# Patient Record
Sex: Male | Born: 1935 | Race: White | Hispanic: No | Smoking: Never smoker
Health system: Southern US, Community
[De-identification: ages and names within clinical notes are randomized; demographics above are authoritative.]

## PROBLEM LIST (undated history)

## (undated) DIAGNOSIS — N289 Disorder of kidney and ureter, unspecified: Secondary | ICD-10-CM

## (undated) DIAGNOSIS — E039 Hypothyroidism, unspecified: Secondary | ICD-10-CM

## (undated) DIAGNOSIS — I10 Essential (primary) hypertension: Secondary | ICD-10-CM

## (undated) DIAGNOSIS — E569 Vitamin deficiency, unspecified: Secondary | ICD-10-CM

## (undated) DIAGNOSIS — N183 Chronic kidney disease, stage 3 unspecified: Secondary | ICD-10-CM

## (undated) DIAGNOSIS — K219 Gastro-esophageal reflux disease without esophagitis: Secondary | ICD-10-CM

## (undated) DIAGNOSIS — H9192 Unspecified hearing loss, left ear: Secondary | ICD-10-CM

## (undated) DIAGNOSIS — G629 Polyneuropathy, unspecified: Secondary | ICD-10-CM

## (undated) DIAGNOSIS — D631 Anemia in chronic kidney disease: Secondary | ICD-10-CM

## (undated) DIAGNOSIS — N400 Enlarged prostate without lower urinary tract symptoms: Secondary | ICD-10-CM

## (undated) DIAGNOSIS — G43909 Migraine, unspecified, not intractable, without status migrainosus: Secondary | ICD-10-CM

## (undated) DIAGNOSIS — M4800 Spinal stenosis, site unspecified: Secondary | ICD-10-CM

## (undated) DIAGNOSIS — D649 Anemia, unspecified: Secondary | ICD-10-CM

## (undated) DIAGNOSIS — N200 Calculus of kidney: Secondary | ICD-10-CM

## (undated) DIAGNOSIS — N189 Chronic kidney disease, unspecified: Secondary | ICD-10-CM

## (undated) DIAGNOSIS — D473 Essential (hemorrhagic) thrombocythemia: Secondary | ICD-10-CM

## (undated) DIAGNOSIS — H353190 Nonexudative age-related macular degeneration, unspecified eye, stage unspecified: Secondary | ICD-10-CM

## (undated) DIAGNOSIS — K5792 Diverticulitis of intestine, part unspecified, without perforation or abscess without bleeding: Secondary | ICD-10-CM

## (undated) DIAGNOSIS — E785 Hyperlipidemia, unspecified: Secondary | ICD-10-CM

## (undated) DIAGNOSIS — R011 Cardiac murmur, unspecified: Secondary | ICD-10-CM

## (undated) HISTORY — DX: Polyneuropathy, unspecified: G62.9

## (undated) HISTORY — DX: Spinal stenosis, site unspecified: M48.00

## (undated) HISTORY — DX: Disorder of kidney and ureter, unspecified: N28.9

## (undated) HISTORY — DX: Chronic kidney disease, stage 3 (moderate): N18.3

## (undated) HISTORY — DX: Chronic kidney disease, stage 3 unspecified: N18.30

## (undated) HISTORY — DX: Anemia, unspecified: D64.9

## (undated) HISTORY — DX: Essential (hemorrhagic) thrombocythemia: D47.3

## (undated) HISTORY — DX: Hypothyroidism, unspecified: E03.9

## (undated) HISTORY — DX: Benign prostatic hyperplasia without lower urinary tract symptoms: N40.0

## (undated) HISTORY — DX: Hyperlipidemia, unspecified: E78.5

## (undated) HISTORY — DX: Essential (primary) hypertension: I10

## (undated) HISTORY — DX: Unspecified hearing loss, left ear: H91.92

## (undated) HISTORY — DX: Anemia in chronic kidney disease: D63.1

## (undated) HISTORY — PX: NASAL CONCHA BULLOSA RESECTION: SHX2062

## (undated) HISTORY — DX: Cardiac murmur, unspecified: R01.1

## (undated) HISTORY — DX: Nonexudative age-related macular degeneration, unspecified eye, stage unspecified: H35.3190

## (undated) HISTORY — DX: Vitamin deficiency, unspecified: E56.9

## (undated) HISTORY — DX: Calculus of kidney: N20.0

## (undated) HISTORY — DX: Chronic kidney disease, unspecified: N18.9

## (undated) HISTORY — DX: Migraine, unspecified, not intractable, without status migrainosus: G43.909

## (undated) HISTORY — DX: Diverticulitis of intestine, part unspecified, without perforation or abscess without bleeding: K57.92

## (undated) HISTORY — DX: Gastro-esophageal reflux disease without esophagitis: K21.9

## (undated) HISTORY — PX: INGUINAL HERNIA REPAIR: SUR1180

---
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
## 1990-05-05 HISTORY — PX: LAPAROSCOPIC CHOLECYSTECTOMY: SUR755

## 1994-05-05 HISTORY — PX: CORONARY ARTERY BYPASS GRAFT: SHX141

## 1999-09-16 ENCOUNTER — Ambulatory Visit (HOSPITAL_COMMUNITY): Admission: RE | Admit: 1999-09-16 | Discharge: 1999-09-16 | Payer: Self-pay | Admitting: Gastroenterology

## 2003-02-24 ENCOUNTER — Ambulatory Visit (HOSPITAL_COMMUNITY): Admission: RE | Admit: 2003-02-24 | Discharge: 2003-02-24 | Payer: Self-pay | Admitting: Orthopaedic Surgery

## 2003-02-24 ENCOUNTER — Encounter: Payer: Self-pay | Admitting: Orthopaedic Surgery

## 2003-03-01 ENCOUNTER — Encounter: Admission: RE | Admit: 2003-03-01 | Discharge: 2003-03-01 | Payer: Self-pay | Admitting: Orthopaedic Surgery

## 2004-08-22 ENCOUNTER — Encounter: Admission: RE | Admit: 2004-08-22 | Discharge: 2004-08-22 | Payer: Self-pay | Admitting: Otolaryngology

## 2006-06-24 ENCOUNTER — Ambulatory Visit (HOSPITAL_COMMUNITY): Admission: RE | Admit: 2006-06-24 | Discharge: 2006-06-24 | Payer: Self-pay | Admitting: Gastroenterology

## 2006-08-26 ENCOUNTER — Ambulatory Visit (HOSPITAL_COMMUNITY): Admission: RE | Admit: 2006-08-26 | Discharge: 2006-08-26 | Payer: Self-pay | Admitting: Internal Medicine

## 2006-12-24 ENCOUNTER — Encounter (INDEPENDENT_AMBULATORY_CARE_PROVIDER_SITE_OTHER): Payer: Self-pay | Admitting: Surgery

## 2006-12-24 ENCOUNTER — Ambulatory Visit (HOSPITAL_BASED_OUTPATIENT_CLINIC_OR_DEPARTMENT_OTHER): Admission: RE | Admit: 2006-12-24 | Discharge: 2006-12-24 | Payer: Self-pay | Admitting: Surgery

## 2007-10-14 ENCOUNTER — Encounter: Admission: RE | Admit: 2007-10-14 | Discharge: 2008-01-12 | Payer: Self-pay | Admitting: Internal Medicine

## 2007-10-28 ENCOUNTER — Ambulatory Visit (HOSPITAL_COMMUNITY): Admission: RE | Admit: 2007-10-28 | Discharge: 2007-10-28 | Payer: Self-pay | Admitting: Internal Medicine

## 2008-06-19 ENCOUNTER — Encounter: Admission: RE | Admit: 2008-06-19 | Discharge: 2008-06-19 | Payer: Self-pay | Admitting: Neurology

## 2008-07-25 HISTORY — PX: TRANSTHORACIC ECHOCARDIOGRAM: SHX275

## 2009-07-23 ENCOUNTER — Encounter: Admission: RE | Admit: 2009-07-23 | Discharge: 2009-07-23 | Payer: Self-pay | Admitting: Gastroenterology

## 2009-08-09 ENCOUNTER — Encounter: Admission: RE | Admit: 2009-08-09 | Discharge: 2009-08-09 | Payer: Self-pay | Admitting: Nephrology

## 2009-08-14 ENCOUNTER — Ambulatory Visit: Payer: Self-pay | Admitting: Oncology

## 2009-08-22 ENCOUNTER — Encounter: Admission: RE | Admit: 2009-08-22 | Discharge: 2009-08-22 | Payer: Self-pay | Admitting: Urology

## 2009-10-08 ENCOUNTER — Ambulatory Visit (HOSPITAL_COMMUNITY): Admission: RE | Admit: 2009-10-08 | Discharge: 2009-10-08 | Payer: Self-pay | Admitting: Interventional Radiology

## 2009-10-17 ENCOUNTER — Encounter (INDEPENDENT_AMBULATORY_CARE_PROVIDER_SITE_OTHER): Payer: Self-pay | Admitting: Interventional Radiology

## 2009-10-17 ENCOUNTER — Ambulatory Visit (HOSPITAL_COMMUNITY): Admission: RE | Admit: 2009-10-17 | Discharge: 2009-10-18 | Payer: Self-pay | Admitting: Interventional Radiology

## 2009-10-19 ENCOUNTER — Ambulatory Visit: Payer: Self-pay | Admitting: Oncology

## 2009-11-26 ENCOUNTER — Ambulatory Visit (HOSPITAL_COMMUNITY): Admission: RE | Admit: 2009-11-26 | Discharge: 2009-11-26 | Payer: Self-pay | Admitting: Interventional Radiology

## 2009-11-28 ENCOUNTER — Encounter: Admission: RE | Admit: 2009-11-28 | Discharge: 2009-11-28 | Payer: Self-pay | Admitting: Interventional Radiology

## 2010-02-14 HISTORY — PX: NM MYOCAR PERF WALL MOTION: HXRAD629

## 2010-02-18 ENCOUNTER — Encounter: Admission: RE | Admit: 2010-02-18 | Discharge: 2010-02-18 | Payer: Self-pay | Admitting: Cardiovascular Disease

## 2010-02-19 ENCOUNTER — Ambulatory Visit (HOSPITAL_COMMUNITY): Admission: RE | Admit: 2010-02-19 | Discharge: 2010-02-19 | Payer: Self-pay | Admitting: Cardiovascular Disease

## 2010-02-19 HISTORY — PX: CARDIAC CATHETERIZATION: SHX172

## 2010-04-04 HISTORY — PX: OTHER SURGICAL HISTORY: SHX169

## 2010-05-25 ENCOUNTER — Encounter: Payer: Self-pay | Admitting: Orthopaedic Surgery

## 2010-05-25 ENCOUNTER — Encounter: Payer: Self-pay | Admitting: Radiation Oncology

## 2010-05-26 ENCOUNTER — Encounter: Payer: Self-pay | Admitting: Interventional Radiology

## 2010-07-17 LAB — POCT I-STAT 3, ART BLOOD GAS (G3+)
Bicarbonate: 23.4 mEq/L (ref 20.0–24.0)
pCO2 arterial: 40.4 mmHg (ref 35.0–45.0)
pH, Arterial: 7.371 (ref 7.350–7.450)
pO2, Arterial: 62 mmHg — ABNORMAL LOW (ref 80.0–100.0)

## 2010-07-17 LAB — POCT I-STAT 3, VENOUS BLOOD GAS (G3P V)
Acid-base deficit: 1 mmol/L (ref 0.0–2.0)
Bicarbonate: 24.8 mEq/L — ABNORMAL HIGH (ref 20.0–24.0)
O2 Saturation: 62 %
pCO2, Ven: 44.5 mmHg — ABNORMAL LOW (ref 45.0–50.0)
pH, Ven: 7.354 — ABNORMAL HIGH (ref 7.250–7.300)
pO2, Ven: 34 mmHg (ref 30.0–45.0)

## 2010-07-21 LAB — CBC
MCHC: 34.8 g/dL (ref 30.0–36.0)
MCV: 91.8 fL (ref 78.0–100.0)
Platelets: 329 10*3/uL (ref 150–400)
RDW: 13.1 % (ref 11.5–15.5)

## 2010-07-21 LAB — CROSSMATCH: Antibody Screen: NEGATIVE

## 2010-07-21 LAB — BASIC METABOLIC PANEL
Chloride: 108 mEq/L (ref 96–112)
Creatinine, Ser: 1.49 mg/dL (ref 0.4–1.5)
GFR calc Af Amer: 54 mL/min — ABNORMAL LOW (ref >60)
Potassium: 4.5 mEq/L (ref 3.5–5.1)

## 2010-07-22 LAB — CROSSMATCH: Antibody Screen: NEGATIVE

## 2010-07-22 LAB — BASIC METABOLIC PANEL
Calcium: 9.5 mg/dL (ref 8.4–10.5)
Chloride: 109 mEq/L (ref 96–112)
Creatinine, Ser: 1.38 mg/dL (ref 0.4–1.5)
GFR calc non Af Amer: 49 mL/min — ABNORMAL LOW (ref >60)
Glucose, Bld: 99 mg/dL (ref 70–99)
Potassium: 4.5 mEq/L (ref 3.5–5.1)
Sodium: 142 mEq/L (ref 135–145)

## 2010-07-22 LAB — CBC
Hemoglobin: 14.7 g/dL (ref 13.0–17.0)
RBC: 4.68 MIL/uL (ref 4.22–5.81)
RDW: 13.9 % (ref 11.5–15.5)
WBC: 6.6 10*3/uL (ref 4.0–10.5)

## 2010-07-22 LAB — ABO/RH: ABO/RH(D): A POS

## 2010-08-30 ENCOUNTER — Other Ambulatory Visit: Payer: Self-pay | Admitting: Neurology

## 2010-08-30 DIAGNOSIS — W19XXXA Unspecified fall, initial encounter: Secondary | ICD-10-CM

## 2010-08-30 DIAGNOSIS — G722 Myopathy due to other toxic agents: Secondary | ICD-10-CM

## 2010-08-30 DIAGNOSIS — I25810 Atherosclerosis of coronary artery bypass graft(s) without angina pectoris: Secondary | ICD-10-CM

## 2010-08-30 DIAGNOSIS — I639 Cerebral infarction, unspecified: Secondary | ICD-10-CM

## 2010-09-04 ENCOUNTER — Ambulatory Visit
Admission: RE | Admit: 2010-09-04 | Discharge: 2010-09-04 | Disposition: A | Discharge status: Home / Self Care | Payer: Medicare Other | Source: Ambulatory Visit | Attending: Neurology | Admitting: Neurology

## 2010-09-04 DIAGNOSIS — I25810 Atherosclerosis of coronary artery bypass graft(s) without angina pectoris: Secondary | ICD-10-CM

## 2010-09-04 DIAGNOSIS — W19XXXA Unspecified fall, initial encounter: Secondary | ICD-10-CM

## 2010-09-04 DIAGNOSIS — G722 Myopathy due to other toxic agents: Secondary | ICD-10-CM

## 2010-09-17 NOTE — Op Note (Signed)
NAME:  Alexander [name], Alexander [name] NO.:  [id number]   MEDICAL RECORD NO.:  [id number]          PATIENT TYPE:  AMB   LOCATION:  DSC                          FACILITY:  [facility]   PHYSICIAN:  Fenton Malling. Lucia Gaskins, M.D.  DATE OF BIRTH:  12/24/1921   DATE OF PROCEDURE:  12/24/2006  DATE OF DISCHARGE:                               OPERATIVE REPORT   PREOPERATIVE DIAGNOSES:  1. An 8-mm lesion on the left neck.  2. A 2-cm lipoma, left upper back.  3. A 2-cm sebaceous cyst below lipoma about 6 cm.   POSTOPERATIVE DIAGNOSES:  1. An 8-mm lesion on the left neck.  2. A 2-cm lipoma, left upper back.  3. A 2-cm sebaceous cyst below lipoma about 6 cm.   PROCEDURE:  Excision of lesions; left neck, left upper back, left mid  back.   SURGEON:  Fenton Malling. Lucia Gaskins, M.D.   ANESTHESIA:  Xylocaine 1% 12 mL with epinephrine.   COMPLICATIONS:  None.   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  He now comes for  excision of these lesions.   OPERATIVE NOTE:  Patient in the prone position, his back prepped with Betadine solution  and sterilely draped, I infiltrated all 3 lesions using a total of about  12 mL of 1% Xylocaine.  I excised these lesions in turn in its entirety.  I closed the lesions with 3-0 nylon sutures using interrupted vertical  mattress suture.  Each wound was then dressed.   The patient will see me back in 8-10 days for suture removal, call for  any interval problems.  He is to take some Tylenol or aspirin at home  for pain.      Fenton Malling. Lucia Gaskins, M.D.  Electronically Signed    DHN/MEDQ  D:  12/24/2006  T:  12/24/2006  Job:  [id number]   cc:   Elta Guadeloupe A. Perini, M.D.

## 2010-09-20 NOTE — Procedures (Signed)
[facility]  Patient:    Alexander [name], Alexander [name]                        MRN: [id number] Proc. Date: 09/16/99 Adm. Date:  [id number] Disc. Date: [id number] Attending:  [name] CC:         Kizzie Furnish, M.D., [address] [place]  [address]                           Procedure Report  PROCEDURE:  Panendoscopy.  ENDOSCOPIST:  Mayme Genta, M.D.  INDICATION:  [age]-year-old white male -- the patient is my father -- undergoing endoscopy to evaluate symptoms of chronic reflux, typically relieved with antacids, H2 blockers or PPI therapy.  Early satiety.  Symptoms recurrent off medication.  No prior upper GI evaluation.  DESCRIPTION OF PROCEDURE:  Immediately following colonoscopy, upper endoscopy was performed.  Additional sedation with Versed 1 mg and Fentanyl 10 mcg, administered IV in divided doses.  Topical anesthetic applied to the posterior oropharynx.  Using an Olympus videoendoscope, proximal esophagus was intubated under direct vision.  The oropharynx was closely inspected and appeared normal.  The vocal cords, arytenoids, piriform sinuses all normal.  Upper esophageal sphincter was easily traversed.  The proximal, mid and distal segments of the esophagus were normal.  An early sigmoid configuration of the esophagus was appreciated. The mucosal Z-line was distinct at 35 cm, with evidence of Barretts metaplasia.  No erosion or ulceration noted.  No suggestion of significant reflux disease.  A large hiatal hernia was measured from 35 to 42 cm, approximately the proximal third of the stomach residing in an intrathoracic position.  Along the diaphragmatic crus, early mild Cameron erosions were identified with surrounding mucosal erythema.  No ulceration.  No nodularity.  The gastric fundus, body and antrum were normal.  Pylorus was symmetric.  Duodenal bulb and second portion normal.  Retroflexed view of the angularis, lesser  curve, gastric cardia and fundus confirmed the above findings without additional lesion being noted.  Stomach was decompressed and scope withdrawn.  The patient tolerated the procedure without difficulty.  Occasional run of SVT was again noted during endoscopy but without hemodynamic compromise.  These were short-lived, typically lasting less than 10 seconds.  Returned to recovery in stable condition, having been maintained on low-flow oxygen and datascope monitor throughout.  ASSESSMENT: 1. Gastroesophageal acid reflux -- no evidence of esophagitis. 2. Large hiatal hernia with secondary sigmoid esophagus. 3. Cameron erosions with mild mucosal inflammation.  RECOMMENDATION: 1. Daily PPI therapy. 2. Antireflux measures. DD:  09/16/99 TD:  09/18/99 Job: [id number]

## 2010-09-20 NOTE — Procedures (Signed)
[facility]  Patient:    Alexander [name], Alexander [name]                        MRN: [id number] Proc. Date: 09/16/99 Adm. Date:  [id number] Disc. Date: [id number] Attending:  [name] CC:         [name], [place] [id number]                           Procedure Report  PROCEDURE PERFORMED:  Colonoscopy.  ENDOSCOPIST:  Mayme Genta, M.D.  INDICATIONS FOR PROCEDURE:  The patient is a 77 year old white male, father, undergoing colonoscopy to evaluate hematochezia.  Last examined approximately six years ago by Dr. Verl Blalock at which time diverticulosis was present.  In recent weeks with intermittent bright red blood per rectum.  Typically postdefecation, noted on cleansing tissue.  No change in bowel habits.  Denies change in stool caliber.  Mild perianal discomfort suggesting probable anorectal source.  No interim colorectal neoplasia surveillance.  Undergoing colonoscopy o further diagnose etiology and for surveillance.  DESCRIPTION OF PROCEDURE:  After reviewing the nature of the procedure with the  patient including potential risks and complications, informed consent was signed. The patient was premedicated receiving IV sedation totaling Versed 5 mg, fentanyl 50 mcg administered IV in divided doses prior to the onset of the procedure. Shortly after administration of medication, intermittent runs of a supraventricular tachycardia with maximal ventricular response of 146/minute noted.  Blood pressure remained stable throughout.  These subsided with initiation of the procedure.  Using an Olympus pediatric PCF-140L video colonoscope, rectum was intubated after digital examination.  The prostate is normal in size, smooth without nodularity. No other perianal or intrarectal pathology appreciated.  Scope was inserted and advanced around the entire length of the colon without difficulty.  Preparation was  excellent throughout.  The cecum was identified by the appendiceal orifice and ileocecal valve.  The terminal ileum was intubated and examined over its distal 5 cm.  The mucosa was normal without evidence of inflammation.  The scope was slowly withdrawn with careful inspection of the entire colon in a retrograde manner.  Visualization was excellent throughout.  Moderate sigmoid diverticulosis was noted.  No evidence f acute inflammation.  The rectal vault appeared normal.  Retroflex view revealed  early, noninflamed internal hemorrhoids.  These were the probable source of hematochezia.  The colon was decompressed and scope withdrawn.  The patient had no evidence of neoplasia.  The mucosa was intact throughout. no vascular lesion or other finding of significance.  Time 1, technical 1, preparation 1, total score 3.  ASSESSMENT: 1. Sigmoid diverticulosis--moderate. 2. Internal hemorrhoids--mild, probable source of hematochezia. 3. Supraventricular tachycardia noted on cardiac monitoring during the course of    the procedure. DD:  09/16/99 TD:  09/18/99 Job: [id number]

## 2010-11-13 ENCOUNTER — Other Ambulatory Visit: Payer: Self-pay | Admitting: Interventional Radiology

## 2010-11-13 ENCOUNTER — Other Ambulatory Visit (HOSPITAL_COMMUNITY): Payer: Self-pay | Admitting: Interventional Radiology

## 2010-11-13 DIAGNOSIS — N289 Disorder of kidney and ureter, unspecified: Secondary | ICD-10-CM

## 2010-11-29 ENCOUNTER — Other Ambulatory Visit (HOSPITAL_COMMUNITY): Payer: Self-pay | Admitting: Interventional Radiology

## 2010-11-29 ENCOUNTER — Ambulatory Visit (HOSPITAL_COMMUNITY)
Admission: RE | Admit: 2010-11-29 | Discharge: 2010-11-29 | Disposition: A | Discharge status: Home / Self Care | Payer: Medicare Other | Source: Ambulatory Visit | Attending: Interventional Radiology | Admitting: Interventional Radiology

## 2010-11-29 DIAGNOSIS — K449 Diaphragmatic hernia without obstruction or gangrene: Secondary | ICD-10-CM | POA: Insufficient documentation

## 2010-11-29 DIAGNOSIS — N289 Disorder of kidney and ureter, unspecified: Secondary | ICD-10-CM

## 2010-11-29 DIAGNOSIS — N281 Cyst of kidney, acquired: Secondary | ICD-10-CM | POA: Insufficient documentation

## 2010-12-02 ENCOUNTER — Other Ambulatory Visit: Payer: Self-pay | Admitting: Endocrinology

## 2010-12-03 ENCOUNTER — Ambulatory Visit
Admission: RE | Admit: 2010-12-03 | Discharge: 2010-12-03 | Disposition: A | Discharge status: Home / Self Care | Payer: Medicare Other | Source: Ambulatory Visit | Attending: Interventional Radiology | Admitting: Interventional Radiology

## 2010-12-03 VITALS — BP 116/61 | HR 61 | Temp 97.8°F | Resp 16

## 2010-12-03 DIAGNOSIS — N289 Disorder of kidney and ureter, unspecified: Secondary | ICD-10-CM

## 2010-12-04 NOTE — Progress Notes (Signed)
Denies hematuria or other urinary problems.  Denies flank pain.

## 2011-01-14 ENCOUNTER — Encounter: Payer: Medicare Other | Admitting: Physical Therapy

## 2011-01-15 ENCOUNTER — Ambulatory Visit: Payer: Medicare Other | Attending: Neurology | Admitting: Physical Therapy

## 2011-01-15 DIAGNOSIS — IMO0001 Reserved for inherently not codable concepts without codable children: Secondary | ICD-10-CM | POA: Insufficient documentation

## 2011-01-15 DIAGNOSIS — R269 Unspecified abnormalities of gait and mobility: Secondary | ICD-10-CM | POA: Insufficient documentation

## 2011-01-20 ENCOUNTER — Ambulatory Visit: Payer: Medicare Other | Admitting: Physical Therapy

## 2011-01-27 ENCOUNTER — Ambulatory Visit: Payer: Medicare Other | Admitting: Physical Therapy

## 2011-02-03 ENCOUNTER — Other Ambulatory Visit (HOSPITAL_COMMUNITY): Payer: Self-pay | Admitting: Gastroenterology

## 2011-02-03 ENCOUNTER — Ambulatory Visit (HOSPITAL_COMMUNITY)
Admission: RE | Admit: 2011-02-03 | Discharge: 2011-02-03 | Disposition: A | Discharge status: Home / Self Care | Payer: Medicare Other | Source: Ambulatory Visit | Attending: Gastroenterology | Admitting: Gastroenterology

## 2011-02-03 ENCOUNTER — Other Ambulatory Visit (HOSPITAL_COMMUNITY): Payer: Medicare Other

## 2011-02-03 ENCOUNTER — Other Ambulatory Visit: Payer: Self-pay | Admitting: Urology

## 2011-02-03 ENCOUNTER — Ambulatory Visit (HOSPITAL_COMMUNITY): Payer: Medicare Other

## 2011-02-03 DIAGNOSIS — K449 Diaphragmatic hernia without obstruction or gangrene: Secondary | ICD-10-CM | POA: Insufficient documentation

## 2011-02-03 DIAGNOSIS — C61 Malignant neoplasm of prostate: Secondary | ICD-10-CM

## 2011-02-03 DIAGNOSIS — Z9089 Acquired absence of other organs: Secondary | ICD-10-CM | POA: Insufficient documentation

## 2011-02-03 DIAGNOSIS — N139 Obstructive and reflux uropathy, unspecified: Secondary | ICD-10-CM | POA: Insufficient documentation

## 2011-02-10 ENCOUNTER — Ambulatory Visit: Payer: Medicare Other | Attending: Neurology | Admitting: Physical Therapy

## 2011-02-10 DIAGNOSIS — R269 Unspecified abnormalities of gait and mobility: Secondary | ICD-10-CM | POA: Insufficient documentation

## 2011-02-10 DIAGNOSIS — IMO0001 Reserved for inherently not codable concepts without codable children: Secondary | ICD-10-CM | POA: Insufficient documentation

## 2011-02-14 ENCOUNTER — Ambulatory Visit: Payer: Medicare Other | Admitting: Physical Therapy

## 2011-02-19 ENCOUNTER — Encounter: Payer: Medicare Other | Admitting: Physical Therapy

## 2011-02-19 ENCOUNTER — Ambulatory Visit: Payer: Medicare Other | Admitting: Physical Therapy

## 2011-02-24 ENCOUNTER — Ambulatory Visit: Payer: Medicare Other | Admitting: Physical Therapy

## 2011-03-03 ENCOUNTER — Encounter: Payer: Self-pay | Admitting: Critical Care Medicine

## 2011-03-04 ENCOUNTER — Ambulatory Visit (INDEPENDENT_AMBULATORY_CARE_PROVIDER_SITE_OTHER): Payer: Medicare Other | Admitting: Critical Care Medicine

## 2011-03-04 ENCOUNTER — Encounter: Payer: Self-pay | Admitting: Critical Care Medicine

## 2011-03-04 DIAGNOSIS — J45909 Unspecified asthma, uncomplicated: Secondary | ICD-10-CM

## 2011-03-04 DIAGNOSIS — R0602 Shortness of breath: Secondary | ICD-10-CM

## 2011-03-04 DIAGNOSIS — N400 Enlarged prostate without lower urinary tract symptoms: Secondary | ICD-10-CM

## 2011-03-04 DIAGNOSIS — N183 Chronic kidney disease, stage 3 unspecified: Secondary | ICD-10-CM | POA: Insufficient documentation

## 2011-03-04 DIAGNOSIS — N200 Calculus of kidney: Secondary | ICD-10-CM | POA: Insufficient documentation

## 2011-03-04 DIAGNOSIS — R011 Cardiac murmur, unspecified: Secondary | ICD-10-CM

## 2011-03-04 DIAGNOSIS — K219 Gastro-esophageal reflux disease without esophagitis: Secondary | ICD-10-CM

## 2011-03-04 DIAGNOSIS — G629 Polyneuropathy, unspecified: Secondary | ICD-10-CM

## 2011-03-04 DIAGNOSIS — I10 Essential (primary) hypertension: Secondary | ICD-10-CM

## 2011-03-04 DIAGNOSIS — G43909 Migraine, unspecified, not intractable, without status migrainosus: Secondary | ICD-10-CM

## 2011-03-04 DIAGNOSIS — M4800 Spinal stenosis, site unspecified: Secondary | ICD-10-CM

## 2011-03-04 DIAGNOSIS — E785 Hyperlipidemia, unspecified: Secondary | ICD-10-CM

## 2011-03-04 DIAGNOSIS — J309 Allergic rhinitis, unspecified: Secondary | ICD-10-CM | POA: Insufficient documentation

## 2011-03-04 MED ORDER — BUDESONIDE 180 MCG/ACT IN AEPB: 2.0000 | INHALATION_SPRAY | Freq: Two times a day (BID) | RESPIRATORY_TRACT | Status: DC

## 2011-03-04 MED ORDER — ALBUTEROL 90 MCG/ACT IN AERS: 2.0000 | INHALATION_SPRAY | Freq: Four times a day (QID) | RESPIRATORY_TRACT | Status: DC | PRN

## 2011-03-04 NOTE — Patient Instructions (Signed)
Start Pulmicort two puff twice daily Use ventolin 1-2 puff every 4hours as needed Return 3 weeks

## 2011-03-04 NOTE — Progress Notes (Signed)
Subjective:    Patient ID: Alexander [name], male    DOB: 06-27-1921, 77 y.o.   MRN: [id number]  HPI Comments: Onset of dyspnea.  When trying to sing could not sustain a breath.  WOuld have to stop to take a breath. If laugh, would get into a spasm and hard to continue without coughing   Started 1.47yrs ago.   Shortness of Breath This is a chronic problem. The current episode started more than 1 year ago. The problem occurs daily (Walks on level ground ok, but goes up a flight of stairs and if gets t o the top is dyspneic.). The problem has been gradually worsening. Duration: Will recover in a few minutes. Associated symptoms include abdominal pain and rhinorrhea. Pertinent negatives include no chest pain, ear pain, fever, headaches, hemoptysis, leg pain, leg swelling, neck pain, orthopnea, PND, rash, sore throat, sputum production, swollen glands, syncope, vomiting or wheezing. The symptoms are aggravated by any activity. Associated symptoms comments: occ epigastric discomfort , has a large hiatal hernia, progressively larger over many years  Has had daily reflux but now on aciphex this helps. He has tried nothing for the symptoms. His past medical history is significant for allergies and CAD. There is no history of aspirin allergies, asthma, bronchiolitis, chronic lung disease, COPD, DVT, a heart failure, PE, pneumonia or a recent surgery. (Seasonal hayfever as teenage>>>ragweed)  Cough This is a chronic problem. The current episode started more than 1 year ago. The problem has been unchanged. Episode frequency: cough only with laughing or intermittent. The cough is non-productive. Associated symptoms include nasal congestion, postnasal drip, rhinorrhea and shortness of breath. Pertinent negatives include no chest pain, chills, ear congestion, ear pain, fever, headaches, hemoptysis, myalgias, rash, sore throat, sweats, weight loss or wheezing. Exacerbated by: laughing ,  deep breath. He has tried nothing  for the symptoms. His past medical history is significant for environmental allergies. There is no history of asthma, bronchiectasis, bronchitis, COPD, emphysema or pneumonia. seasonal hayfever as teenage>>>ragweed   Past Medical History  Diagnosis Date  . Hypertension   . Hyperlipidemia   . GERD (gastroesophageal reflux disease)   . Spinal stenosis   . Migraines     OCULAR  . BPH (benign prostatic hyperplasia)   . CKD (chronic kidney disease), stage III   . Vitamin deficiency   . Diverticulitis   . Heart murmur   . Calcium oxalate renal stones   . Renal lesion   . Dry senile macular degeneration   . Allergic rhinitis   . Anemia   . Peripheral neuropathy      Family History  Problem Relation Age of Onset  . Allergies Daughter   . Asthma Grandchild   . Heart failure Mother   . Coronary artery disease Maternal Grandfather      History   Social History  . Marital Status: Widowed    Spouse Name: N/A    Number of Children: 3  . Years of Education: N/A   Occupational History  . Not on file.   Social History Main Topics  . Smoking status: Never Smoker   . Smokeless tobacco: Never Used  . Alcohol Use: No  . Drug Use: No  . Sexually Active: Not on file   Other Topics Concern  . Not on file   Social History Narrative  . No narrative on file     No Known Allergies   Outpatient Prescriptions Prior to Visit  Medication Sig Dispense Refill  .  aluminum & magnesium hydroxide-simethicone (MYLANTA) 500-450-40 MG/5ML suspension Take by mouth every 6 (six) hours as needed. 200-200-20mg /76ml       . aspirin 81 MG tablet Take 81 mg by mouth daily.        [name] losartan (COZAAR) 25 MG tablet Take 25 mg by mouth daily.        . RABEprazole (ACIPHEX) 20 MG tablet Take 20 mg by mouth daily.        . rosuvastatin (CRESTOR) 10 MG tablet Take 10 mg by mouth daily.        . calcium carbonate (TITRALAC) 420 MG CHEW Chew by mouth.        . cyanocobalamin 1000 MCG tablet        .  ranitidine (ZANTAC) 75 MG tablet Take 75 mg by mouth as needed.        . zoledronic acid (RECLAST) 5 MG/100ML SOLN Inject 5 mg into the vein once.             Review of Systems  Constitutional: Negative for fever, chills, weight loss, diaphoresis, activity change, appetite change, fatigue and unexpected weight change.  HENT: Positive for hearing loss, congestion, rhinorrhea, sneezing, voice change and postnasal drip. Negative for ear pain, nosebleeds, sore throat, facial swelling, mouth sores, trouble swallowing, neck pain, neck stiffness, dental problem, sinus pressure, tinnitus and ear discharge.        No dysphagia No sore throat Is hoarse  Eyes: Positive for itching. Negative for photophobia, discharge and visual disturbance.  Respiratory: Positive for cough and shortness of breath. Negative for apnea, hemoptysis, sputum production, choking, chest tightness, wheezing and stridor.   Cardiovascular: Negative for chest pain, palpitations, orthopnea, leg swelling, syncope and PND.  Gastrointestinal: Positive for abdominal pain. Negative for nausea, vomiting, constipation, blood in stool and abdominal distention.  Genitourinary: Negative for dysuria, urgency, frequency, hematuria, flank pain, decreased urine volume and difficulty urinating.  Musculoskeletal: Positive for gait problem. Negative for myalgias, back pain, joint swelling and arthralgias.       Balance issue   Skin: Negative for color change, pallor and rash.  Neurological: Positive for weakness and numbness. Negative for dizziness, tremors, seizures, syncope, speech difficulty, light-headedness and headaches.  Hematological: Positive for environmental allergies. Negative for adenopathy. Does not bruise/bleed easily.  Psychiatric/Behavioral: Negative for confusion, sleep disturbance and agitation. The patient is not nervous/anxious.        Objective:   Physical Exam Filed Vitals:   03/04/11 1602  BP: 130/78  Pulse: 57    Temp: 97.9 F (36.6 C)  TempSrc: Oral  Height: 5' 6.5" (1.689 m)  Weight: 148 lb 6.4 oz (67.314 kg)  SpO2: 96%    Gen: Pleasant, well-nourished, in no distress,  normal affect  ENT: No lesions,  mouth clear,  oropharynx clear, no postnasal drip  Neck: No JVD, no TMG, no carotid bruits  Lungs: No use of accessory muscles, no dullness to percussion, exp wheezes, poor airflow  Cardiovascular: RRR, heart sounds normal, no murmur or gallops, no peripheral edema  Abdomen: soft and NT, no HSM,  BS normal  Musculoskeletal: No deformities, no cyanosis or clubbing  Neuro: alert, non focal  Skin: Warm, no lesions or rashes   CXR 02/03/11 Large hiatal hernia, no active lung disease  Spirometry: FEV1 69% FEF 25- 75  51% on 03/04/2011     Assessment & Plan:   Extrinsic asthma, unspecified Moderate persistent asthma with lower airway inflammation and airway obstruction on spirometry Likely etiologies include  hypersensitivity from environmental allergens as well as microaspiration from high level reflux to severe gastric hiatal hernia Plan Begin inhaled steroid with Pulmicort 2 puffs twice daily Use Ventolin inhaler as needed No indication for systemic steroids at this time    Updated Medication List Outpatient Encounter Prescriptions as of 03/04/2011  Medication Sig Dispense Refill  . aluminum & magnesium hydroxide-simethicone (MYLANTA) 500-450-40 MG/5ML suspension Take by mouth every 6 (six) hours as needed. 200-200-20mg /12ml       . aspirin 81 MG tablet Take 81 mg by mouth daily.        . cholecalciferol (VITAMIN D) 1000 UNITS tablet Take 1,000 Units by mouth daily.        . Coenzyme Q10 (CO Q10) 200 MG CAPS Take 1 capsule by mouth daily.        . Cyanocobalamin (VITAMIN B-12 IJ) Inject as directed. monthly       . Docusate Calcium (STOOL SOFTENER PO) Take 1 capsule by mouth 3 (three) times daily.        . finasteride (PROSCAR) 5 MG tablet Take 1 tablet by mouth Daily.       [name] losartan (COZAAR) 25 MG tablet Take 25 mg by mouth daily.        . LUTEIN PO Take by mouth daily.        . metoprolol tartrate (LOPRESSOR) 25 MG tablet Take 1 tablet by mouth Twice daily.      . Multiple Vitamin (MULTIVITAMIN) tablet Take 1 tablet by mouth daily.        . Multiple Vitamins-Minerals (PRESERVISION AREDS PO) Take 1 capsule by mouth 2 (two) times daily.        [name] NITROSTAT 0.4 MG SL tablet as directed.      . RABEprazole (ACIPHEX) 20 MG tablet Take 20 mg by mouth daily.        . rosuvastatin (CRESTOR) 10 MG tablet Take 10 mg by mouth daily.        [name] zolpidem (AMBIEN) 5 MG tablet Take 5 mg by mouth at bedtime.        [name] albuterol (PROVENTIL,VENTOLIN) 90 MCG/ACT inhaler Inhale 2 puffs into the lungs every 6 (six) hours as needed for wheezing.  17 g  12  . budesonide (PULMICORT FLEXHALER) 180 MCG/ACT inhaler Inhale 2 puffs into the lungs 2 (two) times daily.  1 each  5  . DISCONTD: calcium carbonate (TITRALAC) 420 MG CHEW Chew by mouth.        . DISCONTD: cyanocobalamin 1000 MCG tablet        . DISCONTD: ranitidine (ZANTAC) 75 MG tablet Take 75 mg by mouth as needed.        [name] DISCONTD: zoledronic acid (RECLAST) 5 MG/100ML SOLN Inject 5 mg into the vein once.

## 2011-03-05 ENCOUNTER — Ambulatory Visit: Payer: Medicare Other | Admitting: Physical Therapy

## 2011-03-05 ENCOUNTER — Telehealth: Payer: Self-pay | Admitting: Critical Care Medicine

## 2011-03-05 DIAGNOSIS — J4540 Moderate persistent asthma, uncomplicated: Secondary | ICD-10-CM | POA: Insufficient documentation

## 2011-03-05 NOTE — Telephone Encounter (Signed)
[name] faxed copy of OV note from yesterday.

## 2011-03-05 NOTE — Assessment & Plan Note (Signed)
Moderate persistent asthma with lower airway inflammation and airway obstruction on spirometry Likely etiologies include hypersensitivity from environmental allergens as well as microaspiration from high level reflux to severe gastric hiatal hernia Plan Begin inhaled steroid with Pulmicort 2 puffs twice daily Use Ventolin inhaler as needed No indication for systemic steroids at this time

## 2011-03-05 NOTE — Telephone Encounter (Signed)
Crystal are you familiar with what was faxed and do you still have it where I can re fax to the dr office. Thanks.

## 2011-03-05 NOTE — Telephone Encounter (Signed)
I have re-faxed the OV notes from yesterday to the given number and fax went through; I left a message for patient to call us if he did not get the notes. Phone message complete.

## 2011-03-25 ENCOUNTER — Encounter: Payer: Self-pay | Admitting: Critical Care Medicine

## 2011-03-25 ENCOUNTER — Ambulatory Visit (INDEPENDENT_AMBULATORY_CARE_PROVIDER_SITE_OTHER): Payer: Medicare Other | Admitting: Critical Care Medicine

## 2011-03-25 DIAGNOSIS — J45909 Unspecified asthma, uncomplicated: Secondary | ICD-10-CM

## 2011-03-25 DIAGNOSIS — R059 Cough, unspecified: Secondary | ICD-10-CM

## 2011-03-25 DIAGNOSIS — R05 Cough: Secondary | ICD-10-CM

## 2011-03-25 NOTE — Progress Notes (Signed)
Subjective:    Patient ID: Alexander [name], male    DOB: [birth year], 77 y.o.   MRN: [id number]  HPI Comments: Onset of dyspnea.  When trying to sing could not sustain a breath.  WOuld have to stop to take a breath. If laugh, would get into a spasm and hard to continue without coughing   Started 1.39yrs ago.   Shortness of Breath This is a chronic problem. The current episode started more than 1 year ago. The problem occurs daily (.). The problem has been gradually improving. Duration: Will recover in a few minutes. Associated symptoms include abdominal pain and rhinorrhea. Pertinent negatives include no chest pain, ear pain, fever, headaches, hemoptysis, leg pain, leg swelling, neck pain, orthopnea, PND, rash, sore throat, sputum production, swollen glands, syncope, vomiting or wheezing. The symptoms are aggravated by any activity. Associated symptoms comments: occ epigastric discomfort , has a large hiatal hernia, progressively larger over many years  Has had daily reflux but now on aciphex this helps. He has tried nothing for the symptoms. His past medical history is significant for allergies and CAD. There is no history of aspirin allergies, asthma, bronchiolitis, chronic lung disease, COPD, DVT, a heart failure, PE, pneumonia or a recent surgery. (Seasonal hayfever as teenage>>>ragweed)  Cough This is a chronic problem. The current episode started more than 1 year ago. The problem has been gradually improving. Episode frequency: cough only with laughing or intermittent. The cough is non-productive. Associated symptoms include nasal congestion, postnasal drip, rhinorrhea and shortness of breath. Pertinent negatives include no chest pain, chills, ear congestion, ear pain, fever, headaches, hemoptysis, myalgias, rash, sore throat, sweats, weight loss or wheezing. Exacerbated by: laughing ,  deep breath. He has tried nothing for the symptoms. His past medical history is significant for environmental  allergies. There is no history of asthma, bronchiectasis, bronchitis, COPD, emphysema or pneumonia. seasonal hayfever as teenage>>>ragweed   03/25/2011 At last ov we rec: pulmicort two puff bid. Cough is better , is not as intense. Still present.  Deep breathing is improved.  Speech still impaired, still not the breath without stopping.  Notes some hoarseness. Past Medical History  Diagnosis Date  . Hypertension   . Hyperlipidemia   . GERD (gastroesophageal reflux disease)   . Spinal stenosis   . Migraines     OCULAR  . BPH (benign prostatic hyperplasia)   . CKD (chronic kidney disease), stage III   . Vitamin deficiency   . Diverticulitis   . Heart murmur   . Calcium oxalate renal stones   . Renal lesion   . Dry senile macular degeneration   . Allergic rhinitis   . Anemia   . Peripheral neuropathy      Family History  Problem Relation Age of Onset  . Allergies Daughter   . Asthma Grandchild   . Heart failure Mother   . Coronary artery disease Maternal Grandfather      History   Social History  . Marital Status: Widowed    Spouse Name: N/A    Number of Children: 3  . Years of Education: N/A   Occupational History  . Not on file.   Social History Main Topics  . Smoking status: Never Smoker   . Smokeless tobacco: Never Used  . Alcohol Use: No  . Drug Use: No  . Sexually Active: Not on file   Other Topics Concern  . Not on file   Social History Narrative  . No narrative on file  No Known Allergies   Outpatient Prescriptions Prior to Visit  Medication Sig Dispense Refill  . albuterol (PROVENTIL,VENTOLIN) 90 MCG/ACT inhaler Inhale 2 puffs into the lungs every 6 (six) hours as needed for wheezing.  17 g  12  . aluminum & magnesium hydroxide-simethicone (MYLANTA) 500-450-40 MG/5ML suspension Take by mouth every 6 (six) hours as needed. 200-200-20mg /41ml       . aspirin 81 MG tablet Take 81 mg by mouth daily.        . budesonide (PULMICORT FLEXHALER) 180  MCG/ACT inhaler Inhale 2 puffs into the lungs 2 (two) times daily.  1 each  5  . cholecalciferol (VITAMIN D) 1000 UNITS tablet Take 1,000 Units by mouth daily.        . Coenzyme Q10 (CO Q10) 200 MG CAPS Take 1 capsule by mouth daily.        . Cyanocobalamin (VITAMIN B-12 IJ) Inject as directed. monthly       . Docusate Calcium (STOOL SOFTENER PO) Take 1 capsule by mouth 3 (three) times daily.        . finasteride (PROSCAR) 5 MG tablet Take 1 tablet by mouth Daily.      [name] losartan (COZAAR) 25 MG tablet Take 25 mg by mouth daily.        . LUTEIN PO Take by mouth daily.        . metoprolol tartrate (LOPRESSOR) 25 MG tablet Take 1 tablet by mouth Twice daily.      . Multiple Vitamin (MULTIVITAMIN) tablet Take 1 tablet by mouth daily.        . Multiple Vitamins-Minerals (PRESERVISION AREDS PO) Take 1 capsule by mouth 2 (two) times daily.        [name] NITROSTAT 0.4 MG SL tablet as directed.      . RABEprazole (ACIPHEX) 20 MG tablet Take 20 mg by mouth daily.        . rosuvastatin (CRESTOR) 10 MG tablet Take 10 mg by mouth daily.        [name] zolpidem (AMBIEN) 5 MG tablet Take 5 mg by mouth at bedtime.             Review of Systems  Constitutional: Negative for fever, chills, weight loss, diaphoresis, activity change, appetite change, fatigue and unexpected weight change.  HENT: Positive for hearing loss, congestion, rhinorrhea, sneezing, voice change and postnasal drip. Negative for ear pain, nosebleeds, sore throat, facial swelling, mouth sores, trouble swallowing, neck pain, neck stiffness, dental problem, sinus pressure, tinnitus and ear discharge.        No dysphagia No sore throat Is hoarse  Eyes: Positive for itching. Negative for photophobia, discharge and visual disturbance.  Respiratory: Positive for cough and shortness of breath. Negative for apnea, hemoptysis, sputum production, choking, chest tightness, wheezing and stridor.   Cardiovascular: Negative for chest pain, palpitations,  orthopnea, leg swelling, syncope and PND.  Gastrointestinal: Positive for abdominal pain. Negative for nausea, vomiting, constipation, blood in stool and abdominal distention.  Genitourinary: Negative for dysuria, urgency, frequency, hematuria, flank pain, decreased urine volume and difficulty urinating.  Musculoskeletal: Positive for gait problem. Negative for myalgias, back pain, joint swelling and arthralgias.       Balance issue   Skin: Negative for color change, pallor and rash.  Neurological: Positive for weakness and numbness. Negative for dizziness, tremors, seizures, syncope, speech difficulty, light-headedness and headaches.  Hematological: Positive for environmental allergies. Negative for adenopathy. Does not bruise/bleed easily.  Psychiatric/Behavioral: Negative for confusion, sleep disturbance and agitation.  The patient is not nervous/anxious.        Objective:   Physical Exam  Filed Vitals:   03/25/11 1509  BP: 102/68  Pulse: 56  Temp: 97.5 F (36.4 C)  TempSrc: Oral  Height: 5\' 7"  (1.702 m)  Weight: 147 lb 9.6 oz (66.951 kg)  SpO2: 97%    Gen: Pleasant, well-nourished, in no distress,  normal affect  ENT: No lesions,  mouth clear,  oropharynx clear, no postnasal drip  Neck: No JVD, no TMG, no carotid bruits  Lungs: No use of accessory muscles, no dullness to percussion, improved airflow  Cardiovascular: RRR, heart sounds normal, no murmur or gallops, no peripheral edema  Abdomen: soft and NT, no HSM,  BS normal  Musculoskeletal: No deformities, no cyanosis or clubbing  Neuro: alert, non focal  Skin: Warm, no lesions or rashes   CXR 02/03/11 Large hiatal hernia, no active lung disease  Spirometry: FEV1 69% FEF 25- 75  51% on 03/04/2011     Assessment & Plan:   Extrinsic asthma, unspecified Improved peripheral airflow obstruction on exam and on spiro  Fef 25-75  51>>>60%  No change in FeV1 Improvement in cough noted Plan Cont pulmicort Prn  SABA Rov 4 months     Updated Medication List Outpatient Encounter Prescriptions as of 03/25/2011  Medication Sig Dispense Refill  . albuterol (PROVENTIL,VENTOLIN) 90 MCG/ACT inhaler Inhale 2 puffs into the lungs every 6 (six) hours as needed for wheezing.  17 g  12  . aluminum & magnesium hydroxide-simethicone (MYLANTA) 500-450-40 MG/5ML suspension Take by mouth every 6 (six) hours as needed. 200-200-20mg /79ml       . aspirin 81 MG tablet Take 81 mg by mouth daily.        . budesonide (PULMICORT FLEXHALER) 180 MCG/ACT inhaler Inhale 2 puffs into the lungs 2 (two) times daily.  1 each  5  . cholecalciferol (VITAMIN D) 1000 UNITS tablet Take 1,000 Units by mouth daily.        . Coenzyme Q10 (CO Q10) 200 MG CAPS Take 1 capsule by mouth daily.        . Cyanocobalamin (VITAMIN B-12 IJ) Inject as directed. monthly       . Docusate Calcium (STOOL SOFTENER PO) Take 1 capsule by mouth 3 (three) times daily.        . finasteride (PROSCAR) 5 MG tablet Take 1 tablet by mouth Daily.      [name] losartan (COZAAR) 25 MG tablet Take 25 mg by mouth daily.        . LUTEIN PO Take by mouth daily.        . metoprolol tartrate (LOPRESSOR) 25 MG tablet Take 1 tablet by mouth Twice daily.      . Multiple Vitamin (MULTIVITAMIN) tablet Take 1 tablet by mouth daily.        . Multiple Vitamins-Minerals (PRESERVISION AREDS PO) Take 1 capsule by mouth 2 (two) times daily.        [name] NITROSTAT 0.4 MG SL tablet as directed.      . RABEprazole (ACIPHEX) 20 MG tablet Take 20 mg by mouth daily.        . rosuvastatin (CRESTOR) 10 MG tablet Take 10 mg by mouth daily.        [name] zolpidem (AMBIEN) 5 MG tablet Take 5 mg by mouth at bedtime.

## 2011-03-25 NOTE — Assessment & Plan Note (Signed)
Improved peripheral airflow obstruction on exam and on spiro  Fef 25-75  51>>>60%  No change in FeV1 Improvement in cough noted Plan Cont pulmicort Prn SABA Rov 4 months

## 2011-03-25 NOTE — Patient Instructions (Signed)
Stay on Pulmicort two puff twice daily for now Use proventil /albuterol as needed Return 4 months or sooner if needed

## 2011-04-03 ENCOUNTER — Telehealth: Payer: Self-pay | Admitting: Critical Care Medicine

## 2011-04-03 MED ORDER — BUDESONIDE 180 MCG/ACT IN AEPB: 2.0000 | INHALATION_SPRAY | Freq: Two times a day (BID) | RESPIRATORY_TRACT | Status: DC

## 2011-04-03 NOTE — Telephone Encounter (Signed)
LMOM for pt that refill for Pulmicort was sent to [facility].

## 2011-05-30 DIAGNOSIS — L600 Ingrowing nail: Secondary | ICD-10-CM | POA: Diagnosis not present

## 2011-06-09 DIAGNOSIS — E782 Mixed hyperlipidemia: Secondary | ICD-10-CM | POA: Diagnosis not present

## 2011-06-09 DIAGNOSIS — I10 Essential (primary) hypertension: Secondary | ICD-10-CM | POA: Diagnosis not present

## 2011-06-09 DIAGNOSIS — I2510 Atherosclerotic heart disease of native coronary artery without angina pectoris: Secondary | ICD-10-CM | POA: Diagnosis not present

## 2011-06-10 ENCOUNTER — Other Ambulatory Visit: Payer: Medicare Other

## 2011-06-10 ENCOUNTER — Encounter: Payer: Self-pay | Admitting: Critical Care Medicine

## 2011-06-10 ENCOUNTER — Ambulatory Visit (INDEPENDENT_AMBULATORY_CARE_PROVIDER_SITE_OTHER): Payer: Medicare Other | Admitting: Critical Care Medicine

## 2011-06-10 VITALS — BP 130/68 | HR 62 | Temp 98.0°F | Ht 67.0 in | Wt 147.6 lb

## 2011-06-10 DIAGNOSIS — J45909 Unspecified asthma, uncomplicated: Secondary | ICD-10-CM

## 2011-06-10 NOTE — Progress Notes (Signed)
Subjective:    Patient ID: Alexander [name], male    DOB: [birth year], 78 y.o.   MRN: [id number]  Cough This is a chronic problem. The current episode started more than 1 year ago. The problem has been gradually improving. Episode frequency: cough only with laughing or intermittent. The cough is non-productive. Associated symptoms include nasal congestion and postnasal drip. Pertinent negatives include no chills, ear congestion, myalgias, sweats or weight loss. Exacerbated by: laughing ,  deep breath. He has tried nothing for the symptoms. His past medical history is significant for environmental allergies. There is no history of bronchiectasis, bronchitis or emphysema.   11/29 At last ov we rec: pulmicort two puff bid. Cough is better , is not as intense. Still present.  Deep breathing is improved.  Speech still impaired, still not the breath without stopping.  Notes some hoarseness.  06/11/2011 Easier to get air in and out, less cough and less dyspneic.    Past Medical History  Diagnosis Date  . Hypertension   . Hyperlipidemia   . GERD (gastroesophageal reflux disease)   . Spinal stenosis   . Migraines     OCULAR  . BPH (benign prostatic hyperplasia)   . CKD (chronic kidney disease), stage III   . Vitamin deficiency   . Diverticulitis   . Heart murmur   . Calcium oxalate renal stones   . Renal lesion   . Dry senile macular degeneration   . Allergic rhinitis   . Anemia   . Peripheral neuropathy      Family History  Problem Relation Age of Onset  . Allergies Daughter   . Asthma Grandchild   . Heart failure Mother   . Coronary artery disease Maternal Grandfather      History   Social History  . Marital Status: Widowed    Spouse Name: N/A    Number of Children: 3  . Years of Education: N/A   Occupational History  . Not on file.   Social History Main Topics  . Smoking status: Never Smoker   . Smokeless tobacco: Never Used  . Alcohol Use: No  . Drug Use: No  .  Sexually Active: Not on file   Other Topics Concern  . Not on file   Social History Narrative  . No narrative on file     No Known Allergies   Outpatient Prescriptions Prior to Visit  Medication Sig Dispense Refill  . albuterol (PROVENTIL,VENTOLIN) 90 MCG/ACT inhaler Inhale 2 puffs into the lungs every 6 (six) hours as needed for wheezing.  17 g  12  . aluminum & magnesium hydroxide-simethicone (MYLANTA) 500-450-40 MG/5ML suspension Take by mouth every 6 (six) hours as needed. 200-200-20mg /76ml       . aspirin 81 MG tablet Take 81 mg by mouth daily.        . budesonide (PULMICORT FLEXHALER) 180 MCG/ACT inhaler Inhale 2 puffs into the lungs 2 (two) times daily.  1 each  5  . cholecalciferol (VITAMIN D) 1000 UNITS tablet Take 1,000 Units by mouth daily.        . Coenzyme Q10 (CO Q10) 200 MG CAPS Take 1 capsule by mouth daily.        . Cyanocobalamin (VITAMIN B-12 IJ) Inject as directed. monthly       . Docusate Calcium (STOOL SOFTENER PO) Take 1 capsule by mouth 2 (two) times daily.       . finasteride (PROSCAR) 5 MG tablet Take 1 tablet by mouth Daily.      [name]  losartan (COZAAR) 25 MG tablet Take 25 mg by mouth daily.        . LUTEIN PO Take by mouth daily.        . metoprolol tartrate (LOPRESSOR) 25 MG tablet Take 1 tablet by mouth Twice daily.      . Multiple Vitamin (MULTIVITAMIN) tablet Take 1 tablet by mouth daily.        . Multiple Vitamins-Minerals (PRESERVISION AREDS PO) Take 1 capsule by mouth 2 (two) times daily.        [name] NITROSTAT 0.4 MG SL tablet as directed.      . RABEprazole (ACIPHEX) 20 MG tablet Take 20 mg by mouth daily.        . rosuvastatin (CRESTOR) 10 MG tablet Take 10 mg by mouth daily.        [name] zolpidem (AMBIEN) 5 MG tablet Take 5 mg by mouth at bedtime.             Review of Systems  Constitutional: Negative for chills, weight loss, diaphoresis, activity change, appetite change, fatigue and unexpected weight change.  HENT: Positive for hearing loss,  congestion, sneezing, voice change and postnasal drip. Negative for nosebleeds, facial swelling, mouth sores, trouble swallowing, neck stiffness, dental problem, sinus pressure, tinnitus and ear discharge.        No dysphagia No sore throat Is hoarse  Eyes: Positive for itching. Negative for photophobia, discharge and visual disturbance.  Respiratory: Positive for cough. Negative for apnea, choking, chest tightness and stridor.   Cardiovascular: Negative for palpitations.  Gastrointestinal: Negative for nausea, constipation, blood in stool and abdominal distention.  Genitourinary: Negative for dysuria, urgency, frequency, hematuria, flank pain, decreased urine volume and difficulty urinating.  Musculoskeletal: Positive for gait problem. Negative for myalgias, back pain, joint swelling and arthralgias.       Balance issue   Skin: Negative for color change and pallor.  Neurological: Positive for weakness and numbness. Negative for dizziness, tremors, seizures, syncope, speech difficulty and light-headedness.  Hematological: Positive for environmental allergies. Negative for adenopathy. Does not bruise/bleed easily.  Psychiatric/Behavioral: Negative for confusion, sleep disturbance and agitation. The patient is not nervous/anxious.        Objective:   Physical Exam  Filed Vitals:   06/10/11 1438  BP: 130/68  Pulse: 62  Temp: 98 F (36.7 C)  TempSrc: Oral  Height: 5\' 7"  (1.702 m)  Weight: 147 lb 9.6 oz (66.951 kg)  SpO2: 96%    Gen: Pleasant, well-nourished, in no distress,  normal affect  ENT: No lesions,  mouth clear,  oropharynx clear, no postnasal drip  Neck: No JVD, no TMG, no carotid bruits  Lungs: No use of accessory muscles, no dullness to percussion, improved airflow  Cardiovascular: RRR, heart sounds normal, no murmur or gallops, no peripheral edema  Abdomen: soft and NT, no HSM,  BS normal  Musculoskeletal: No deformities, no cyanosis or clubbing  Neuro: alert,  non focal  Skin: Warm, no lesions or rashes   CXR 02/03/11 Large hiatal hernia, no active lung disease  Spirometry: FEV1 69% FEF 25- 75  51% on 03/04/2011     Assessment & Plan:   Extrinsic asthma, unspecified Improved peripheral airflow obstruction on exam  Improvement in cough Check allergy profile Plan Cont pulmicort Prn SABA Rov 4 months       Updated Medication List Outpatient Encounter Prescriptions as of 06/10/2011  Medication Sig Dispense Refill  . albuterol (PROVENTIL,VENTOLIN) 90 MCG/ACT inhaler Inhale 2 puffs into the lungs every  6 (six) hours as needed for wheezing.  17 g  12  . aluminum & magnesium hydroxide-simethicone (MYLANTA) 500-450-40 MG/5ML suspension Take by mouth every 6 (six) hours as needed. 200-200-20mg /56ml       . aspirin 81 MG tablet Take 81 mg by mouth daily.        . budesonide (PULMICORT FLEXHALER) 180 MCG/ACT inhaler Inhale 2 puffs into the lungs 2 (two) times daily.  1 each  5  . cholecalciferol (VITAMIN D) 1000 UNITS tablet Take 1,000 Units by mouth daily.        . Coenzyme Q10 (CO Q10) 200 MG CAPS Take 1 capsule by mouth daily.        . Cyanocobalamin (VITAMIN B-12 IJ) Inject as directed. monthly       . Docusate Calcium (STOOL SOFTENER PO) Take 1 capsule by mouth 2 (two) times daily.       . finasteride (PROSCAR) 5 MG tablet Take 1 tablet by mouth Daily.      [name] losartan (COZAAR) 25 MG tablet Take 25 mg by mouth daily.        . LUTEIN PO Take by mouth daily.        . metoprolol tartrate (LOPRESSOR) 25 MG tablet Take 1 tablet by mouth Twice daily.      . Multiple Vitamin (MULTIVITAMIN) tablet Take 1 tablet by mouth daily.        . Multiple Vitamins-Minerals (PRESERVISION AREDS PO) Take 1 capsule by mouth 2 (two) times daily.        [name] NITROSTAT 0.4 MG SL tablet as directed.      . RABEprazole (ACIPHEX) 20 MG tablet Take 20 mg by mouth daily.        . rosuvastatin (CRESTOR) 10 MG tablet Take 10 mg by mouth daily.        [name] zolpidem (AMBIEN) 5  MG tablet Take 5 mg by mouth at bedtime.

## 2011-06-10 NOTE — Patient Instructions (Signed)
Stay on Pulmicort two puff twice daily Allergy profile today, I will call with results and direction on reduction of pulmicort dosing Return 4 months

## 2011-06-11 DIAGNOSIS — R269 Unspecified abnormalities of gait and mobility: Secondary | ICD-10-CM | POA: Diagnosis not present

## 2011-06-11 NOTE — Assessment & Plan Note (Addendum)
Improved peripheral airflow obstruction on exam  Improvement in cough Check allergy profile Plan Cont pulmicort Prn SABA Rov 4 months

## 2011-06-12 LAB — ALLERGY FULL PROFILE
Allergen, D pternoyssinus,d7: <0.1 kU/L (ref <0.35)
Alternaria Alternata: <0.1 kU/L (ref <0.35)
Aspergillus fumigatus, IgG: <0.1 kU/L (ref <0.35)
Bahia Grass: 0.14 kU/L (ref <0.35)
Bermuda Grass: <0.1 kU/L (ref <0.35)
Candida Albicans: <0.1 kU/L (ref <0.35)
Cat Dander: 0.15 kU/L (ref <0.35)
Common Ragweed: 1.02 kU/L — ABNORMAL HIGH (ref <0.35)
D. farinae: <0.1 kU/L (ref <0.35)
Elm IgE: <0.1 kU/L (ref <0.35)
G009 Red Top: 1.73 kU/L — ABNORMAL HIGH (ref <0.35)
House Dust Hollister: <0.1 kU/L (ref <0.35)
Lamb's Quarters: <0.1 kU/L (ref <0.35)
Oak: 0.27 kU/L (ref <0.35)
Plantain: <0.1 kU/L (ref <0.35)
Sycamore Tree: <0.1 kU/L (ref <0.35)

## 2011-06-13 DIAGNOSIS — E782 Mixed hyperlipidemia: Secondary | ICD-10-CM | POA: Diagnosis not present

## 2011-06-13 DIAGNOSIS — I10 Essential (primary) hypertension: Secondary | ICD-10-CM | POA: Diagnosis not present

## 2011-06-13 DIAGNOSIS — I2510 Atherosclerotic heart disease of native coronary artery without angina pectoris: Secondary | ICD-10-CM | POA: Diagnosis not present

## 2011-07-15 ENCOUNTER — Ambulatory Visit: Payer: Medicare Other | Admitting: Critical Care Medicine

## 2011-07-15 DIAGNOSIS — H3530 Unspecified macular degeneration: Secondary | ICD-10-CM | POA: Diagnosis not present

## 2011-07-15 DIAGNOSIS — H35369 Drusen (degenerative) of macula, unspecified eye: Secondary | ICD-10-CM | POA: Diagnosis not present

## 2011-07-23 DIAGNOSIS — J309 Allergic rhinitis, unspecified: Secondary | ICD-10-CM | POA: Diagnosis not present

## 2011-07-23 DIAGNOSIS — H903 Sensorineural hearing loss, bilateral: Secondary | ICD-10-CM | POA: Diagnosis not present

## 2011-07-23 DIAGNOSIS — H6120 Impacted cerumen, unspecified ear: Secondary | ICD-10-CM | POA: Diagnosis not present

## 2011-08-05 ENCOUNTER — Encounter: Payer: Self-pay | Admitting: Critical Care Medicine

## 2011-08-05 ENCOUNTER — Ambulatory Visit (INDEPENDENT_AMBULATORY_CARE_PROVIDER_SITE_OTHER): Payer: Medicare Other | Admitting: Critical Care Medicine

## 2011-08-05 VITALS — BP 106/60 | HR 66 | Temp 97.7°F | Ht 67.0 in | Wt 144.8 lb

## 2011-08-05 DIAGNOSIS — R0602 Shortness of breath: Secondary | ICD-10-CM

## 2011-08-05 DIAGNOSIS — J45909 Unspecified asthma, uncomplicated: Secondary | ICD-10-CM | POA: Diagnosis not present

## 2011-08-05 MED ORDER — BUDESONIDE 180 MCG/ACT IN AEPB: 2.0000 | INHALATION_SPRAY | Freq: Every day | RESPIRATORY_TRACT | Status: DC

## 2011-08-05 NOTE — Progress Notes (Signed)
Subjective:    Patient ID: Alexander [name], male    DOB: 05-29-1921, 78 y.o.   MRN: [id number]  HPI  08/05/2011 Since last two months cough gone. Main issue was inability to pull air in deeply.  Breathing is easier than a few months ago.  If will laugh, will gag at times.  Cannot sing without multiple breaths without a breath.  No wheeze.  No qhs dyspnea.   Not using SABA, remains on pulmicort bid Pt denies any significant sore throat, nasal congestion or excess secretions, fever, chills, sweats, unintended weight loss, pleurtic or exertional chest pain, orthopnea PND, or leg swelling Pt denies any increase in rescue therapy over baseline, denies waking up needing it or having any early am or nocturnal exacerbations of coughing/wheezing/or dyspnea. Pt also denies any obvious fluctuation in symptoms with  weather or environmental change or other alleviating or aggravating factors    Past Medical History  Diagnosis Date  . Hypertension   . Hyperlipidemia   . GERD (gastroesophageal reflux disease)   . Spinal stenosis   . Migraines     OCULAR  . BPH (benign prostatic hyperplasia)   . CKD (chronic kidney disease), stage III   . Vitamin deficiency   . Diverticulitis   . Heart murmur   . Calcium oxalate renal stones   . Renal lesion   . Dry senile macular degeneration   . Allergic rhinitis   . Anemia   . Peripheral neuropathy      Family History  Problem Relation Age of Onset  . Allergies Daughter   . Asthma Grandchild   . Heart failure Mother   . Coronary artery disease Maternal Grandfather      History   Social History  . Marital Status: Widowed    Spouse Name: N/A    Number of Children: 3  . Years of Education: N/A   Occupational History  . Not on file.   Social History Main Topics  . Smoking status: Never Smoker   . Smokeless tobacco: Never Used  . Alcohol Use: No  . Drug Use: No  . Sexually Active: Not on file   Other Topics Concern  . Not on file   Social  History Narrative  . No narrative on file     No Known Allergies   Outpatient Prescriptions Prior to Visit  Medication Sig Dispense Refill  . albuterol (PROVENTIL,VENTOLIN) 90 MCG/ACT inhaler Inhale 2 puffs into the lungs every 6 (six) hours as needed for wheezing.  17 g  12  . aluminum & magnesium hydroxide-simethicone (MYLANTA) 500-450-40 MG/5ML suspension Take by mouth every 6 (six) hours as needed. 200-200-20mg /9ml       . aspirin 81 MG tablet Take 81 mg by mouth daily.        . cholecalciferol (VITAMIN D) 1000 UNITS tablet Take 1,000 Units by mouth daily.        . Coenzyme Q10 (CO Q10) 200 MG CAPS Take 1 capsule by mouth daily.        . Cyanocobalamin (VITAMIN B-12 IJ) Inject as directed. monthly       . Docusate Calcium (STOOL SOFTENER PO) Take 1 capsule by mouth 2 (two) times daily.       . finasteride (PROSCAR) 5 MG tablet Take 1 tablet by mouth Daily.      [name] losartan (COZAAR) 25 MG tablet Take 25 mg by mouth daily.        . LUTEIN PO Take 20 mg by mouth  daily.       . metoprolol tartrate (LOPRESSOR) 25 MG tablet Take 1 tablet by mouth Twice daily.      . Multiple Vitamin (MULTIVITAMIN) tablet Take 1 tablet by mouth daily.        . Multiple Vitamins-Minerals (PRESERVISION AREDS PO) Take 1 capsule by mouth 2 (two) times daily.        [name] NITROSTAT 0.4 MG SL tablet as directed.      . RABEprazole (ACIPHEX) 20 MG tablet Take 20 mg by mouth daily.        . rosuvastatin (CRESTOR) 10 MG tablet Take 10 mg by mouth daily.        [name] zolpidem (AMBIEN) 5 MG tablet Take 5 mg by mouth at bedtime.        . budesonide (PULMICORT FLEXHALER) 180 MCG/ACT inhaler Inhale 2 puffs into the lungs 2 (two) times daily.  1 each  5       Review of Systems  Constitutional: Negative for diaphoresis, activity change, appetite change, fatigue and unexpected weight change.  HENT: Positive for hearing loss, congestion, sneezing and voice change. Negative for nosebleeds, facial swelling, mouth sores, trouble  swallowing, neck stiffness, dental problem, sinus pressure, tinnitus and ear discharge.        No dysphagia No sore throat Is hoarse  Eyes: Positive for itching. Negative for photophobia, discharge and visual disturbance.  Respiratory: Negative for apnea, choking, chest tightness and stridor.   Cardiovascular: Negative for palpitations.  Gastrointestinal: Negative for nausea, constipation, blood in stool and abdominal distention.  Genitourinary: Negative for dysuria, urgency, frequency, hematuria, flank pain, decreased urine volume and difficulty urinating.  Musculoskeletal: Positive for gait problem. Negative for back pain, joint swelling and arthralgias.       Balance issue   Skin: Negative for color change and pallor.  Neurological: Positive for weakness and numbness. Negative for dizziness, tremors, seizures, syncope, speech difficulty and light-headedness.  Hematological: Negative for adenopathy. Does not bruise/bleed easily.  Psychiatric/Behavioral: Negative for confusion, sleep disturbance and agitation. The patient is not nervous/anxious.        Objective:   Physical Exam  Filed Vitals:   08/05/11 1443  BP: 106/60  Pulse: 66  Temp: 97.7 F (36.5 C)  TempSrc: Oral  Height: 5\' 7"  (1.702 m)  Weight: 144 lb 12.8 oz (65.681 kg)  SpO2: 96%    Gen: Pleasant, well-nourished, in no distress,  normal affect  ENT: No lesions,  mouth clear,  oropharynx clear, no postnasal drip  Neck: No JVD, no TMG, no carotid bruits  Lungs: No use of accessory muscles, no dullness to percussion, improved airflow  Cardiovascular: RRR, heart sounds normal, no murmur or gallops, no peripheral edema  Abdomen: soft and NT, no HSM,  BS normal  Musculoskeletal: No deformities, no cyanosis or clubbing  Neuro: alert, non focal  Skin: Warm, no lesions or rashes   CXR 02/03/11 Large hiatal hernia, no active lung disease  Spirometry: FEV1 69% FEF 25- 75  51% on 03/04/2011     Assessment &  Plan:   Extrinsic asthma, unspecified Moderate persistent asthma with lower airway inflammation and very mild airflow obstruction essentially unchanged on spirometry. The patient however is clinically improved on inhaled steroid but is interested in achieving the lowest effective dose Plan Reduce Pulmicort to 2 inhalations once daily Return in 2 months to see if we can further reduce dosage of Pulmicort     Updated Medication List Outpatient Encounter Prescriptions as of 08/05/2011  Medication Sig Dispense Refill  . albuterol (PROVENTIL,VENTOLIN) 90 MCG/ACT inhaler Inhale 2 puffs into the lungs every 6 (six) hours as needed for wheezing.  17 g  12  . aluminum & magnesium hydroxide-simethicone (MYLANTA) 500-450-40 MG/5ML suspension Take by mouth every 6 (six) hours as needed. 200-200-20mg /70ml       . aspirin 81 MG tablet Take 81 mg by mouth daily.        . budesonide (PULMICORT FLEXHALER) 180 MCG/ACT inhaler Inhale 2 puffs into the lungs daily.  1 each  5  . cholecalciferol (VITAMIN D) 1000 UNITS tablet Take 1,000 Units by mouth daily.        . Coenzyme Q10 (CO Q10) 200 MG CAPS Take 1 capsule by mouth daily.        . Cyanocobalamin (VITAMIN B-12 IJ) Inject as directed. monthly       . Docusate Calcium (STOOL SOFTENER PO) Take 1 capsule by mouth 2 (two) times daily.       . finasteride (PROSCAR) 5 MG tablet Take 1 tablet by mouth Daily.      [name] losartan (COZAAR) 25 MG tablet Take 25 mg by mouth daily.        . LUTEIN PO Take 20 mg by mouth daily.       . metoprolol tartrate (LOPRESSOR) 25 MG tablet Take 1 tablet by mouth Twice daily.      . Multiple Vitamin (MULTIVITAMIN) tablet Take 1 tablet by mouth daily.        . Multiple Vitamins-Minerals (PRESERVISION AREDS PO) Take 1 capsule by mouth 2 (two) times daily.        [name] NITROSTAT 0.4 MG SL tablet as directed.      . RABEprazole (ACIPHEX) 20 MG tablet Take 20 mg by mouth daily.        . rosuvastatin (CRESTOR) 10 MG tablet Take 10 mg by mouth  daily.        [name] zolpidem (AMBIEN) 5 MG tablet Take 5 mg by mouth at bedtime.        [name] DISCONTD: budesonide (PULMICORT FLEXHALER) 180 MCG/ACT inhaler Inhale 2 puffs into the lungs 2 (two) times daily.  1 each  5

## 2011-08-05 NOTE — Assessment & Plan Note (Signed)
Moderate persistent asthma with lower airway inflammation and very mild airflow obstruction essentially unchanged on spirometry. The patient however is clinically improved on inhaled steroid but is interested in achieving the lowest effective dose Plan Reduce Pulmicort to 2 inhalations once daily Return in 2 months to see if we can further reduce dosage of Pulmicort

## 2011-08-05 NOTE — Patient Instructions (Signed)
Reduce pulmicort to two puff daily Return June 2013

## 2011-08-11 DIAGNOSIS — D239 Other benign neoplasm of skin, unspecified: Secondary | ICD-10-CM | POA: Diagnosis not present

## 2011-08-11 DIAGNOSIS — L821 Other seborrheic keratosis: Secondary | ICD-10-CM | POA: Diagnosis not present

## 2011-08-11 DIAGNOSIS — L570 Actinic keratosis: Secondary | ICD-10-CM | POA: Diagnosis not present

## 2011-08-15 DIAGNOSIS — R7301 Impaired fasting glucose: Secondary | ICD-10-CM | POA: Diagnosis not present

## 2011-08-15 DIAGNOSIS — E785 Hyperlipidemia, unspecified: Secondary | ICD-10-CM | POA: Diagnosis not present

## 2011-08-15 DIAGNOSIS — I2510 Atherosclerotic heart disease of native coronary artery without angina pectoris: Secondary | ICD-10-CM | POA: Diagnosis not present

## 2011-08-15 DIAGNOSIS — N183 Chronic kidney disease, stage 3 unspecified: Secondary | ICD-10-CM | POA: Diagnosis not present

## 2011-09-01 ENCOUNTER — Encounter: Payer: Self-pay | Admitting: Gastroenterology

## 2011-09-10 DIAGNOSIS — N183 Chronic kidney disease, stage 3 unspecified: Secondary | ICD-10-CM | POA: Diagnosis not present

## 2011-09-16 DIAGNOSIS — I2510 Atherosclerotic heart disease of native coronary artery without angina pectoris: Secondary | ICD-10-CM | POA: Diagnosis not present

## 2011-09-16 DIAGNOSIS — N183 Chronic kidney disease, stage 3 unspecified: Secondary | ICD-10-CM | POA: Diagnosis not present

## 2011-09-16 DIAGNOSIS — I129 Hypertensive chronic kidney disease with stage 1 through stage 4 chronic kidney disease, or unspecified chronic kidney disease: Secondary | ICD-10-CM | POA: Diagnosis not present

## 2011-10-14 ENCOUNTER — Other Ambulatory Visit: Payer: Self-pay | Admitting: Interventional Radiology

## 2011-10-14 DIAGNOSIS — N2889 Other specified disorders of kidney and ureter: Secondary | ICD-10-CM

## 2011-10-15 ENCOUNTER — Encounter: Payer: Self-pay | Admitting: Critical Care Medicine

## 2011-10-15 ENCOUNTER — Ambulatory Visit (INDEPENDENT_AMBULATORY_CARE_PROVIDER_SITE_OTHER): Payer: Medicare Other | Admitting: Critical Care Medicine

## 2011-10-15 VITALS — BP 106/54 | HR 57 | Temp 98.0°F | Ht 67.0 in | Wt 145.0 lb

## 2011-10-15 DIAGNOSIS — J45909 Unspecified asthma, uncomplicated: Secondary | ICD-10-CM

## 2011-10-15 NOTE — Patient Instructions (Addendum)
No change in medications. Return in         4 months 

## 2011-10-15 NOTE — Assessment & Plan Note (Signed)
Moderate persistent asthma with ongoing lower airway inflammation do to hypersensitivity from environmental allergens as well as microaspiration from high level reflux due to hiatal hernia Airway inflammation stable at this time although fixed airflow obstruction as documented on pulmonary function studies. Lengthy discussion regarding the pros and cons of combined inhaled steroid with long-acting bronchodilator therapy versus inhaled steroid was reviewed  Plan Continue Pulmicort 2 puffs daily Return 4 months

## 2011-10-15 NOTE — Progress Notes (Signed)
Subjective:    Patient ID: Alexander [name], male    DOB: 04-12-1922, 78 y.o.   MRN: [id number]  HPI  10/15/2011 At last ov we reduced pulmicort to two puff daily.  Pt notes a sl improvement.  Pt still not with a full breath.   Pt denies any significant sore throat, nasal congestion or excess secretions, fever, chills, sweats, unintended weight loss, pleurtic or exertional chest pain, orthopnea PND, or leg swelling Pt denies any increase in rescue therapy over baseline, denies waking up needing it or having any early am or nocturnal exacerbations of coughing/wheezing/or dyspnea. Pt also denies any obvious fluctuation in symptoms with  weather or environmental change or other alleviating or aggravating factors    Past Medical History  Diagnosis Date  . Hypertension   . Hyperlipidemia   . GERD (gastroesophageal reflux disease)   . Spinal stenosis   . Migraines     OCULAR  . BPH (benign prostatic hyperplasia)   . CKD (chronic kidney disease), stage III   . Vitamin deficiency   . Diverticulitis   . Heart murmur   . Calcium oxalate renal stones   . Renal lesion   . Dry senile macular degeneration   . Allergic rhinitis   . Anemia   . Peripheral neuropathy      Family History  Problem Relation Age of Onset  . Allergies Daughter   . Asthma Grandchild   . Heart failure Mother   . Coronary artery disease Maternal Grandfather      History   Social History  . Marital Status: Widowed    Spouse Name: N/A    Number of Children: 3  . Years of Education: N/A   Occupational History  . Not on file.   Social History Main Topics  . Smoking status: Never Smoker   . Smokeless tobacco: Never Used  . Alcohol Use: No  . Drug Use: No  . Sexually Active: Not on file   Other Topics Concern  . Not on file   Social History Narrative  . No narrative on file     No Known Allergies   Outpatient Prescriptions Prior to Visit  Medication Sig Dispense Refill  . albuterol  (PROVENTIL,VENTOLIN) 90 MCG/ACT inhaler Inhale 2 puffs into the lungs every 6 (six) hours as needed for wheezing.  17 g  12  . aluminum & magnesium hydroxide-simethicone (MYLANTA) 500-450-40 MG/5ML suspension Take by mouth every 6 (six) hours as needed. 200-200-20mg /74ml       . aspirin 81 MG tablet Take 81 mg by mouth daily.        . budesonide (PULMICORT FLEXHALER) 180 MCG/ACT inhaler Inhale 2 puffs into the lungs daily.  1 each  5  . cholecalciferol (VITAMIN D) 1000 UNITS tablet Take 1,000 Units by mouth daily.        . Coenzyme Q10 (CO Q10) 200 MG CAPS Take 1 capsule by mouth daily.        . Cyanocobalamin (VITAMIN B-12 IJ) Inject as directed. monthly       . Docusate Calcium (STOOL SOFTENER PO) Take 1 capsule by mouth 2 (two) times daily.       . finasteride (PROSCAR) 5 MG tablet Take 1 tablet by mouth Daily.      [name] losartan (COZAAR) 25 MG tablet Take 25 mg by mouth daily.        . LUTEIN PO Take 20 mg by mouth daily.       . metoprolol tartrate (LOPRESSOR)  25 MG tablet Take 1 tablet by mouth Twice daily.      . Multiple Vitamin (MULTIVITAMIN) tablet Take 1 tablet by mouth daily.        . Multiple Vitamins-Minerals (PRESERVISION AREDS PO) Take 1 capsule by mouth 2 (two) times daily.        [name] NITROSTAT 0.4 MG SL tablet as directed.      . rosuvastatin (CRESTOR) 10 MG tablet Take 10 mg by mouth daily.        [name] zolpidem (AMBIEN) 5 MG tablet Take 5 mg by mouth at bedtime.        . RABEprazole (ACIPHEX) 20 MG tablet Take 20 mg by mouth daily.             Review of Systems  Constitutional: Negative for diaphoresis, activity change, appetite change, fatigue and unexpected weight change.  HENT: Positive for hearing loss, congestion, sneezing and voice change. Negative for nosebleeds, facial swelling, mouth sores, trouble swallowing, neck stiffness, dental problem, sinus pressure, tinnitus and ear discharge.        No dysphagia No sore throat Is hoarse  Eyes: Positive for itching. Negative  for photophobia, discharge and visual disturbance.  Respiratory: Negative for apnea, choking, chest tightness and stridor.   Cardiovascular: Negative for palpitations.  Gastrointestinal: Negative for nausea, constipation, blood in stool and abdominal distention.  Genitourinary: Negative for dysuria, urgency, frequency, hematuria, flank pain, decreased urine volume and difficulty urinating.  Musculoskeletal: Positive for gait problem. Negative for back pain, joint swelling and arthralgias.       Balance issue   Skin: Negative for color change and pallor.  Neurological: Positive for weakness and numbness. Negative for dizziness, tremors, seizures, syncope, speech difficulty and light-headedness.  Hematological: Negative for adenopathy. Does not bruise/bleed easily.  Psychiatric/Behavioral: Negative for confusion, disturbed wake/sleep cycle and agitation. The patient is not nervous/anxious.        Objective:   Physical Exam  Filed Vitals:   10/15/11 1401  BP: 106/54  Pulse: 57  Temp: 98 F (36.7 C)  TempSrc: Oral  Height: 5\' 7"  (1.702 m)  Weight: 145 lb (65.772 kg)  SpO2: 94%    Gen: Pleasant, well-nourished, in no distress,  normal affect  ENT: No lesions,  mouth clear,  oropharynx clear, no postnasal drip  Neck: No JVD, no TMG, no carotid bruits  Lungs: No use of accessory muscles, no dullness to percussion, improved airflow, mild pseudo-wheeze  Cardiovascular: RRR, heart sounds normal, no murmur or gallops, no peripheral edema  Abdomen: soft and NT, no HSM,  BS normal  Musculoskeletal: No deformities, no cyanosis or clubbing  Neuro: alert, non focal  Skin: Warm, no lesions or rashes      Assessment & Plan:   Extrinsic asthma, unspecified Moderate persistent asthma with ongoing lower airway inflammation do to hypersensitivity from environmental allergens as well as microaspiration from high level reflux due to hiatal hernia Airway inflammation stable at this time  although fixed airflow obstruction as documented on pulmonary function studies. Lengthy discussion regarding the pros and cons of combined inhaled steroid with long-acting bronchodilator therapy versus inhaled steroid was reviewed  Plan Continue Pulmicort 2 puffs daily Return 4 months    Updated Medication List Outpatient Encounter Prescriptions as of 10/15/2011  Medication Sig Dispense Refill  . albuterol (PROVENTIL,VENTOLIN) 90 MCG/ACT inhaler Inhale 2 puffs into the lungs every 6 (six) hours as needed for wheezing.  17 g  12  . aluminum & magnesium hydroxide-simethicone (MYLANTA) 500-450-40 MG/5ML  suspension Take by mouth every 6 (six) hours as needed. 200-200-20mg /44ml       . aspirin 81 MG tablet Take 81 mg by mouth daily.        . budesonide (PULMICORT FLEXHALER) 180 MCG/ACT inhaler Inhale 2 puffs into the lungs daily.  1 each  5  . cholecalciferol (VITAMIN D) 1000 UNITS tablet Take 1,000 Units by mouth daily.        . Coenzyme Q10 (CO Q10) 200 MG CAPS Take 1 capsule by mouth daily.        . Cyanocobalamin (VITAMIN B-12 IJ) Inject as directed. monthly       . Docusate Calcium (STOOL SOFTENER PO) Take 1 capsule by mouth 2 (two) times daily.       [name] esomeprazole (NEXIUM) 20 MG capsule Take 20 mg by mouth daily.      . finasteride (PROSCAR) 5 MG tablet Take 1 tablet by mouth Daily.      . fluticasone (FLONASE) 50 MCG/ACT nasal spray Place 2 sprays into the nose as needed.      [name] losartan (COZAAR) 25 MG tablet Take 25 mg by mouth daily.        . LUTEIN PO Take 20 mg by mouth daily.       . metoprolol tartrate (LOPRESSOR) 25 MG tablet Take 1 tablet by mouth Twice daily.      . Multiple Vitamin (MULTIVITAMIN) tablet Take 1 tablet by mouth daily.        . Multiple Vitamins-Minerals (PRESERVISION AREDS PO) Take 1 capsule by mouth 2 (two) times daily.        [name] NITROSTAT 0.4 MG SL tablet as directed.      . rosuvastatin (CRESTOR) 10 MG tablet Take 10 mg by mouth daily.        [name] zolpidem  (AMBIEN) 5 MG tablet Take 5 mg by mouth at bedtime.        . RABEprazole (ACIPHEX) 20 MG tablet Take 20 mg by mouth daily.

## 2011-10-24 DIAGNOSIS — D518 Other vitamin B12 deficiency anemias: Secondary | ICD-10-CM | POA: Diagnosis not present

## 2011-10-24 DIAGNOSIS — E785 Hyperlipidemia, unspecified: Secondary | ICD-10-CM | POA: Diagnosis not present

## 2011-10-30 DIAGNOSIS — Z23 Encounter for immunization: Secondary | ICD-10-CM | POA: Diagnosis not present

## 2011-12-01 ENCOUNTER — Telehealth: Payer: Self-pay | Admitting: Critical Care Medicine

## 2011-12-01 MED ORDER — BUDESONIDE 180 MCG/ACT IN AEPB: 2.0000 | INHALATION_SPRAY | Freq: Every day | RESPIRATORY_TRACT | Status: DC

## 2011-12-01 NOTE — Telephone Encounter (Signed)
[id number] x1 

## 2011-12-01 NOTE — Telephone Encounter (Signed)
Pt returned our call.  Call him back @ [place]

## 2011-12-01 NOTE — Telephone Encounter (Signed)
I spoke with pt and is aware rx has been sent to [facility]. Nothing further was needed

## 2011-12-10 DIAGNOSIS — I359 Nonrheumatic aortic valve disorder, unspecified: Secondary | ICD-10-CM | POA: Diagnosis not present

## 2011-12-10 HISTORY — PX: TRANSTHORACIC ECHOCARDIOGRAM: SHX275

## 2011-12-17 ENCOUNTER — Ambulatory Visit
Admission: RE | Admit: 2011-12-17 | Discharge: 2011-12-17 | Disposition: A | Discharge status: Home / Self Care | Payer: Medicare Other | Source: Ambulatory Visit | Attending: Interventional Radiology | Admitting: Interventional Radiology

## 2011-12-17 ENCOUNTER — Ambulatory Visit (HOSPITAL_COMMUNITY)
Admission: RE | Admit: 2011-12-17 | Discharge: 2011-12-17 | Disposition: A | Discharge status: Home / Self Care | Payer: Medicare Other | Source: Ambulatory Visit | Attending: Interventional Radiology | Admitting: Interventional Radiology

## 2011-12-17 DIAGNOSIS — N281 Cyst of kidney, acquired: Secondary | ICD-10-CM | POA: Diagnosis not present

## 2011-12-17 DIAGNOSIS — N289 Disorder of kidney and ureter, unspecified: Secondary | ICD-10-CM | POA: Diagnosis not present

## 2011-12-17 DIAGNOSIS — K449 Diaphragmatic hernia without obstruction or gangrene: Secondary | ICD-10-CM | POA: Insufficient documentation

## 2011-12-17 DIAGNOSIS — Z09 Encounter for follow-up examination after completed treatment for conditions other than malignant neoplasm: Secondary | ICD-10-CM | POA: Insufficient documentation

## 2011-12-17 DIAGNOSIS — N2889 Other specified disorders of kidney and ureter: Secondary | ICD-10-CM

## 2011-12-17 DIAGNOSIS — Z5189 Encounter for other specified aftercare: Secondary | ICD-10-CM | POA: Diagnosis not present

## 2011-12-17 NOTE — Progress Notes (Signed)
Patient denies hematuria or any problems w/ urination.  Denies pain associated w/ Cryoablation of Left Renal mass (10-17-2009).  Overall doing well.  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

## 2011-12-17 NOTE — Progress Notes (Signed)
Patient ID: Alexander [name], male   DOB: 01-Dec-1921, 78 y.o.   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 He has been doing well with no complaints except some occasional transient abdominal discomfort. He does have a known large hiatal hernia. He recently traveled to [place].  Review of Systems: No hematuria, dysuria or left flank pain. No fever.  Exam: Vital signs: Blood pressure 117/57, pulse 57, respirations 16, temperature 97.7, oxygen saturation 94% on room air. General: No distress. Abdomen: Soft and nontender. No left flank tenderness.  Labs on 10/24/2011 demonstrated BUN 27, creatinine 1.6 and estimated GFR 41 ml/minute. This is stable when compared to prior labs.  Imaging: Follow-up MRI of the abdomen was performed earlier today. This demonstrates stable ablation defect along the anterolateral aspect of the mid left kidney with no evidence of underlying lesion. Other simple cysts of the kidneys bilaterally are stable in appearance.  Assessment and Plan: No evidence of left renal tumor recurrence after cryoablation. 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 He will be turning 78 years old later this year and I have not recommended any further routine follow-up of the ablation site unless he has any concerning new symptoms. He is agreeable to this plan.

## 2011-12-18 DIAGNOSIS — I10 Essential (primary) hypertension: Secondary | ICD-10-CM | POA: Diagnosis not present

## 2011-12-18 DIAGNOSIS — I2510 Atherosclerotic heart disease of native coronary artery without angina pectoris: Secondary | ICD-10-CM | POA: Diagnosis not present

## 2011-12-18 DIAGNOSIS — I359 Nonrheumatic aortic valve disorder, unspecified: Secondary | ICD-10-CM | POA: Diagnosis not present

## 2011-12-29 DIAGNOSIS — L821 Other seborrheic keratosis: Secondary | ICD-10-CM | POA: Diagnosis not present

## 2011-12-29 DIAGNOSIS — L570 Actinic keratosis: Secondary | ICD-10-CM | POA: Diagnosis not present

## 2012-01-01 DIAGNOSIS — R0789 Other chest pain: Secondary | ICD-10-CM | POA: Diagnosis not present

## 2012-01-01 DIAGNOSIS — D473 Essential (hemorrhagic) thrombocythemia: Secondary | ICD-10-CM | POA: Diagnosis not present

## 2012-01-13 DIAGNOSIS — Z23 Encounter for immunization: Secondary | ICD-10-CM | POA: Diagnosis not present

## 2012-01-21 ENCOUNTER — Encounter: Payer: Self-pay | Admitting: Critical Care Medicine

## 2012-01-21 ENCOUNTER — Ambulatory Visit (INDEPENDENT_AMBULATORY_CARE_PROVIDER_SITE_OTHER): Payer: Medicare Other | Admitting: Critical Care Medicine

## 2012-01-21 VITALS — BP 114/56 | HR 51 | Temp 97.9°F | Ht 67.0 in | Wt 142.0 lb

## 2012-01-21 DIAGNOSIS — J45909 Unspecified asthma, uncomplicated: Secondary | ICD-10-CM | POA: Diagnosis not present

## 2012-01-21 NOTE — Patient Instructions (Addendum)
No change in medications. Return in         4 months 

## 2012-01-21 NOTE — Progress Notes (Signed)
Subjective:    Patient ID: Alexander [name], male    DOB: [birth year], 78 y.o.   MRN: [id number]  HPI  10/15/2011 At last ov we reduced pulmicort to two puff daily.  Pt notes a sl improvement.  Pt still not with a full breath.   Pt denies any significant sore throat, nasal congestion or excess secretions, fever, chills, sweats, unintended weight loss, pleurtic or exertional chest pain, orthopnea PND, or leg swelling Pt denies any increase in rescue therapy over baseline, denies waking up needing it or having any early am or nocturnal exacerbations of coughing/wheezing/or dyspnea. Pt also denies any obvious fluctuation in symptoms with  weather or environmental change or other alleviating or aggravating factors  01/21/2012 No real change since last OV.  Pt will get dyspneic up two flights of stairs.   No real cough . CXR 8/13: NAD. HH.   WBC 8100  Plt Ct 464K  Past Medical History  Diagnosis Date  . Hypertension   . Hyperlipidemia   . GERD (gastroesophageal reflux disease)   . Spinal stenosis   . Migraines     OCULAR  . BPH (benign prostatic hyperplasia)   . CKD (chronic kidney disease), stage III   . Vitamin deficiency   . Diverticulitis   . Heart murmur   . Calcium oxalate renal stones   . Renal lesion   . Dry senile macular degeneration   . Allergic rhinitis   . Anemia   . Peripheral neuropathy      Family History  Problem Relation Age of Onset  . Allergies Daughter   . Asthma Grandchild   . Heart failure Mother   . Coronary artery disease Maternal Grandfather      History   Social History  . Marital Status: Widowed    Spouse Name: N/A    Number of Children: 3  . Years of Education: N/A   Occupational History  . Not on file.   Social History Main Topics  . Smoking status: Never Smoker   . Smokeless tobacco: Never Used  . Alcohol Use: No  . Drug Use: No  . Sexually Active: Not on file   Other Topics Concern  . Not on file   Social History Narrative  .  No narrative on file     No Known Allergies   Outpatient Prescriptions Prior to Visit  Medication Sig Dispense Refill  . albuterol (PROVENTIL,VENTOLIN) 90 MCG/ACT inhaler Inhale 2 puffs into the lungs every 6 (six) hours as needed for wheezing.  17 g  12  . aluminum & magnesium hydroxide-simethicone (MYLANTA) 500-450-40 MG/5ML suspension Take by mouth every 6 (six) hours as needed. 200-200-20mg /51ml       . aspirin 81 MG tablet Take 81 mg by mouth daily.        . budesonide (PULMICORT FLEXHALER) 180 MCG/ACT inhaler Inhale 2 puffs into the lungs daily.  3 Inhaler  3  . cholecalciferol (VITAMIN D) 1000 UNITS tablet Take 1,000 Units by mouth daily.        . Cyanocobalamin (VITAMIN B-12 IJ) Inject as directed. monthly       . Docusate Calcium (STOOL SOFTENER PO) Take 1 capsule by mouth 2 (two) times daily.       . finasteride (PROSCAR) 5 MG tablet Take 1 tablet by mouth Daily.      . fluticasone (FLONASE) 50 MCG/ACT nasal spray Place 2 sprays into the nose as needed.      [name] losartan (COZAAR) 25 MG  tablet Take 25 mg by mouth daily.        . LUTEIN PO Take 20 mg by mouth daily.       . metoprolol tartrate (LOPRESSOR) 25 MG tablet Take 1 tablet by mouth Twice daily.      . Multiple Vitamin (MULTIVITAMIN) tablet Take 1 tablet by mouth daily.        . Multiple Vitamins-Minerals (PRESERVISION AREDS PO) Take 1 capsule by mouth 2 (two) times daily.        [name] NITROSTAT 0.4 MG SL tablet as directed.      . rosuvastatin (CRESTOR) 10 MG tablet Take 10 mg by mouth daily.        [name] zolpidem (AMBIEN) 5 MG tablet Take 5 mg by mouth at bedtime.        . Coenzyme Q10 (CO Q10) 200 MG CAPS Take 1 capsule by mouth daily.        [name] esomeprazole (NEXIUM) 20 MG capsule Take 20 mg by mouth daily.      . RABEprazole (ACIPHEX) 20 MG tablet Take 20 mg by mouth daily.             Review of Systems  Constitutional: Negative for diaphoresis, activity change, appetite change, fatigue and unexpected weight change.  HENT:  Positive for hearing loss, congestion, sneezing and voice change. Negative for nosebleeds, facial swelling, mouth sores, trouble swallowing, neck stiffness, dental problem, sinus pressure, tinnitus and ear discharge.        No dysphagia No sore throat Is hoarse  Eyes: Positive for itching. Negative for photophobia, discharge and visual disturbance.  Respiratory: Negative for apnea, choking, chest tightness and stridor.   Cardiovascular: Negative for palpitations.  Gastrointestinal: Negative for nausea, constipation, blood in stool and abdominal distention.  Genitourinary: Negative for dysuria, urgency, frequency, hematuria, flank pain, decreased urine volume and difficulty urinating.  Musculoskeletal: Positive for gait problem. Negative for back pain, joint swelling and arthralgias.       Balance issue   Skin: Negative for color change and pallor.  Neurological: Positive for weakness and numbness. Negative for dizziness, tremors, seizures, syncope, speech difficulty and light-headedness.  Hematological: Negative for adenopathy. Does not bruise/bleed easily.  Psychiatric/Behavioral: Negative for confusion, disturbed wake/sleep cycle and agitation. The patient is not nervous/anxious.        Objective:   Physical Exam  Filed Vitals:   01/21/12 0943  BP: 114/56  Pulse: 51  Temp: 97.9 F (36.6 C)  TempSrc: Oral  Height: 5\' 7"  (1.702 m)  Weight: 142 lb (64.411 kg)  SpO2: 96%    Gen: Pleasant, well-nourished, in no distress,  normal affect  ENT: No lesions,  mouth clear,  oropharynx clear, no postnasal drip  Neck: No JVD, no TMG, no carotid bruits  Lungs: No use of accessory muscles, no dullness to percussion, improved airflow  Cardiovascular: RRR, heart sounds normal, no murmur or gallops, no peripheral edema  Abdomen: soft and NT, no HSM,  BS normal  Musculoskeletal: No deformities, no cyanosis or clubbing  Neuro: alert, non focal  Skin: Warm, no lesions or rashes        Assessment & Plan:   Extrinsic asthma, unspecified Moderate persistent asthma with lower airway inflammation and airway obstruction on spirometry Likely etiologies include hypersensitivity from environmental allergens as well as microaspiration from high level reflux to severe gastric hiatal hernia FEV1 69% FEF 25- 75  51% on 03/04/2011 FeV1 68%  Fef 25 75 44%  On 08/05/2011 Stable at present.  Improved airflow on ICS Plan Continue once daily pulmicort No other medication changes Rov 4 months    Updated Medication List Outpatient Encounter Prescriptions as of 01/21/2012  Medication Sig Dispense Refill  . albuterol (PROVENTIL,VENTOLIN) 90 MCG/ACT inhaler Inhale 2 puffs into the lungs every 6 (six) hours as needed for wheezing.  17 g  12  . aluminum & magnesium hydroxide-simethicone (MYLANTA) 500-450-40 MG/5ML suspension Take by mouth every 6 (six) hours as needed. 200-200-20mg /17ml       . aspirin 81 MG tablet Take 81 mg by mouth daily.        . budesonide (PULMICORT FLEXHALER) 180 MCG/ACT inhaler Inhale 2 puffs into the lungs daily.  3 Inhaler  3  . cholecalciferol (VITAMIN D) 1000 UNITS tablet Take 1,000 Units by mouth daily.        . Coenzyme Q10 (CO Q-10) 300 MG CAPS Take 1 capsule by mouth daily.      . Cyanocobalamin (VITAMIN B-12 IJ) Inject as directed. monthly       . Docusate Calcium (STOOL SOFTENER PO) Take 1 capsule by mouth 2 (two) times daily.       . finasteride (PROSCAR) 5 MG tablet Take 1 tablet by mouth Daily.      . fluticasone (FLONASE) 50 MCG/ACT nasal spray Place 2 sprays into the nose as needed.      [name] losartan (COZAAR) 25 MG tablet Take 25 mg by mouth daily.        . LUTEIN PO Take 20 mg by mouth daily.       . metoprolol tartrate (LOPRESSOR) 25 MG tablet Take 1 tablet by mouth Twice daily.      . Multiple Vitamin (MULTIVITAMIN) tablet Take 1 tablet by mouth daily.        . Multiple Vitamins-Minerals (PRESERVISION AREDS PO) Take 1 capsule by mouth 2 (two) times  daily.        [name] NITROSTAT 0.4 MG SL tablet as directed.      [name] omeprazole (PRILOSEC) 20 MG capsule Take 20 mg by mouth daily.      . rosuvastatin (CRESTOR) 10 MG tablet Take 10 mg by mouth daily.        [name] zolpidem (AMBIEN) 5 MG tablet Take 5 mg by mouth at bedtime.        [name] DISCONTD: Coenzyme Q10 (CO Q10) 200 MG CAPS Take 1 capsule by mouth daily.        [name] DISCONTD: esomeprazole (NEXIUM) 20 MG capsule Take 20 mg by mouth daily.      [name] DISCONTD: RABEprazole (ACIPHEX) 20 MG tablet Take 20 mg by mouth daily.

## 2012-01-22 NOTE — Assessment & Plan Note (Addendum)
Moderate persistent asthma with lower airway inflammation and airway obstruction on spirometry Likely etiologies include hypersensitivity from environmental allergens as well as microaspiration from high level reflux to severe gastric hiatal hernia FEV1 69% FEF 25- 75  51% on 03/04/2011 FeV1 68%  Fef 25 75 44%  On 08/05/2011 Stable at present.  Improved airflow on ICS Plan Continue once daily pulmicort No other medication changes Rov 4 months

## 2012-01-23 DIAGNOSIS — H3530 Unspecified macular degeneration: Secondary | ICD-10-CM | POA: Diagnosis not present

## 2012-01-23 DIAGNOSIS — H353190 Nonexudative age-related macular degeneration, unspecified eye, stage unspecified: Secondary | ICD-10-CM | POA: Diagnosis not present

## 2012-01-23 DIAGNOSIS — H35369 Drusen (degenerative) of macula, unspecified eye: Secondary | ICD-10-CM | POA: Diagnosis not present

## 2012-02-20 DIAGNOSIS — N289 Disorder of kidney and ureter, unspecified: Secondary | ICD-10-CM | POA: Diagnosis not present

## 2012-02-20 DIAGNOSIS — N138 Other obstructive and reflux uropathy: Secondary | ICD-10-CM | POA: Diagnosis not present

## 2012-02-20 DIAGNOSIS — N281 Cyst of kidney, acquired: Secondary | ICD-10-CM | POA: Diagnosis not present

## 2012-02-20 DIAGNOSIS — N401 Enlarged prostate with lower urinary tract symptoms: Secondary | ICD-10-CM | POA: Diagnosis not present

## 2012-02-24 DIAGNOSIS — H353190 Nonexudative age-related macular degeneration, unspecified eye, stage unspecified: Secondary | ICD-10-CM | POA: Diagnosis not present

## 2012-02-24 DIAGNOSIS — H353290 Exudative age-related macular degeneration, unspecified eye, stage unspecified: Secondary | ICD-10-CM | POA: Diagnosis not present

## 2012-02-24 DIAGNOSIS — H35369 Drusen (degenerative) of macula, unspecified eye: Secondary | ICD-10-CM | POA: Diagnosis not present

## 2012-02-26 DIAGNOSIS — R269 Unspecified abnormalities of gait and mobility: Secondary | ICD-10-CM | POA: Diagnosis not present

## 2012-02-26 DIAGNOSIS — M48061 Spinal stenosis, lumbar region without neurogenic claudication: Secondary | ICD-10-CM | POA: Diagnosis not present

## 2012-02-26 DIAGNOSIS — G609 Hereditary and idiopathic neuropathy, unspecified: Secondary | ICD-10-CM | POA: Diagnosis not present

## 2012-03-12 DIAGNOSIS — L608 Other nail disorders: Secondary | ICD-10-CM | POA: Diagnosis not present

## 2012-03-30 DIAGNOSIS — H35369 Drusen (degenerative) of macula, unspecified eye: Secondary | ICD-10-CM | POA: Diagnosis not present

## 2012-03-30 DIAGNOSIS — H353190 Nonexudative age-related macular degeneration, unspecified eye, stage unspecified: Secondary | ICD-10-CM | POA: Diagnosis not present

## 2012-03-30 DIAGNOSIS — H353290 Exudative age-related macular degeneration, unspecified eye, stage unspecified: Secondary | ICD-10-CM | POA: Diagnosis not present

## 2012-04-09 DIAGNOSIS — H3530 Unspecified macular degeneration: Secondary | ICD-10-CM | POA: Diagnosis not present

## 2012-04-09 DIAGNOSIS — H35729 Serous detachment of retinal pigment epithelium, unspecified eye: Secondary | ICD-10-CM | POA: Diagnosis not present

## 2012-04-12 DIAGNOSIS — L821 Other seborrheic keratosis: Secondary | ICD-10-CM | POA: Diagnosis not present

## 2012-04-12 DIAGNOSIS — H1830 Unspecified corneal membrane change: Secondary | ICD-10-CM | POA: Diagnosis not present

## 2012-04-12 DIAGNOSIS — L299 Pruritus, unspecified: Secondary | ICD-10-CM | POA: Diagnosis not present

## 2012-04-14 DIAGNOSIS — H2510 Age-related nuclear cataract, unspecified eye: Secondary | ICD-10-CM | POA: Diagnosis not present

## 2012-04-14 DIAGNOSIS — Z125 Encounter for screening for malignant neoplasm of prostate: Secondary | ICD-10-CM | POA: Diagnosis not present

## 2012-04-14 DIAGNOSIS — H353190 Nonexudative age-related macular degeneration, unspecified eye, stage unspecified: Secondary | ICD-10-CM | POA: Diagnosis not present

## 2012-04-14 DIAGNOSIS — E785 Hyperlipidemia, unspecified: Secondary | ICD-10-CM | POA: Diagnosis not present

## 2012-04-14 DIAGNOSIS — M810 Age-related osteoporosis without current pathological fracture: Secondary | ICD-10-CM | POA: Diagnosis not present

## 2012-04-14 DIAGNOSIS — I2510 Atherosclerotic heart disease of native coronary artery without angina pectoris: Secondary | ICD-10-CM | POA: Diagnosis not present

## 2012-04-14 DIAGNOSIS — D539 Nutritional anemia, unspecified: Secondary | ICD-10-CM | POA: Diagnosis not present

## 2012-04-14 DIAGNOSIS — N183 Chronic kidney disease, stage 3 unspecified: Secondary | ICD-10-CM | POA: Diagnosis not present

## 2012-04-14 DIAGNOSIS — D518 Other vitamin B12 deficiency anemias: Secondary | ICD-10-CM | POA: Diagnosis not present

## 2012-04-14 DIAGNOSIS — H1830 Unspecified corneal membrane change: Secondary | ICD-10-CM | POA: Diagnosis not present

## 2012-04-14 DIAGNOSIS — H3530 Unspecified macular degeneration: Secondary | ICD-10-CM | POA: Diagnosis not present

## 2012-04-21 DIAGNOSIS — N183 Chronic kidney disease, stage 3 unspecified: Secondary | ICD-10-CM | POA: Diagnosis not present

## 2012-04-21 DIAGNOSIS — I2510 Atherosclerotic heart disease of native coronary artery without angina pectoris: Secondary | ICD-10-CM | POA: Diagnosis not present

## 2012-04-21 DIAGNOSIS — Z0000 Encounter for general adult medical examination without abnormal findings: Secondary | ICD-10-CM | POA: Diagnosis not present

## 2012-04-21 DIAGNOSIS — H353190 Nonexudative age-related macular degeneration, unspecified eye, stage unspecified: Secondary | ICD-10-CM | POA: Diagnosis not present

## 2012-04-21 DIAGNOSIS — H2510 Age-related nuclear cataract, unspecified eye: Secondary | ICD-10-CM | POA: Diagnosis not present

## 2012-04-21 DIAGNOSIS — H35369 Drusen (degenerative) of macula, unspecified eye: Secondary | ICD-10-CM | POA: Diagnosis not present

## 2012-04-21 DIAGNOSIS — D518 Other vitamin B12 deficiency anemias: Secondary | ICD-10-CM | POA: Diagnosis not present

## 2012-04-21 DIAGNOSIS — R7301 Impaired fasting glucose: Secondary | ICD-10-CM | POA: Diagnosis not present

## 2012-05-07 DIAGNOSIS — N183 Chronic kidney disease, stage 3 unspecified: Secondary | ICD-10-CM | POA: Diagnosis not present

## 2012-05-07 DIAGNOSIS — N2581 Secondary hyperparathyroidism of renal origin: Secondary | ICD-10-CM | POA: Diagnosis not present

## 2012-05-12 DIAGNOSIS — E782 Mixed hyperlipidemia: Secondary | ICD-10-CM | POA: Diagnosis not present

## 2012-05-12 DIAGNOSIS — I2510 Atherosclerotic heart disease of native coronary artery without angina pectoris: Secondary | ICD-10-CM | POA: Diagnosis not present

## 2012-05-12 DIAGNOSIS — N289 Disorder of kidney and ureter, unspecified: Secondary | ICD-10-CM | POA: Diagnosis not present

## 2012-05-18 DIAGNOSIS — N183 Chronic kidney disease, stage 3 unspecified: Secondary | ICD-10-CM | POA: Diagnosis not present

## 2012-05-18 DIAGNOSIS — I2510 Atherosclerotic heart disease of native coronary artery without angina pectoris: Secondary | ICD-10-CM | POA: Diagnosis not present

## 2012-05-18 DIAGNOSIS — N289 Disorder of kidney and ureter, unspecified: Secondary | ICD-10-CM | POA: Diagnosis not present

## 2012-05-21 DIAGNOSIS — H2510 Age-related nuclear cataract, unspecified eye: Secondary | ICD-10-CM | POA: Diagnosis not present

## 2012-05-21 DIAGNOSIS — H35729 Serous detachment of retinal pigment epithelium, unspecified eye: Secondary | ICD-10-CM | POA: Diagnosis not present

## 2012-05-21 DIAGNOSIS — H35369 Drusen (degenerative) of macula, unspecified eye: Secondary | ICD-10-CM | POA: Diagnosis not present

## 2012-05-21 DIAGNOSIS — H353190 Nonexudative age-related macular degeneration, unspecified eye, stage unspecified: Secondary | ICD-10-CM | POA: Diagnosis not present

## 2012-05-21 DIAGNOSIS — H1830 Unspecified corneal membrane change: Secondary | ICD-10-CM | POA: Diagnosis not present

## 2012-06-02 ENCOUNTER — Ambulatory Visit: Payer: Medicare Other | Admitting: Critical Care Medicine

## 2012-06-16 ENCOUNTER — Ambulatory Visit (INDEPENDENT_AMBULATORY_CARE_PROVIDER_SITE_OTHER): Payer: Medicare Other | Admitting: Critical Care Medicine

## 2012-06-16 ENCOUNTER — Encounter: Payer: Self-pay | Admitting: Critical Care Medicine

## 2012-06-16 VITALS — BP 108/62 | HR 63 | Temp 97.7°F | Ht 66.5 in | Wt 139.0 lb

## 2012-06-16 DIAGNOSIS — J45909 Unspecified asthma, uncomplicated: Secondary | ICD-10-CM

## 2012-06-16 MED ORDER — BUDESONIDE 180 MCG/ACT IN AEPB: 1.0000 | INHALATION_SPRAY | Freq: Every day | RESPIRATORY_TRACT | Status: DC

## 2012-06-16 NOTE — Assessment & Plan Note (Signed)
Moderate persistent asthma with lower airway inflammation and improvement Plan Maintain Pulmicort at one puff daily Return 6 months

## 2012-06-16 NOTE — Progress Notes (Signed)
Subjective:    Patient ID: Alexander [name], male    DOB: [birth year], 79 y.o.   MRN: [id number]  Asthma Associated symptoms include sneezing. Pertinent negatives include no appetite change or trouble swallowing. His past medical history is significant for asthma.   06/16/2012 Since the last visit the patient is unwell. There is no sense of shortness of breath. He is able to get a full breath without difficulty. There is no cough wheezing or discomfort. He maintains Pulmicort 2 puffs daily. Pt denies any significant sore throat, nasal congestion or excess secretions, fever, chills, sweats, unintended weight loss, pleurtic or exertional chest pain, orthopnea PND, or leg swelling Pt denies any increase in rescue therapy over baseline, denies waking up needing it or having any early am or nocturnal exacerbations of coughing/wheezing/or dyspnea. Pt also denies any obvious fluctuation in symptoms with  weather or environmental change or other alleviating or aggravating factors     Past Medical History  Diagnosis Date  . Hypertension   . Hyperlipidemia   . GERD (gastroesophageal reflux disease)   . Spinal stenosis   . Migraines     OCULAR  . BPH (benign prostatic hyperplasia)   . CKD (chronic kidney disease), stage III   . Vitamin deficiency   . Diverticulitis   . Heart murmur   . Calcium oxalate renal stones   . Renal lesion   . Dry senile macular degeneration   . Allergic rhinitis   . Anemia   . Peripheral neuropathy      Family History  Problem Relation Age of Onset  . Allergies Daughter   . Asthma Grandchild   . Heart failure Mother   . Coronary artery disease Maternal Grandfather      History   Social History  . Marital Status: Widowed    Spouse Name: N/A    Number of Children: 3  . Years of Education: N/A   Occupational History  . Not on file.   Social History Main Topics  . Smoking status: Never Smoker   . Smokeless tobacco: Never Used  . Alcohol Use: No  .  Drug Use: No  . Sexually Active: Not on file   Other Topics Concern  . Not on file   Social History Narrative  . No narrative on file     No Known Allergies   Outpatient Prescriptions Prior to Visit  Medication Sig Dispense Refill  . albuterol (PROVENTIL,VENTOLIN) 90 MCG/ACT inhaler Inhale 2 puffs into the lungs every 6 (six) hours as needed for wheezing.  17 g  12  . aluminum & magnesium hydroxide-simethicone (MYLANTA) 500-450-40 MG/5ML suspension Take by mouth every 6 (six) hours as needed. 200-200-20mg /77ml       . aspirin 81 MG tablet Take 81 mg by mouth daily.        . budesonide (PULMICORT FLEXHALER) 180 MCG/ACT inhaler Inhale 2 puffs into the lungs daily.  3 Inhaler  3  . cholecalciferol (VITAMIN D) 1000 UNITS tablet Take 1,000 Units by mouth daily.        . Coenzyme Q10 (CO Q-10) 300 MG CAPS Take 1 capsule by mouth daily.      . Cyanocobalamin (VITAMIN B-12 IJ) Inject as directed. monthly       . Docusate Calcium (STOOL SOFTENER PO) Take 1 capsule by mouth 2 (two) times daily.       . finasteride (PROSCAR) 5 MG tablet Take 1 tablet by mouth Daily.      . fluticasone (FLONASE) 50  MCG/ACT nasal spray Place 2 sprays into the nose as needed.      [name] losartan (COZAAR) 25 MG tablet Take 25 mg by mouth daily.        . LUTEIN PO Take 20 mg by mouth daily.       . metoprolol tartrate (LOPRESSOR) 25 MG tablet Take 1 tablet by mouth Twice daily.      . Multiple Vitamin (MULTIVITAMIN) tablet Take 1 tablet by mouth daily.        . Multiple Vitamins-Minerals (PRESERVISION AREDS PO) Take 1 capsule by mouth 2 (two) times daily.        [name] NITROSTAT 0.4 MG SL tablet as directed.      [name] omeprazole (PRILOSEC) 20 MG capsule Take 20 mg by mouth daily.      [name] zolpidem (AMBIEN) 5 MG tablet Take 5 mg by mouth at bedtime.        . rosuvastatin (CRESTOR) 10 MG tablet Take 10 mg by mouth daily.         No facility-administered medications prior to visit.       Review of Systems  Constitutional:  Negative for diaphoresis, activity change, appetite change, fatigue and unexpected weight change.  HENT: Positive for hearing loss, congestion, sneezing and voice change. Negative for nosebleeds, facial swelling, mouth sores, trouble swallowing, neck stiffness, dental problem, sinus pressure, tinnitus and ear discharge.        No dysphagia No sore throat Is hoarse  Eyes: Positive for itching. Negative for photophobia, discharge and visual disturbance.  Respiratory: Negative for apnea, choking, chest tightness and stridor.   Cardiovascular: Negative for palpitations.  Gastrointestinal: Negative for nausea, constipation, blood in stool and abdominal distention.  Genitourinary: Negative for dysuria, urgency, frequency, hematuria, flank pain, decreased urine volume and difficulty urinating.  Musculoskeletal: Positive for gait problem. Negative for back pain, joint swelling and arthralgias.       Balance issue   Skin: Negative for color change and pallor.  Neurological: Positive for weakness and numbness. Negative for dizziness, tremors, seizures, syncope, speech difficulty and light-headedness.  Hematological: Negative for adenopathy. Does not bruise/bleed easily.  Psychiatric/Behavioral: Negative for confusion, sleep disturbance and agitation. The patient is not nervous/anxious.        Objective:   Physical Exam  Filed Vitals:   06/16/12 1005  BP: 108/62  Pulse: 63  Temp: 97.7 F (36.5 C)  TempSrc: Oral  Height: 5' 6.5" (1.689 m)  Weight: 139 lb (63.05 kg)  SpO2: 98%    Gen: Pleasant, well-nourished, in no distress,  normal affect  ENT: No lesions,  mouth clear,  oropharynx clear, no postnasal drip  Neck: No JVD, no TMG, no carotid bruits  Lungs: No use of accessory muscles, no dullness to percussion, improved airflow  Cardiovascular: RRR, heart sounds normal, no murmur or gallops, no peripheral edema  Abdomen: soft and NT, no HSM,  BS normal  Musculoskeletal: No  deformities, no cyanosis or clubbing  Neuro: alert, non focal  Skin: Warm, no lesions or rashes      Assessment & Plan:   No problem-specific assessment & plan notes found for this encounter.   Updated Medication List Outpatient Encounter Prescriptions as of 06/16/2012  Medication Sig Dispense Refill  . albuterol (PROVENTIL,VENTOLIN) 90 MCG/ACT inhaler Inhale 2 puffs into the lungs every 6 (six) hours as needed for wheezing.  17 g  12  . aluminum & magnesium hydroxide-simethicone (MYLANTA) 500-450-40 MG/5ML suspension Take by mouth every 6 (six) hours as  needed. 200-200-20mg /45ml       . aspirin 81 MG tablet Take 81 mg by mouth daily.        . budesonide (PULMICORT FLEXHALER) 180 MCG/ACT inhaler Inhale 2 puffs into the lungs daily.  3 Inhaler  3  . cholecalciferol (VITAMIN D) 1000 UNITS tablet Take 1,000 Units by mouth daily.        . Coenzyme Q10 (CO Q-10) 300 MG CAPS Take 1 capsule by mouth daily.      . Cyanocobalamin (VITAMIN B-12 IJ) Inject as directed. monthly       . Docusate Calcium (STOOL SOFTENER PO) Take 1 capsule by mouth 2 (two) times daily.       . finasteride (PROSCAR) 5 MG tablet Take 1 tablet by mouth Daily.      . fluticasone (FLONASE) 50 MCG/ACT nasal spray Place 2 sprays into the nose as needed.      [name] losartan (COZAAR) 25 MG tablet Take 25 mg by mouth daily.        . LUTEIN PO Take 20 mg by mouth daily.       . metoprolol tartrate (LOPRESSOR) 25 MG tablet Take 1 tablet by mouth Twice daily.      . Multiple Vitamin (MULTIVITAMIN) tablet Take 1 tablet by mouth daily.        . Multiple Vitamins-Minerals (PRESERVISION AREDS PO) Take 1 capsule by mouth 2 (two) times daily.        [name] NITROSTAT 0.4 MG SL tablet as directed.      [name] omeprazole (PRILOSEC) 20 MG capsule Take 20 mg by mouth daily.      [name] zolpidem (AMBIEN) 5 MG tablet Take 5 mg by mouth at bedtime.        . [DISCONTINUED] rosuvastatin (CRESTOR) 10 MG tablet Take 10 mg by mouth daily.         No  facility-administered encounter medications on file as of 06/16/2012.

## 2012-06-16 NOTE — Patient Instructions (Addendum)
REduce to one puff daily on pulmicort  Message me in [place] on your response to reduced dose of pulmicort Return 6 months

## 2012-07-01 DIAGNOSIS — E782 Mixed hyperlipidemia: Secondary | ICD-10-CM | POA: Diagnosis not present

## 2012-07-01 DIAGNOSIS — Z79899 Other long term (current) drug therapy: Secondary | ICD-10-CM | POA: Diagnosis not present

## 2012-07-01 DIAGNOSIS — R5381 Other malaise: Secondary | ICD-10-CM | POA: Diagnosis not present

## 2012-07-12 DIAGNOSIS — L259 Unspecified contact dermatitis, unspecified cause: Secondary | ICD-10-CM | POA: Diagnosis not present

## 2012-07-12 DIAGNOSIS — L821 Other seborrheic keratosis: Secondary | ICD-10-CM | POA: Diagnosis not present

## 2012-07-12 DIAGNOSIS — L819 Disorder of pigmentation, unspecified: Secondary | ICD-10-CM | POA: Diagnosis not present

## 2012-07-12 DIAGNOSIS — L723 Sebaceous cyst: Secondary | ICD-10-CM | POA: Diagnosis not present

## 2012-07-12 DIAGNOSIS — L820 Inflamed seborrheic keratosis: Secondary | ICD-10-CM | POA: Diagnosis not present

## 2012-08-05 DIAGNOSIS — R7989 Other specified abnormal findings of blood chemistry: Secondary | ICD-10-CM | POA: Diagnosis not present

## 2012-08-09 ENCOUNTER — Other Ambulatory Visit: Payer: Self-pay | Admitting: Oncology

## 2012-08-13 ENCOUNTER — Telehealth: Payer: Self-pay | Admitting: Oncology

## 2012-08-13 NOTE — Telephone Encounter (Signed)
C/D 08/13/12 for appt. 09/15/12

## 2012-08-13 NOTE — Telephone Encounter (Deleted)
C/D 01/13/13 for appt. 09/15/12

## 2012-08-17 DIAGNOSIS — H353190 Nonexudative age-related macular degeneration, unspecified eye, stage unspecified: Secondary | ICD-10-CM | POA: Diagnosis not present

## 2012-08-17 DIAGNOSIS — H353290 Exudative age-related macular degeneration, unspecified eye, stage unspecified: Secondary | ICD-10-CM | POA: Diagnosis not present

## 2012-08-17 DIAGNOSIS — H35729 Serous detachment of retinal pigment epithelium, unspecified eye: Secondary | ICD-10-CM | POA: Diagnosis not present

## 2012-08-17 DIAGNOSIS — H1830 Unspecified corneal membrane change: Secondary | ICD-10-CM | POA: Diagnosis not present

## 2012-08-17 DIAGNOSIS — H2510 Age-related nuclear cataract, unspecified eye: Secondary | ICD-10-CM | POA: Diagnosis not present

## 2012-08-17 DIAGNOSIS — H35369 Drusen (degenerative) of macula, unspecified eye: Secondary | ICD-10-CM | POA: Diagnosis not present

## 2012-08-18 ENCOUNTER — Encounter: Payer: Self-pay | Admitting: Oncology

## 2012-08-18 ENCOUNTER — Other Ambulatory Visit: Payer: Self-pay | Admitting: Oncology

## 2012-08-18 DIAGNOSIS — D473 Essential (hemorrhagic) thrombocythemia: Secondary | ICD-10-CM

## 2012-08-18 DIAGNOSIS — D75839 Thrombocytosis, unspecified: Secondary | ICD-10-CM

## 2012-08-18 HISTORY — DX: Thrombocytosis, unspecified: D75.839

## 2012-08-19 ENCOUNTER — Telehealth: Payer: Self-pay | Admitting: Oncology

## 2012-08-19 NOTE — Telephone Encounter (Signed)
, °

## 2012-08-26 ENCOUNTER — Telehealth: Payer: Self-pay | Admitting: Oncology

## 2012-08-26 NOTE — Telephone Encounter (Signed)
LVOM FOR TO LAB APPT PRIOR TO VISIT ON 04/29 @ 10:45. INSTRUCTED PT TO RETURN CALL IN CONFIRM MESSAGE WAS RECEIVED

## 2012-08-31 ENCOUNTER — Other Ambulatory Visit: Payer: Medicare Other | Admitting: Lab

## 2012-09-01 ENCOUNTER — Other Ambulatory Visit (HOSPITAL_BASED_OUTPATIENT_CLINIC_OR_DEPARTMENT_OTHER): Payer: Medicare Other | Admitting: Lab

## 2012-09-01 DIAGNOSIS — D473 Essential (hemorrhagic) thrombocythemia: Secondary | ICD-10-CM | POA: Diagnosis not present

## 2012-09-01 DIAGNOSIS — D75839 Thrombocytosis, unspecified: Secondary | ICD-10-CM

## 2012-09-01 LAB — LACTATE DEHYDROGENASE (CC13): LDH: 163 U/L (ref 125–245)

## 2012-09-01 LAB — CBC & DIFF AND RETIC
EOS%: 4 % (ref 0.0–7.0)
MCH: 30.9 pg (ref 27.2–33.4)
MCHC: 32.4 g/dL (ref 32.0–36.0)
MCV: 95.3 fL (ref 79.3–98.0)
MONO%: 6.3 % (ref 0.0–14.0)
RBC: 4.3 10*6/uL (ref 4.20–5.82)
RDW: 14.6 % (ref 11.0–14.6)
Retic %: 1.51 % (ref 0.80–1.80)
nRBC: 0 % (ref 0–0)

## 2012-09-01 LAB — SEDIMENTATION RATE: Sed Rate: 12 mm/hr (ref 0–16)

## 2012-09-01 LAB — IRON AND TIBC
TIBC: 279 ug/dL (ref 215–435)
UIBC: 199 ug/dL (ref 125–400)

## 2012-09-01 LAB — COMPREHENSIVE METABOLIC PANEL (CC13)
Albumin: 3.5 g/dL (ref 3.5–5.0)
Alkaline Phosphatase: 82 U/L (ref 40–150)
BUN: 27.9 mg/dL — ABNORMAL HIGH (ref 7.0–26.0)
CO2: 24 mEq/L (ref 22–29)
Glucose: 116 mg/dl — ABNORMAL HIGH (ref 70–99)
Potassium: 4.1 mEq/L (ref 3.5–5.1)

## 2012-09-01 LAB — CHCC SMEAR

## 2012-09-01 LAB — URIC ACID (CC13): Uric Acid, Serum: 6.1 mg/dl (ref 2.6–7.4)

## 2012-09-01 LAB — MORPHOLOGY

## 2012-09-15 ENCOUNTER — Other Ambulatory Visit: Payer: Medicare Other | Admitting: Lab

## 2012-09-15 ENCOUNTER — Ambulatory Visit (HOSPITAL_BASED_OUTPATIENT_CLINIC_OR_DEPARTMENT_OTHER): Payer: Medicare Other | Admitting: Oncology

## 2012-09-15 ENCOUNTER — Encounter: Payer: Self-pay | Admitting: Oncology

## 2012-09-15 ENCOUNTER — Telehealth: Payer: Self-pay | Admitting: Oncology

## 2012-09-15 ENCOUNTER — Ambulatory Visit: Payer: Medicare Other

## 2012-09-15 VITALS — BP 121/69 | HR 58 | Temp 97.2°F | Resp 17 | Ht 66.0 in | Wt 136.0 lb

## 2012-09-15 DIAGNOSIS — D75839 Thrombocytosis, unspecified: Secondary | ICD-10-CM

## 2012-09-15 DIAGNOSIS — D473 Essential (hemorrhagic) thrombocythemia: Secondary | ICD-10-CM

## 2012-09-15 DIAGNOSIS — Z7982 Long term (current) use of aspirin: Secondary | ICD-10-CM | POA: Diagnosis not present

## 2012-09-15 NOTE — Progress Notes (Signed)
Checked in new patient. No financial issues. °

## 2012-09-15 NOTE — Progress Notes (Signed)
New Patient Hematology-Oncology Evaluation   Alexander [name] [id number] 11-Oct-1921 79 y.o. 09/15/2012  CC: Dr. Hardie Shackleton; Dr. 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 He did a lot of his training in hematology at the [facility] many years ago. He has had a long and illustrious career. 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 He has, chronic, renal insufficiency commensurate with his age with a creatinine of 1.5. At the time of a visit with his nephrologist on 05/18/2012 a progress notes states that a platelet count was 593,000 on 04/14/2012. A followup CBC done 08/12/2012 showed a platelet count of 711,000 with hemoglobin 14.1, hematocrit 44.7, MCV 100, white count 8500,  no differential. Lab done in my office in anticipation of today's visit on April 30 shows hemoglobin 13.3, hematocrit 41%, MCV 95, white count 7400, 62% neutrophils, 24 lymphocytes, 6 monocytes, platelet count 703,000. Iron level 80, ferritin 201. He has no history of any chronic inflammatory disorder such as rheumatoid arthritis. He has no constitutional symptoms. He has never had a thrombotic event including DVT, PE, or stroke. He takes 81 mg of aspirin daily. He has no history of exposure to high-dose radiation or organic solvents. There is no family history of any blood disorder.    PMH: Past Medical History  Diagnosis Date  . Hypertension   . Hyperlipidemia   . GERD (gastroesophageal reflux disease)   . Spinal stenosis   . Migraines     OCULAR  . BPH (benign prostatic hyperplasia)   . CKD (chronic kidney disease), stage III   . Vitamin deficiency   . Diverticulitis   . Heart murmur   . Calcium oxalate renal stones   . Renal lesion   . Dry senile macular degeneration   . Allergic rhinitis    . Anemia   . Peripheral neuropathy   . Thrombocytosis 08/18/2012    Platelets 711,000  Hb 14, WBC 8,500 08/05/12  No history of MI, ulcers, emphysema, diabetes, hepatitis, yellow jaundice, malaria, mononucleosis, thyroid trouble, seizure, stroke, blood clots.  Past Surgical History  Procedure Laterality Date  . Tonsillectomy  1937  . Appendectomy  1942  . Inguinal hernia repair      x2  . Coronary artery bypass graft  1996  . Nasal concha bullosa resection    . Laparoscopic cholecystectomy  1992  . Left renal mass ablated  12/11    Allergies: No Known Allergies  Medications: Aspirin 81 mg daily, Proventil inhaler when necessary, Pulmicort inhaler one puff daily, B12 supplements, Proscar 5 mg daily, Flonase when necessary, losartan 25 mg daily, Lopressor 25 mg twice a day, ZT 10 mg daily, Ambien 5 mg at bedtime when necessary, Prilosec 20 mg daily, Nitrostat 0.4 mg sublingual when necessary chest pain, lutein 20 mg daily, multivitamins, coenzyme Q, and vitamin D, daily.   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 No alcohol. He has 3 children. 2 sons are physicians. One daughter is in the health management and public health field No toxic exposures. Family History: Family History  Problem Relation Age of Onset  . Allergies Daughter   . Asthma Grandchild   . Heart failure Mother   . Coronary artery disease Maternal Grandfather   He has one sister who is alive and well at age 45.  Review of Systems: Constitutional  symptoms: No constitutional symptoms HEENT: No sore throat Respiratory: Occasional rhinitis Cardiovascular:  No chest pain now 16 years post bypass surgery Gastrointestinal ROS: Occasional reflux symptoms Genito-Urinary ROS: No urinary tract symptoms on Proscar Hematological and Lymphatic: Musculoskeletal: No muscle bone or joint pain Neurologic: No headache or change in vision Dermatologic: No rash. He was using some topical vitamin E preparation which  she stopped since he read it might cause platelets to go up. Remaining ROS negative.  Physical Exam: Blood pressure 121/69, pulse 58, temperature 97.2 F (36.2 C), temperature source Oral, resp. rate 17, height 5\' 6"  (1.676 m), weight 136 lb (61.689 kg). Wt Readings from Last 3 Encounters:  09/15/12 136 lb (61.689 kg)  06/16/12 139 lb (63.05 kg)  01/21/12 142 lb (64.411 kg)    General appearance: Thin Caucasian man HENNT: Pharynx no erythema or exudate Lymph nodes: No lymphadenopathy Breasts: Lungs: Clear to auscultation resonant to percussion Heart: Regular rhythm, I was unable to appreciate a murmur Vascular: No carotid bruits, no cyanosis Abdominal: Soft, nontender, no mass, no organomegaly GU: Extremities: No edema, no calf tenderness Neurologic: Mentally very sharp, PERRLA, optic disc sharp and vessels normal, no hemorrhage exudate, motor strength 5 over 5, reflexes 1+ symmetric, sensation intact to vibration over the fingertips Skin: No rash or ecchymosis    Lab Results: Lab Results  Component Value Date   WBC 7.4 09/01/2012   HGB 13.3 09/01/2012   HCT 41.0 09/01/2012   MCV 95.3 09/01/2012   PLT 703* 09/01/2012     Chemistry      Component Value Date/Time   NA 142 09/01/2012 0950   NA 139 10/18/2009 0330   K 4.1 09/01/2012 0950   K 4.5 10/18/2009 0330   CL 108* 09/01/2012 0950   CL 108 10/18/2009 0330   CO2 24 09/01/2012 0950   CO2 23 10/18/2009 0330   BUN 27.9* 09/01/2012 0950   BUN 21 10/18/2009 0330   CREATININE 1.6* 09/01/2012 0950   CREATININE 1.49 10/18/2009 0330      Component Value Date/Time   CALCIUM 9.3 09/01/2012 0950   CALCIUM 9.0 10/18/2009 0330   ALKPHOS 82 09/01/2012 0950   AST 22 09/01/2012 0950   ALT 16 09/01/2012 0950   BILITOT 0.40 09/01/2012 0950    Janus-2 kinase gene mutation was detected   Review of peripheral blood film: Normochromic, normocytic, red cells. No immature myeloid cells. Occasional benign reactive lymphocyte. Increased platelet  number with normal morphology.  .    Impression and Plan: Essential thrombocythemia  JAK-2 gene mutation is seen about 50% of people with essential thrombocythemia but when it is positive it is diagnostic. We discussed the myeloproliferative disorders in general and essential thrombocythemia specifically. Main risk is thrombosis, either arterial or venous, secondary to both the increased number of platelets and to an abnormality in platelet function. Experts differ in their recommendations with respect to use of platelet lowering drugs. One expert in the field, Dr. Wille Celeste, at the [facility], feels that all patients over the age of 28 should be on platelet lowering drugs. This opinion is not shared by other experts such as Dr.Spivak at [facility]. I share Dr. Raylene Everts opinion. Since it is the platelet dysfunction and not the absolute number that is the primary determinant of thrombotic risk and that,  by and large, since people with platelet counts less than 1 million get adequate thrombo-prophylaxis with aspirin, I do not recommend platelet lowering drugs unless there is a history  of a prior thrombotic event or if the platelet count rises significantly above 1 million. Even  the 1 million ceiling is arbitrary.  Recommendation: Continue aspirin as thrombo-prophylaxis. I do not feel he needs to be on platelet lowering drugs at this time. If he does, we discussed optimal treatment at present would be use of hydroxyurea in low dose. An alternative would be anagrelide but this drug may have cardiac side effects so would not be a good option for this gentleman.  I will see him back again in 6 months.     Annia Belt, MD 09/15/2012, 5:00 PM

## 2012-10-01 DIAGNOSIS — H35369 Drusen (degenerative) of macula, unspecified eye: Secondary | ICD-10-CM | POA: Diagnosis not present

## 2012-10-01 DIAGNOSIS — H35729 Serous detachment of retinal pigment epithelium, unspecified eye: Secondary | ICD-10-CM | POA: Diagnosis not present

## 2012-10-01 DIAGNOSIS — H1830 Unspecified corneal membrane change: Secondary | ICD-10-CM | POA: Diagnosis not present

## 2012-10-01 DIAGNOSIS — H353190 Nonexudative age-related macular degeneration, unspecified eye, stage unspecified: Secondary | ICD-10-CM | POA: Diagnosis not present

## 2012-10-01 DIAGNOSIS — H2510 Age-related nuclear cataract, unspecified eye: Secondary | ICD-10-CM | POA: Diagnosis not present

## 2012-10-11 ENCOUNTER — Telehealth (INDEPENDENT_AMBULATORY_CARE_PROVIDER_SITE_OTHER): Payer: Self-pay

## 2012-10-11 NOTE — Telephone Encounter (Signed)
V/M  Patient appointment has been moved to 10/21/12 @ 2:45 with Dr. Lucia Gaskins

## 2012-10-12 DIAGNOSIS — H353290 Exudative age-related macular degeneration, unspecified eye, stage unspecified: Secondary | ICD-10-CM | POA: Diagnosis not present

## 2012-10-18 DIAGNOSIS — Z23 Encounter for immunization: Secondary | ICD-10-CM | POA: Diagnosis not present

## 2012-10-18 DIAGNOSIS — R7301 Impaired fasting glucose: Secondary | ICD-10-CM | POA: Diagnosis not present

## 2012-10-18 DIAGNOSIS — Z1331 Encounter for screening for depression: Secondary | ICD-10-CM | POA: Diagnosis not present

## 2012-10-18 DIAGNOSIS — J45909 Unspecified asthma, uncomplicated: Secondary | ICD-10-CM | POA: Diagnosis not present

## 2012-10-18 DIAGNOSIS — K4090 Unilateral inguinal hernia, without obstruction or gangrene, not specified as recurrent: Secondary | ICD-10-CM | POA: Diagnosis not present

## 2012-10-18 DIAGNOSIS — D759 Disease of blood and blood-forming organs, unspecified: Secondary | ICD-10-CM | POA: Diagnosis not present

## 2012-10-18 DIAGNOSIS — N183 Chronic kidney disease, stage 3 unspecified: Secondary | ICD-10-CM | POA: Diagnosis not present

## 2012-10-18 DIAGNOSIS — I2510 Atherosclerotic heart disease of native coronary artery without angina pectoris: Secondary | ICD-10-CM | POA: Diagnosis not present

## 2012-10-21 ENCOUNTER — Ambulatory Visit (INDEPENDENT_AMBULATORY_CARE_PROVIDER_SITE_OTHER): Payer: Medicare Other | Admitting: Surgery

## 2012-10-21 ENCOUNTER — Encounter (INDEPENDENT_AMBULATORY_CARE_PROVIDER_SITE_OTHER): Payer: Self-pay | Admitting: Surgery

## 2012-10-21 VITALS — BP 112/56 | HR 60 | Resp 14 | Ht 67.0 in | Wt 137.6 lb

## 2012-10-21 DIAGNOSIS — K4090 Unilateral inguinal hernia, without obstruction or gangrene, not specified as recurrent: Secondary | ICD-10-CM | POA: Diagnosis not present

## 2012-10-21 NOTE — Progress Notes (Signed)
Re:   Alexander [name] DOB:   08/17/1921 MRN:   [id number]  ASSESSMENT AND PLAN: 1.  Left inguinal hernia  I discussed the indications and complications of hernia surgery with the patient.  I discussed both the laparoscopic and open approach to hernia repair..  The potential risks of hernia surgery include, but are not limited to, bleeding, infection, open surgery, nerve injury, and recurrence of the hernia.  I provided the patient literature about hernia surgery.  We talked about the Shouldice repair that was done in [place].  We talked about mesh repairs, that have beenroutine since about 1990.  At this time he just wants to watch it, which is appropriate for his age and size of hernia.  The hernia is small, though he says that at times it is much bigger.  2.  Moderate thrombocytosis  Saw [name] - 09/15/2012 3.  Hypertension 4.  GERD  Large Hiatal Hernia by CXR and CT scan. 5.  Spinal Stenosis 6.  BPH 7.  CKD, stage III  Creatinine - 1.6 - 09/02/2102 8.  He was using Retin - [name] on his hands.  This apparently this can cause thrombocytosis.  He has stopped this. 9.  Aortic insufficiency 10.  Status post percutaneous cryoablation of left renal tumor on 10/17/2009. 11.  Moderate asthma  Has seen Dr. Tia Masker.  On pulmicort.  Chief Complaint  Patient presents with  . new problem    eval [facility]   REFERRING PHYSICIAN: Jerlyn Ly, MD  HISTORY OF PRESENT ILLNESS: Alexander [name] is [name] 79 y.o. (DOB: [birth year])  white  male whose primary care physician is Alexander A, MD and comes to me today for [name] left inguinal hernia.  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  He has had [name] prior left inguinal hernia repair about 40 years ago.  He is not sure if he ever had [name] hernia repair on the right. Over at least several weeks, if not longer, he has noticed [name] bulge in his left groin.  The bulge is usually larger than what he can show me  today. He's had [name] prior appendectomy around 1942.  He has had [name] colonoscopy around 2012 by his son.  He has some mild GERD for which he takes Prilosec.  He has no significant GI disease.   Past Medical History  Diagnosis Date  . Hypertension   . Hyperlipidemia   . GERD (gastroesophageal reflux disease)   . Spinal stenosis   . Migraines     OCULAR  . BPH (benign prostatic hyperplasia)   . CKD (chronic kidney disease), stage III   . Vitamin deficiency   . Diverticulitis   . Heart murmur   . Calcium oxalate renal stones   . Renal lesion   . Dry senile macular degeneration   . Allergic rhinitis   . Anemia   . Peripheral neuropathy   . Thrombocytosis 08/18/2012    Platelets 711,000  Hb 14, WBC 8,500 08/05/12      Past Surgical History  Procedure Laterality Date  . Tonsillectomy  1937  . Appendectomy  1942  . Inguinal hernia repair      x2  . Coronary artery bypass graft  1996  . Nasal concha bullosa resection    . Laparoscopic cholecystectomy  1992  . Left renal mass ablated  12/11      Current Outpatient Prescriptions  Medication Sig Dispense Refill  .  aluminum & magnesium hydroxide-simethicone (MYLANTA) 500-450-40 MG/5ML suspension Take by mouth every 6 (six) hours as needed. 200-200-20mg /57ml       . aspirin 81 MG tablet Take 81 mg by mouth daily.        . budesonide (PULMICORT FLEXHALER) 180 MCG/ACT inhaler Inhale 1 puff into the lungs daily.  3 Inhaler  3  . cholecalciferol (VITAMIN D) 1000 UNITS tablet Take 2,000 Units by mouth daily.       . Coenzyme Q10 (CO Q-10) 300 MG CAPS Take 1 capsule by mouth daily.      . Cyanocobalamin (VITAMIN B-12 IJ) Inject as directed. monthly       . Docusate Calcium (STOOL SOFTENER PO) Take 1 capsule by mouth 2 (two) times daily as needed.       . finasteride (PROSCAR) 5 MG tablet Take 1 tablet by mouth 3 (three) times [name] week. TIW      . fluticasone (FLONASE) 50 MCG/ACT nasal spray Place 1 spray into the nose as needed.       [name] losartan  (COZAAR) 25 MG tablet Take 25 mg by mouth daily.        . LUTEIN PO Take 20 mg by mouth daily.       . metoprolol tartrate (LOPRESSOR) 25 MG tablet Take 1 tablet by mouth Twice daily.      . Multiple Vitamin (MULTIVITAMIN) tablet Take 1 tablet by mouth daily.        . Multiple Vitamins-Minerals (PRESERVISION AREDS PO) Take 1 capsule by mouth 2 (two) times daily.        [name] NITROSTAT 0.4 MG SL tablet as directed.      [name] omeprazole (PRILOSEC) 20 MG capsule Take 20 mg by mouth daily.      [name] ZETIA 10 MG tablet Take 10 mg by mouth daily.       [name] zolpidem (AMBIEN) 5 MG tablet Take 5 mg by mouth at bedtime.         No current facility-administered medications for this visit.     No Known Allergies  REVIEW OF SYSTEMS: Skin:  No history of rash.  No history of abnormal moles. Infection:  No history of hepatitis or HIV.  No history of MRSA. Neurologic:  No history of stroke.  No history of seizure.  No history of headaches. Cardiac:  Hypertension. Has AI.  Had CABG in 1975.  Followed by Dr. Corky Downs.  Last stress test 2012. Pulmonary:  Has seen Dr. Tia Masker for asthma.  Endocrine:  No diabetes. No thyroid disease. Gastrointestinal:  No history of liver disease.  No history of gall bladder disease.  No history of pancreas disease.  No history of colon disease.  See HPI.  By abdominal CT scan 07/23/2009, most of his stomach is in [name] large hiatal hernia above the diaphragm. Urologic:  Takes Proscar for BPH.  Status post percutaneous cryoablation of left renal tumor on 10/17/2009 Musculoskeletal:  No history of joint or back disease. Hematologic:  Saw [name] for thrombocytosis.  He said he has been using Retin-[name] on his hands and this can cause thrombocytosis. Psycho-social:  The patient is oriented.   The patient has no obvious psychologic or social impairment to understanding our conversation and plan.  SOCIAL and FAMILY HISTORY: Semi retired physician Has 3 children - 2 physicians, daughter  is in Physicist, medical.  PHYSICAL EXAM: BP 112/56  Pulse 60  Resp 14  Ht 5\' 7"  (1.702 m)  Wt 137  lb 9.6 oz (62.415 kg)  BMI 21.55 kg/m2  General: WN thin older WM who is alert and generally healthy appearing.  Pleasant personality. HEENT: Normal. Pupils equal. Neck: Supple. No mass.  No thyroid mass. Lymph Nodes:  No supraclavicular or cervical nodes. Lungs: Clear to auscultation and symmetric breath sounds.  Median sternotomy scar. Heart:  RRR. 3/6 systolic murmur (though he says it is AI) Abdomen: Soft. No mass. No tenderness. Normal bowel sounds.  Right lower abdominal paramedian scar.  Small reducible left inguinal hernia.  He says that it has been much larger than what he is showing me today. Rectal: Not done. Extremities:  Good strength and ROM  in upper and lower extremities. Neurologic:  Grossly intact to motor and sensory function. Psychiatric: Has normal mood and affect. Behavior is normal.   DATA REVIEWED: Epic notes.  Alphonsa Overall, MD,  [facility], [address]  [place], [place]    [place] Phone:  [phone] FAX:  [phone]

## 2012-11-01 DIAGNOSIS — I129 Hypertensive chronic kidney disease with stage 1 through stage 4 chronic kidney disease, or unspecified chronic kidney disease: Secondary | ICD-10-CM | POA: Diagnosis not present

## 2012-11-01 DIAGNOSIS — I2510 Atherosclerotic heart disease of native coronary artery without angina pectoris: Secondary | ICD-10-CM | POA: Diagnosis not present

## 2012-11-01 DIAGNOSIS — N183 Chronic kidney disease, stage 3 unspecified: Secondary | ICD-10-CM | POA: Diagnosis not present

## 2012-11-04 ENCOUNTER — Ambulatory Visit (INDEPENDENT_AMBULATORY_CARE_PROVIDER_SITE_OTHER): Payer: Self-pay | Admitting: Surgery

## 2012-11-12 DIAGNOSIS — L821 Other seborrheic keratosis: Secondary | ICD-10-CM | POA: Diagnosis not present

## 2012-11-19 DIAGNOSIS — H1830 Unspecified corneal membrane change: Secondary | ICD-10-CM | POA: Diagnosis not present

## 2012-11-19 DIAGNOSIS — H353190 Nonexudative age-related macular degeneration, unspecified eye, stage unspecified: Secondary | ICD-10-CM | POA: Diagnosis not present

## 2012-11-19 DIAGNOSIS — H2510 Age-related nuclear cataract, unspecified eye: Secondary | ICD-10-CM | POA: Diagnosis not present

## 2012-11-19 DIAGNOSIS — H35369 Drusen (degenerative) of macula, unspecified eye: Secondary | ICD-10-CM | POA: Diagnosis not present

## 2012-11-19 DIAGNOSIS — H35729 Serous detachment of retinal pigment epithelium, unspecified eye: Secondary | ICD-10-CM | POA: Diagnosis not present

## 2012-11-26 DIAGNOSIS — IMO0002 Reserved for concepts with insufficient information to code with codable children: Secondary | ICD-10-CM | POA: Diagnosis not present

## 2012-11-29 DIAGNOSIS — J45909 Unspecified asthma, uncomplicated: Secondary | ICD-10-CM | POA: Diagnosis not present

## 2012-11-29 DIAGNOSIS — D759 Disease of blood and blood-forming organs, unspecified: Secondary | ICD-10-CM | POA: Diagnosis not present

## 2012-11-29 DIAGNOSIS — I2510 Atherosclerotic heart disease of native coronary artery without angina pectoris: Secondary | ICD-10-CM | POA: Diagnosis not present

## 2012-11-29 DIAGNOSIS — M5430 Sciatica, unspecified side: Secondary | ICD-10-CM | POA: Diagnosis not present

## 2012-11-30 ENCOUNTER — Encounter: Payer: Self-pay | Admitting: Cardiovascular Disease

## 2012-11-30 ENCOUNTER — Ambulatory Visit (INDEPENDENT_AMBULATORY_CARE_PROVIDER_SITE_OTHER): Payer: Medicare Other | Admitting: Cardiovascular Disease

## 2012-11-30 VITALS — BP 116/50 | HR 53 | Ht 66.0 in | Wt 138.2 lb

## 2012-11-30 DIAGNOSIS — I2510 Atherosclerotic heart disease of native coronary artery without angina pectoris: Secondary | ICD-10-CM

## 2012-11-30 DIAGNOSIS — E785 Hyperlipidemia, unspecified: Secondary | ICD-10-CM | POA: Diagnosis not present

## 2012-11-30 DIAGNOSIS — I10 Essential (primary) hypertension: Secondary | ICD-10-CM | POA: Diagnosis not present

## 2012-11-30 DIAGNOSIS — I25810 Atherosclerosis of coronary artery bypass graft(s) without angina pectoris: Secondary | ICD-10-CM

## 2012-11-30 DIAGNOSIS — K449 Diaphragmatic hernia without obstruction or gangrene: Secondary | ICD-10-CM

## 2012-11-30 DIAGNOSIS — R011 Cardiac murmur, unspecified: Secondary | ICD-10-CM | POA: Diagnosis not present

## 2012-11-30 NOTE — Patient Instructions (Signed)
Your physician recommends that you schedule a follow-up appointment in: 6 months  Your physician recommends that you return for lab work in: 6 months   

## 2012-12-09 ENCOUNTER — Other Ambulatory Visit: Payer: Self-pay | Admitting: *Deleted

## 2012-12-09 MED ORDER — LOSARTAN POTASSIUM 25 MG PO TABS: 25.0000 mg | ORAL_TABLET | Freq: Every day | ORAL | Status: DC

## 2012-12-09 MED ORDER — METOPROLOL TARTRATE 25 MG PO TABS: 25.0000 mg | ORAL_TABLET | Freq: Two times a day (BID) | ORAL | Status: DC

## 2012-12-09 NOTE — Telephone Encounter (Signed)
Refilled patient's losartan and metoprolol to [facility] per his request.

## 2012-12-12 ENCOUNTER — Encounter: Payer: Self-pay | Admitting: Cardiovascular Disease

## 2012-12-12 DIAGNOSIS — K449 Diaphragmatic hernia without obstruction or gangrene: Secondary | ICD-10-CM | POA: Insufficient documentation

## 2012-12-12 DIAGNOSIS — I25810 Atherosclerosis of coronary artery bypass graft(s) without angina pectoris: Secondary | ICD-10-CM | POA: Insufficient documentation

## 2012-12-12 NOTE — Progress Notes (Signed)
Patient ID: Alexander [name], male   DOB: 1921/09/09, 79 y.o.   MRN: [id number]     HPI: AMAURI HOCHSTETLER, is a 79 y.o. male who presents to the office for a six-month followup cardiologic evaluation.  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 In 1996 while living in [place] he underwent CABG surgery and had a LIMA placed to the LAD, vein to the marginal, vein to the ramus intermediate, and vein to the PDA. In October 2011 catheterization showed severe CAD with 70 - 80% ostial left main stenosis, total occlusion of the LAD after diagonal, 95% ramus intermediate stenosis, total occlusion of the marginal branch of the circumflex coronary artery, and severe diffuse native RCA disease with total occlusion of the PDA at the ostium. A graft that supplied the marginal was occluded as was the vein graft that supplied the ramus intermediate vessel. He had pain vein graft supplying the PDA branch of his RCA and a patent LIMA to the LAD. He has been on medical therapy. He does have mild renal insufficiency with creatinines in the 1.5-1.6 range which has been followed by [name]. He has a hiatal hernia. He had been on Crestor as well as Vytorin in the past but admits to some intolerance to statin. He does take Zetia 10 mg.   he denies recent chest pain. He denies significant shortness of breath.   Past Medical History  Diagnosis Date  . Hypertension   . Hyperlipidemia   . GERD (gastroesophageal reflux disease)   . Spinal stenosis   . Migraines     OCULAR  . BPH (benign prostatic hyperplasia)   . CKD (chronic kidney disease), stage III   . Vitamin deficiency   . Diverticulitis   . Heart murmur   . Calcium oxalate renal stones   . Renal lesion   . Dry senile macular degeneration   . Allergic rhinitis   . Anemia   . Peripheral neuropathy   . Thrombocytosis 08/18/2012    Platelets 711,000  Hb 14, WBC 8,500 08/05/12    Past Surgical History  Procedure Laterality  Date  . Tonsillectomy  1937  . Appendectomy  1942  . Inguinal hernia repair      x2  . Coronary artery bypass graft  1996  . Nasal concha bullosa resection    . Laparoscopic cholecystectomy  1992  . Left renal mass ablated  12/11    No Known Allergies  Current Outpatient Prescriptions  Medication Sig Dispense Refill  . aluminum & magnesium hydroxide-simethicone (MYLANTA) 500-450-40 MG/5ML suspension Take by mouth every 6 (six) hours as needed. 200-200-20mg /12ml       . aspirin 81 MG tablet Take 81 mg by mouth daily.        . budesonide (PULMICORT FLEXHALER) 180 MCG/ACT inhaler Inhale 1 puff into the lungs daily.  3 Inhaler  3  . cholecalciferol (VITAMIN D) 1000 UNITS tablet Take 2,000 Units by mouth daily.       . Coenzyme Q10 (CO Q-10) 300 MG CAPS Take 1 capsule by mouth daily.      . Cyanocobalamin (VITAMIN B-12 IJ) Inject as directed. monthly       . Docusate Calcium (STOOL SOFTENER PO) Take 1 capsule by mouth 2 (two) times daily as needed.       . finasteride (PROSCAR) 5 MG tablet Take 1 tablet by mouth 3 (three) times a week. TIW      .  fluticasone (FLONASE) 50 MCG/ACT nasal spray Place 1 spray into the nose as needed.       . LUTEIN PO Take 20 mg by mouth daily.       . Multiple Vitamin (MULTIVITAMIN) tablet Take 1 tablet by mouth daily.        . Multiple Vitamins-Minerals (PRESERVISION AREDS PO) Take 1 capsule by mouth 2 (two) times daily.        [name] NITROSTAT 0.4 MG SL tablet as directed.      [name] omeprazole (PRILOSEC) 20 MG capsule Take 20 mg by mouth daily.      [name] ZETIA 10 MG tablet Take 10 mg by mouth daily.       [name] losartan (COZAAR) 25 MG tablet Take 1 tablet (25 mg total) by mouth daily.  90 tablet  3  . metoprolol tartrate (LOPRESSOR) 25 MG tablet Take 1 tablet (25 mg total) by mouth 2 (two) times daily.  180 tablet  3  . zolpidem (AMBIEN) 5 MG tablet Take 5 mg by mouth at bedtime.         No current facility-administered medications for this visit.    History   Social  History  . Marital Status: Widowed    Spouse Name: N/A    Number of Children: 3  . Years of Education: N/A   Occupational History  . Not on file.   Social History Main Topics  . Smoking status: Never Smoker   . Smokeless tobacco: Never Used  . Alcohol Use: No  . Drug Use: No  . Sexually Active: Not on file   Other Topics Concern  . Not on file   Social History Narrative  . No narrative on file    Socially he is widowed. He has 3 children and 9 grandchildren. There's no tobacco use.  ROS is negative for fevers, chills or night sweats. He denies dizziness. He denies recent increase in chest pain symptoms. He denies bleeding issues but has had issues with elevation of platelet count followed by [name]. At times he notes indigestion and has a large hiatal hernia.  He denies bleeding. He denies significant edema. He denies paresthesias. Other system review is negative.  PE BP 116/50  Pulse 53  Ht 5\' 6"  (1.676 m)  Wt 138 lb 3.2 oz (62.687 kg)  BMI 22.32 kg/m2  General: Alert, oriented, no distress.  Skin: normal turgor, no rashes HEENT: Normocephalic, atraumatic. Pupils round and reactive; sclera anicteric;no lid lag.  Nose without nasal septal hypertrophy Mouth/Parynx benign; Mallinpatti scale 2 Neck: No JVD, no carotid briuts Lungs: clear to ausculatation and percussion; no wheezing or rales Heart: RRR, s1 s2 normal [id number] diastolic murmur compatible with his documented aortic insufficiency. Abdomen: soft, nontender; no hepatosplenomehaly, BS+; abdominal aorta nontender and not dilated by palpation. Pulses 2+ Extremities: no clubbing cyanosis or edema, Homan's sign negative  Neurologic: grossly nonfocal  ECG: Sinus rhythm at 53 beats per minute. Normal intervals.  LABS:  BMET    Component Value Date/Time   NA 142 09/01/2012 0950   NA 139 10/18/2009 0330   K 4.1 09/01/2012 0950   K 4.5 10/18/2009 0330   CL 108* 09/01/2012 0950   CL 108 10/18/2009 0330   CO2 24  09/01/2012 0950   CO2 23 10/18/2009 0330   GLUCOSE 116* 09/01/2012 0950   GLUCOSE 108* 10/18/2009 0330   BUN 27.9* 09/01/2012 0950   BUN 21 10/18/2009 0330   CREATININE 1.6* 09/01/2012 0950   CREATININE 1.49  10/18/2009 0330   CALCIUM 9.3 09/01/2012 0950   CALCIUM 9.0 10/18/2009 0330   GFRNONAA 45* 10/18/2009 0330   GFRAA  Value: 54        The eGFR has been calculated using the MDRD equation. This calculation has not been validated in all clinical situations. eGFR's persistently <60 mL/min signify possible Chronic Kidney Disease.* 10/18/2009 0330     Hepatic Function Panel     Component Value Date/Time   PROT 7.3 09/01/2012 0950   ALBUMIN 3.5 09/01/2012 0950   AST 22 09/01/2012 0950   ALT 16 09/01/2012 0950   ALKPHOS 82 09/01/2012 0950   BILITOT 0.40 09/01/2012 0950     CBC    Component Value Date/Time   WBC 7.4 09/01/2012 0950   WBC 15.6* 10/18/2009 0330   RBC 4.30 09/01/2012 0950   RBC 4.25 10/18/2009 0330   HGB 13.3 09/01/2012 0950   HGB 13.6 10/18/2009 0330   HCT 41.0 09/01/2012 0950   HCT 39.0 10/18/2009 0330   PLT 703* 09/01/2012 0950   PLT 329 10/18/2009 0330   MCV 95.3 09/01/2012 0950   MCV 91.8 10/18/2009 0330   MCH 30.9 09/01/2012 0950   MCHC 32.4 09/01/2012 0950   MCHC 34.8 10/18/2009 0330   RDW 14.6 09/01/2012 0950   RDW 13.1 10/18/2009 0330   LYMPHSABS 1.8 09/01/2012 0950   MONOABS 0.5 09/01/2012 0950   EOSABS 0.3 09/01/2012 0950   BASOSABS 0.3* 09/01/2012 0950     BNP No results found for this basename: probnp    Lipid Panel  No results found for this basename: chol, trig, hdl, cholhdl, vldl, ldlcalc     RADIOLOGY: No results found.    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 He has documented graft occlusion of 2 of his grafts and has severe native coronary artery disease. Remotely, we tried adding Ranexa to his medication but he has stopped this due to concerns. His renal function appears stable.  He's not having any significant increase in symptomatology. He has not wanted to take a statin but he is tolerating Zetia. His blood pressure is well-controlled. We will recheck laboratory in 6 months at which time we will do a CBC CMP, NMR, and TSH level. I will see him in followup and additional recommendations will be made at that time.     Troy Sine, MD, [facility]  12/12/2012 10:30 AM

## 2012-12-15 ENCOUNTER — Other Ambulatory Visit: Payer: Self-pay

## 2012-12-15 MED ORDER — METOPROLOL TARTRATE 25 MG PO TABS: 25.0000 mg | ORAL_TABLET | Freq: Two times a day (BID) | ORAL | Status: DC

## 2012-12-15 NOTE — Telephone Encounter (Signed)
Rx was sent to pharmacy electronically. 

## 2012-12-29 DIAGNOSIS — Z79899 Other long term (current) drug therapy: Secondary | ICD-10-CM | POA: Diagnosis not present

## 2013-01-21 DIAGNOSIS — H353290 Exudative age-related macular degeneration, unspecified eye, stage unspecified: Secondary | ICD-10-CM | POA: Diagnosis not present

## 2013-01-21 DIAGNOSIS — H2510 Age-related nuclear cataract, unspecified eye: Secondary | ICD-10-CM | POA: Diagnosis not present

## 2013-01-21 DIAGNOSIS — H1830 Unspecified corneal membrane change: Secondary | ICD-10-CM | POA: Diagnosis not present

## 2013-01-21 DIAGNOSIS — H35729 Serous detachment of retinal pigment epithelium, unspecified eye: Secondary | ICD-10-CM | POA: Diagnosis not present

## 2013-01-21 DIAGNOSIS — H353190 Nonexudative age-related macular degeneration, unspecified eye, stage unspecified: Secondary | ICD-10-CM | POA: Diagnosis not present

## 2013-01-21 DIAGNOSIS — H35369 Drusen (degenerative) of macula, unspecified eye: Secondary | ICD-10-CM | POA: Diagnosis not present

## 2013-02-04 DIAGNOSIS — Z23 Encounter for immunization: Secondary | ICD-10-CM | POA: Diagnosis not present

## 2013-02-23 DIAGNOSIS — H353290 Exudative age-related macular degeneration, unspecified eye, stage unspecified: Secondary | ICD-10-CM | POA: Diagnosis not present

## 2013-02-25 DIAGNOSIS — L600 Ingrowing nail: Secondary | ICD-10-CM | POA: Diagnosis not present

## 2013-03-02 ENCOUNTER — Encounter: Payer: Self-pay | Admitting: Critical Care Medicine

## 2013-03-02 ENCOUNTER — Ambulatory Visit (INDEPENDENT_AMBULATORY_CARE_PROVIDER_SITE_OTHER): Payer: Medicare Other | Admitting: Critical Care Medicine

## 2013-03-02 VITALS — BP 124/62 | HR 48 | Temp 97.7°F | Ht 66.5 in | Wt 136.0 lb

## 2013-03-02 DIAGNOSIS — J45909 Unspecified asthma, uncomplicated: Secondary | ICD-10-CM | POA: Diagnosis not present

## 2013-03-02 DIAGNOSIS — J4540 Moderate persistent asthma, uncomplicated: Secondary | ICD-10-CM

## 2013-03-02 NOTE — Patient Instructions (Signed)
Stop pulmicort Give [place] an email message or call in two weeks on how you are doing If you want an allergy evaluation let me know REturn 4 months

## 2013-03-02 NOTE — Progress Notes (Signed)
Subjective:    Patient ID: Alexander [name], male    DOB: 1921-07-06, 79 y.o.   MRN: [id number]  HPI  03/02/2013 Chief Complaint  Patient presents with  . 6 month follow up    Breathing has been doing well overall.  Does note hoarsness and raspy voice at times, SOB when singing or with exertion, and white to gray mucus mainly in the mornings.  Still with hoarse voice and white gray mucus. Occ wheeze in am, once mucus is cleared is better.Pt on pulmicort 1 puff qd.        Past Medical History  Diagnosis Date  . Hypertension   . Hyperlipidemia   . GERD (gastroesophageal reflux disease)   . Spinal stenosis   . Migraines     OCULAR  . BPH (benign prostatic hyperplasia)   . CKD (chronic kidney disease), stage III   . Vitamin deficiency   . Diverticulitis   . Heart murmur   . Calcium oxalate renal stones   . Renal lesion   . Dry senile macular degeneration   . Allergic rhinitis   . Anemia   . Peripheral neuropathy   . Thrombocytosis 08/18/2012    Platelets 711,000  Hb 14, WBC 8,500 08/05/12     Family History  Problem Relation Age of Onset  . Allergies Daughter   . Asthma Grandchild   . Heart failure Mother   . Coronary artery disease Maternal Grandfather      History   Social History  . Marital Status: Widowed    Spouse Name: N/A    Number of Children: 3  . Years of Education: N/A   Occupational History  . Not on file.   Social History Main Topics  . Smoking status: Never Smoker   . Smokeless tobacco: Never Used  . Alcohol Use: No  . Drug Use: No  . Sexual Activity: Not on file   Other Topics Concern  . Not on file   Social History Narrative  . No narrative on file     No Known Allergies   Outpatient Prescriptions Prior to Visit  Medication Sig Dispense Refill  . aluminum & magnesium hydroxide-simethicone (MYLANTA) 500-450-40 MG/5ML suspension Take by mouth every 6 (six) hours as needed. 200-200-20mg /41ml       . aspirin 81 MG tablet Take 81 mg by  mouth daily.        . cholecalciferol (VITAMIN D) 1000 UNITS tablet Take 2,000 Units by mouth daily.       . Coenzyme Q10 (CO Q-10) 300 MG CAPS Take 1 capsule by mouth daily.      . Cyanocobalamin (VITAMIN B-12 IJ) Inject as directed. monthly       . Docusate Calcium (STOOL SOFTENER PO) Take 1 capsule by mouth 2 (two) times daily as needed.       . finasteride (PROSCAR) 5 MG tablet Take 1 tablet by mouth 3 (three) times a week. TIW      . losartan (COZAAR) 25 MG tablet Take 1 tablet (25 mg total) by mouth daily.  90 tablet  3  . LUTEIN PO Take 20 mg by mouth daily.       . metoprolol tartrate (LOPRESSOR) 25 MG tablet Take 1 tablet (25 mg total) by mouth 2 (two) times daily.  180 tablet  3  . Multiple Vitamin (MULTIVITAMIN) tablet Take 1 tablet by mouth daily.        . Multiple Vitamins-Minerals (PRESERVISION AREDS PO) Take 1 capsule by mouth  2 (two) times daily.        [name] omeprazole (PRILOSEC) 20 MG capsule Take 20 mg by mouth daily.      [name] ZETIA 10 MG tablet Take 10 mg by mouth daily.       . budesonide (PULMICORT FLEXHALER) 180 MCG/ACT inhaler Inhale 1 puff into the lungs daily.  3 Inhaler  3  . fluticasone (FLONASE) 50 MCG/ACT nasal spray Place 1 spray into the nose as needed.       [name] NITROSTAT 0.4 MG SL tablet as directed.      . zolpidem (AMBIEN) 5 MG tablet Take 5 mg by mouth at bedtime.         No facility-administered medications prior to visit.       Review of Systems  Constitutional: Negative for diaphoresis, activity change, fatigue and unexpected weight change.  HENT: Positive for congestion, hearing loss and voice change. Negative for dental problem, ear discharge, facial swelling, mouth sores, nosebleeds, sinus pressure and tinnitus.        No dysphagia No sore throat Is hoarse  Eyes: Positive for itching. Negative for photophobia, discharge and visual disturbance.  Respiratory: Negative for apnea, choking, chest tightness and stridor.   Cardiovascular: Negative for  palpitations.  Gastrointestinal: Negative for nausea, constipation, blood in stool and abdominal distention.  Genitourinary: Negative for dysuria, urgency, frequency, hematuria, flank pain, decreased urine volume and difficulty urinating.  Musculoskeletal: Positive for gait problem. Negative for arthralgias, back pain, joint swelling and neck stiffness.       Balance issue   Skin: Negative for color change and pallor.  Neurological: Positive for weakness and numbness. Negative for dizziness, tremors, seizures, syncope, speech difficulty and light-headedness.  Hematological: Negative for adenopathy. Does not bruise/bleed easily.  Psychiatric/Behavioral: Negative for confusion, sleep disturbance and agitation. The patient is not nervous/anxious.        Objective:   Physical Exam  Filed Vitals:   03/02/13 1014  BP: 124/62  Pulse: 48  Temp: 97.7 F (36.5 C)  TempSrc: Oral  Height: 5' 6.5" (1.689 m)  Weight: 136 lb (61.689 kg)  SpO2: 96%    Gen: Pleasant, well-nourished, in no distress,  normal affect  ENT: No lesions,  mouth clear,  oropharynx clear, no postnasal drip  Neck: No JVD, no TMG, no carotid bruits  Lungs: No use of accessory muscles, no dullness to percussion, improved airflow  Cardiovascular: RRR, heart sounds normal, no murmur or gallops, no peripheral edema  Abdomen: soft and NT, no HSM,  BS normal  Musculoskeletal: No deformities, no cyanosis or clubbing  Neuro: alert, non focal  Skin: Warm, no lesions or rashes      Assessment & Plan:   Moderate persistent asthma without complication Wean off pulmicort  Pt to call with results    Updated Medication List Outpatient Encounter Prescriptions as of 03/02/2013  Medication Sig  . aluminum & magnesium hydroxide-simethicone (MYLANTA) 500-450-40 MG/5ML suspension Take by mouth every 6 (six) hours as needed. 200-200-20mg /37ml   . aspirin 81 MG tablet Take 81 mg by mouth daily.    . cephALEXin (KEFLEX) 500  MG capsule Take 1 capsule by mouth 3 (three) times daily.  . cholecalciferol (VITAMIN D) 1000 UNITS tablet Take 2,000 Units by mouth daily.   . Coenzyme Q10 (CO Q-10) 300 MG CAPS Take 1 capsule by mouth daily.  . Cyanocobalamin (VITAMIN B-12 IJ) Inject as directed. monthly   . Docusate Calcium (STOOL SOFTENER PO) Take 1 capsule by mouth 2 (  two) times daily as needed.   . Doxylamine Succinate, Sleep, (SLEEP AID PO) Take 1 tablet by mouth at bedtime.  . finasteride (PROSCAR) 5 MG tablet Take 1 tablet by mouth 3 (three) times a week. TIW  . losartan (COZAAR) 25 MG tablet Take 1 tablet (25 mg total) by mouth daily.  . LUTEIN PO Take 20 mg by mouth daily.   . metoprolol tartrate (LOPRESSOR) 25 MG tablet Take 1 tablet (25 mg total) by mouth 2 (two) times daily.  . Multiple Vitamin (MULTIVITAMIN) tablet Take 1 tablet by mouth daily.    . Multiple Vitamins-Minerals (PRESERVISION AREDS PO) Take 1 capsule by mouth 2 (two) times daily.    [name] omeprazole (PRILOSEC) 20 MG capsule Take 20 mg by mouth daily.  [name] ZETIA 10 MG tablet Take 10 mg by mouth daily.   . [DISCONTINUED] budesonide (PULMICORT FLEXHALER) 180 MCG/ACT inhaler Inhale 1 puff into the lungs daily.  . fluticasone (FLONASE) 50 MCG/ACT nasal spray Place 1 spray into the nose as needed.   [name] NITROSTAT 0.4 MG SL tablet as directed.  . [DISCONTINUED] zolpidem (AMBIEN) 5 MG tablet Take 5 mg by mouth at bedtime.

## 2013-03-04 DIAGNOSIS — L600 Ingrowing nail: Secondary | ICD-10-CM | POA: Diagnosis not present

## 2013-03-04 DIAGNOSIS — L608 Other nail disorders: Secondary | ICD-10-CM | POA: Diagnosis not present

## 2013-03-04 NOTE — Assessment & Plan Note (Signed)
Wean off pulmicort  Pt to call with results

## 2013-03-16 DIAGNOSIS — H524 Presbyopia: Secondary | ICD-10-CM | POA: Diagnosis not present

## 2013-03-16 DIAGNOSIS — H3530 Unspecified macular degeneration: Secondary | ICD-10-CM | POA: Diagnosis not present

## 2013-03-16 DIAGNOSIS — H2510 Age-related nuclear cataract, unspecified eye: Secondary | ICD-10-CM | POA: Diagnosis not present

## 2013-03-17 DIAGNOSIS — L600 Ingrowing nail: Secondary | ICD-10-CM | POA: Diagnosis not present

## 2013-03-17 DIAGNOSIS — L608 Other nail disorders: Secondary | ICD-10-CM | POA: Diagnosis not present

## 2013-03-18 ENCOUNTER — Ambulatory Visit (HOSPITAL_BASED_OUTPATIENT_CLINIC_OR_DEPARTMENT_OTHER): Payer: Medicare Other | Admitting: Oncology

## 2013-03-18 ENCOUNTER — Other Ambulatory Visit (HOSPITAL_BASED_OUTPATIENT_CLINIC_OR_DEPARTMENT_OTHER): Payer: Medicare Other

## 2013-03-18 ENCOUNTER — Other Ambulatory Visit: Payer: Self-pay | Admitting: *Deleted

## 2013-03-18 VITALS — BP 142/52 | HR 50 | Temp 97.4°F | Resp 17 | Ht 66.5 in | Wt 135.7 lb

## 2013-03-18 DIAGNOSIS — D75839 Thrombocytosis, unspecified: Secondary | ICD-10-CM

## 2013-03-18 DIAGNOSIS — N183 Chronic kidney disease, stage 3 unspecified: Secondary | ICD-10-CM

## 2013-03-18 DIAGNOSIS — D473 Essential (hemorrhagic) thrombocythemia: Secondary | ICD-10-CM | POA: Diagnosis not present

## 2013-03-18 LAB — BASIC METABOLIC PANEL (CC13)
BUN: 28.2 mg/dL — ABNORMAL HIGH (ref 7.0–26.0)
CO2: 23 mEq/L (ref 22–29)
Chloride: 110 mEq/L — ABNORMAL HIGH (ref 98–109)
Glucose: 84 mg/dl (ref 70–140)
Potassium: 4.5 mEq/L (ref 3.5–5.1)
Sodium: 142 mEq/L (ref 136–145)

## 2013-03-18 LAB — CBC & DIFF AND RETIC
BASO%: 3.4 % — ABNORMAL HIGH (ref 0.0–2.0)
EOS%: 3.1 % (ref 0.0–7.0)
HCT: 39.3 % (ref 38.4–49.9)
Immature Retic Fract: 12.7 % — ABNORMAL HIGH (ref 3.00–10.60)
MCHC: 32.3 g/dL (ref 32.0–36.0)
MONO#: 0.7 10*3/uL (ref 0.1–0.9)
NEUT%: 61.7 % (ref 39.0–75.0)
RBC: 4.12 10*6/uL — ABNORMAL LOW (ref 4.20–5.82)
RDW: 15 % — ABNORMAL HIGH (ref 11.0–14.6)
Retic %: 1.78 % (ref 0.80–1.80)
Retic Ct Abs: 73.34 10*3/uL (ref 34.80–93.90)
WBC: 9.5 10*3/uL (ref 4.0–10.3)
lymph#: 2.3 10*3/uL (ref 0.9–3.3)
nRBC: 0 % (ref 0–0)

## 2013-03-18 NOTE — Progress Notes (Signed)
Hematology and Oncology Follow Up Visit  Alexander [name] [id number] 1921-12-18 79 y.o. 03/18/2013 6:25 PM   Principle Diagnosis: Encounter Diagnosis  Name Primary?  . 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 Gene mutation documented on blood sample from 09/01/2012. He had normal platelet counts back in 2011. Platelet count now in the range of 700,000 with hemoglobin 13, MCV 95, and normal white count with normal differential. No early myeloid cells on review of peripheral blood film. He has never had any neurologic symptoms. He has never had a thrombotic event. I advised aspirin 81 mg alone. I advised against using platelet lowering drugs at this point.  He has had no interim problems since last visit with me. He denies any headache, change in vision, slurred speech, focal weakness, or paresthesias. He just had surgery yesterday to remove  an  ingrown toenail by Dr. Durward Fortes.   Medications: reviewed  Allergies: No Known Allergies  Review of Systems:  negative.  Physical Exam: Blood pressure 142/52, pulse 50, temperature 97.4 F (36.3 C), temperature source Oral, resp. rate 17, height 5' 6.5" (1.689 m), weight 135 lb 11.2 oz (61.553 kg), SpO2 100.00%. Wt Readings from Last 3 Encounters:  03/18/13 135 lb 11.2 oz (61.553 kg)  03/02/13 136 lb (61.689 kg)  11/30/12 138 lb 3.2 oz (62.687 kg)     General appearance: Thin elderly man HENNT: Pharynx no erythema, exudate, mass, or ulcer. No thyromegaly or thyroid nodules Lymph nodes: No cervical, supraclavicular, or axillary lymphadenopathy Lungs: Clear to auscultation, resonant to percussion throughout Heart: Regular rhythm, no murmur, no gallop, no rub, no click, no edema Abdomen: Soft, nontender, normal bowel sounds, no mass, no organomegaly Extremities: No edema, no calf  tenderness Musculoskeletal: no joint deformities GU:  Vascular: Carotid pulses 2+, no bruits, Neurologic: Alert, oriented, PERRLA, optic discs sharp and vessels normal, no hemorrhage or exudate, cranial nerves grossly normal, motor strength 5 over 5, reflexes 1+ symmetric, upper body coordination normal, gait normal, mild decrease in vibration sensation by tuning fork exam over the fingertips Skin: No rash or ecchymosis  Lab Results: CBC W/Diff    Component Value Date/Time   WBC 9.5 03/18/2013 1437   WBC 15.6* 10/18/2009 0330   RBC 4.12* 03/18/2013 1437   RBC 4.25 10/18/2009 0330   HGB 12.7* 03/18/2013 1437   HGB 13.6 10/18/2009 0330   HCT 39.3 03/18/2013 1437   HCT 39.0 10/18/2009 0330   PLT 710* 03/18/2013 1437   PLT 329 10/18/2009 0330   MCV 95.4 03/18/2013 1437   MCV 91.8 10/18/2009 0330   MCH 30.8 03/18/2013 1437   MCHC 32.3 03/18/2013 1437   MCHC 34.8 10/18/2009 0330   RDW 15.0* 03/18/2013 1437   RDW 13.1 10/18/2009 0330   LYMPHSABS 2.3 03/18/2013 1437   MONOABS 0.7 03/18/2013 1437   EOSABS 0.3 03/18/2013 1437   BASOSABS 0.3* 03/18/2013 1437     Chemistry      Component Value Date/Time   NA 142 03/18/2013 1437   NA 139 10/18/2009 0330   K 4.5 03/18/2013 1437   K 4.5 10/18/2009 0330   CL 108* 09/01/2012 0950   CL 108 10/18/2009 0330   CO2 23 03/18/2013 1437   CO2 23 10/18/2009 0330   BUN 28.2* 03/18/2013 1437   BUN 21 10/18/2009 0330   CREATININE 1.6* 03/18/2013 1437   CREATININE 1.49 10/18/2009 0330      Component  Value Date/Time   CALCIUM 9.7 03/18/2013 1437   CALCIUM 9.0 10/18/2009 0330   ALKPHOS 82 09/01/2012 0950   AST 22 09/01/2012 0950   ALT 16 09/01/2012 0950   BILITOT 0.40 09/01/2012 0950        Impression Essential thrombocythemia Stable blood counts Continue aspirin for thrombotic prophylaxis   CC: Patient Care Team: Jerlyn Ly, MD as PCP - General (Internal Medicine) Elsie Stain, MD as Attending Physician (Pulmonary Disease) Elsie Stain,  MD as Consulting Physician (Pulmonary Disease) Troy Sine, MD as Consulting Physician (Cardiology) Annia Belt, MD as Consulting Physician (Hematology and Oncology) Mayme Genta, MD as Consulting Physician (Gastroenterology)   Annia Belt, MD 11/14/20146:25 PM

## 2013-03-21 DIAGNOSIS — L820 Inflamed seborrheic keratosis: Secondary | ICD-10-CM | POA: Diagnosis not present

## 2013-03-21 DIAGNOSIS — D2370 Other benign neoplasm of skin of unspecified lower limb, including hip: Secondary | ICD-10-CM | POA: Diagnosis not present

## 2013-03-21 DIAGNOSIS — L821 Other seborrheic keratosis: Secondary | ICD-10-CM | POA: Diagnosis not present

## 2013-04-13 ENCOUNTER — Ambulatory Visit
Admission: RE | Admit: 2013-04-13 | Discharge: 2013-04-13 | Disposition: A | Discharge status: Home / Self Care | Payer: Medicare Other | Source: Ambulatory Visit | Attending: Gastroenterology | Admitting: Gastroenterology

## 2013-04-13 ENCOUNTER — Other Ambulatory Visit: Payer: Self-pay | Admitting: Gastroenterology

## 2013-04-13 DIAGNOSIS — R1903 Right lower quadrant abdominal swelling, mass and lump: Secondary | ICD-10-CM

## 2013-04-13 DIAGNOSIS — N281 Cyst of kidney, acquired: Secondary | ICD-10-CM | POA: Diagnosis not present

## 2013-04-19 ENCOUNTER — Other Ambulatory Visit: Payer: Medicare Other

## 2013-04-22 DIAGNOSIS — H903 Sensorineural hearing loss, bilateral: Secondary | ICD-10-CM | POA: Diagnosis not present

## 2013-04-25 ENCOUNTER — Other Ambulatory Visit: Payer: Self-pay | Admitting: Otolaryngology

## 2013-04-25 DIAGNOSIS — H9190 Unspecified hearing loss, unspecified ear: Secondary | ICD-10-CM

## 2013-04-26 ENCOUNTER — Other Ambulatory Visit: Payer: Self-pay | Admitting: *Deleted

## 2013-04-26 ENCOUNTER — Encounter: Payer: Self-pay | Admitting: *Deleted

## 2013-04-26 DIAGNOSIS — I2510 Atherosclerotic heart disease of native coronary artery without angina pectoris: Secondary | ICD-10-CM

## 2013-04-26 DIAGNOSIS — H9190 Unspecified hearing loss, unspecified ear: Secondary | ICD-10-CM

## 2013-05-01 ENCOUNTER — Ambulatory Visit
Admission: RE | Admit: 2013-05-01 | Discharge: 2013-05-01 | Disposition: A | Discharge status: Home / Self Care | Payer: Medicare Other | Source: Ambulatory Visit | Attending: Otolaryngology | Admitting: Otolaryngology

## 2013-05-01 DIAGNOSIS — G9389 Other specified disorders of brain: Secondary | ICD-10-CM | POA: Diagnosis not present

## 2013-05-04 DIAGNOSIS — J309 Allergic rhinitis, unspecified: Secondary | ICD-10-CM | POA: Diagnosis not present

## 2013-05-04 DIAGNOSIS — H903 Sensorineural hearing loss, bilateral: Secondary | ICD-10-CM | POA: Diagnosis not present

## 2013-05-04 DIAGNOSIS — H905 Unspecified sensorineural hearing loss: Secondary | ICD-10-CM | POA: Diagnosis not present

## 2013-05-06 DIAGNOSIS — H353190 Nonexudative age-related macular degeneration, unspecified eye, stage unspecified: Secondary | ICD-10-CM | POA: Diagnosis not present

## 2013-05-06 DIAGNOSIS — H2510 Age-related nuclear cataract, unspecified eye: Secondary | ICD-10-CM | POA: Diagnosis not present

## 2013-05-06 DIAGNOSIS — H1830 Unspecified corneal membrane change: Secondary | ICD-10-CM | POA: Diagnosis not present

## 2013-05-06 DIAGNOSIS — H35369 Drusen (degenerative) of macula, unspecified eye: Secondary | ICD-10-CM | POA: Diagnosis not present

## 2013-05-06 DIAGNOSIS — H35729 Serous detachment of retinal pigment epithelium, unspecified eye: Secondary | ICD-10-CM | POA: Diagnosis not present

## 2013-05-09 DIAGNOSIS — N183 Chronic kidney disease, stage 3 unspecified: Secondary | ICD-10-CM | POA: Diagnosis not present

## 2013-05-09 DIAGNOSIS — I10 Essential (primary) hypertension: Secondary | ICD-10-CM | POA: Diagnosis not present

## 2013-05-10 ENCOUNTER — Telehealth: Payer: Self-pay | Admitting: *Deleted

## 2013-05-10 NOTE — Telephone Encounter (Signed)
Returned a call to [name] in reference to labs needed. Patient states that he will be going to Dr. Silvestre Mesi office tomorrow to have blood work drawn. He wanted to know whether Dr. Claiborne Billings needed any labs at this time. Informed patient that a letter along with lab orders were mailed out to him on 04/26/13. He states that now that I have mentioned it, he did remember seeing the letter. I spoke with Estill Bamberg, Dr. Silvestre Mesi nurse and she informs me that she will be happy to draw the labs requested by Dr. Claiborne Billings. Lab orders reprinted and faxed to her attention @ [phone]. Patient notified of this.

## 2013-05-11 DIAGNOSIS — R7301 Impaired fasting glucose: Secondary | ICD-10-CM | POA: Diagnosis not present

## 2013-05-11 DIAGNOSIS — I2510 Atherosclerotic heart disease of native coronary artery without angina pectoris: Secondary | ICD-10-CM | POA: Diagnosis not present

## 2013-05-11 DIAGNOSIS — E785 Hyperlipidemia, unspecified: Secondary | ICD-10-CM | POA: Diagnosis not present

## 2013-05-11 DIAGNOSIS — E538 Deficiency of other specified B group vitamins: Secondary | ICD-10-CM | POA: Diagnosis not present

## 2013-05-11 DIAGNOSIS — M810 Age-related osteoporosis without current pathological fracture: Secondary | ICD-10-CM | POA: Diagnosis not present

## 2013-05-11 DIAGNOSIS — Z125 Encounter for screening for malignant neoplasm of prostate: Secondary | ICD-10-CM | POA: Diagnosis not present

## 2013-05-11 DIAGNOSIS — N183 Chronic kidney disease, stage 3 unspecified: Secondary | ICD-10-CM | POA: Diagnosis not present

## 2013-05-13 ENCOUNTER — Encounter: Payer: Self-pay | Admitting: Cardiovascular Disease

## 2013-05-16 DIAGNOSIS — N183 Chronic kidney disease, stage 3 unspecified: Secondary | ICD-10-CM | POA: Diagnosis not present

## 2013-05-16 DIAGNOSIS — D518 Other vitamin B12 deficiency anemias: Secondary | ICD-10-CM | POA: Diagnosis not present

## 2013-05-16 DIAGNOSIS — M810 Age-related osteoporosis without current pathological fracture: Secondary | ICD-10-CM | POA: Diagnosis not present

## 2013-05-16 DIAGNOSIS — Z0000 Encounter for general adult medical examination without abnormal findings: Secondary | ICD-10-CM | POA: Diagnosis not present

## 2013-05-16 DIAGNOSIS — K4090 Unilateral inguinal hernia, without obstruction or gangrene, not specified as recurrent: Secondary | ICD-10-CM | POA: Diagnosis not present

## 2013-05-16 DIAGNOSIS — J45909 Unspecified asthma, uncomplicated: Secondary | ICD-10-CM | POA: Diagnosis not present

## 2013-05-16 DIAGNOSIS — Z1331 Encounter for screening for depression: Secondary | ICD-10-CM | POA: Diagnosis not present

## 2013-05-16 DIAGNOSIS — I2510 Atherosclerotic heart disease of native coronary artery without angina pectoris: Secondary | ICD-10-CM | POA: Diagnosis not present

## 2013-06-01 DIAGNOSIS — H903 Sensorineural hearing loss, bilateral: Secondary | ICD-10-CM | POA: Diagnosis not present

## 2013-06-01 DIAGNOSIS — M810 Age-related osteoporosis without current pathological fracture: Secondary | ICD-10-CM | POA: Diagnosis not present

## 2013-06-01 DIAGNOSIS — J309 Allergic rhinitis, unspecified: Secondary | ICD-10-CM | POA: Diagnosis not present

## 2013-06-01 DIAGNOSIS — H905 Unspecified sensorineural hearing loss: Secondary | ICD-10-CM | POA: Diagnosis not present

## 2013-06-02 DIAGNOSIS — H903 Sensorineural hearing loss, bilateral: Secondary | ICD-10-CM | POA: Diagnosis not present

## 2013-06-02 DIAGNOSIS — J309 Allergic rhinitis, unspecified: Secondary | ICD-10-CM | POA: Diagnosis not present

## 2013-06-02 DIAGNOSIS — H905 Unspecified sensorineural hearing loss: Secondary | ICD-10-CM | POA: Diagnosis not present

## 2013-06-08 ENCOUNTER — Encounter: Payer: Self-pay | Admitting: Cardiovascular Disease

## 2013-06-08 ENCOUNTER — Ambulatory Visit (INDEPENDENT_AMBULATORY_CARE_PROVIDER_SITE_OTHER): Payer: Medicare Other | Admitting: Cardiovascular Disease

## 2013-06-08 VITALS — BP 122/70 | HR 49 | Ht 66.5 in | Wt 133.1 lb

## 2013-06-08 DIAGNOSIS — I2510 Atherosclerotic heart disease of native coronary artery without angina pectoris: Secondary | ICD-10-CM

## 2013-06-08 DIAGNOSIS — N183 Chronic kidney disease, stage 3 unspecified: Secondary | ICD-10-CM | POA: Diagnosis not present

## 2013-06-08 DIAGNOSIS — K219 Gastro-esophageal reflux disease without esophagitis: Secondary | ICD-10-CM

## 2013-06-08 DIAGNOSIS — I25810 Atherosclerosis of coronary artery bypass graft(s) without angina pectoris: Secondary | ICD-10-CM | POA: Diagnosis not present

## 2013-06-08 DIAGNOSIS — I10 Essential (primary) hypertension: Secondary | ICD-10-CM

## 2013-06-08 DIAGNOSIS — I359 Nonrheumatic aortic valve disorder, unspecified: Secondary | ICD-10-CM

## 2013-06-08 DIAGNOSIS — K449 Diaphragmatic hernia without obstruction or gangrene: Secondary | ICD-10-CM

## 2013-06-08 DIAGNOSIS — I351 Nonrheumatic aortic (valve) insufficiency: Secondary | ICD-10-CM

## 2013-06-08 NOTE — Patient Instructions (Signed)
Your physician recommends that you schedule a follow-up appointment in: 6 Months  

## 2013-06-09 DIAGNOSIS — H9120 Sudden idiopathic hearing loss, unspecified ear: Secondary | ICD-10-CM | POA: Diagnosis not present

## 2013-06-09 DIAGNOSIS — H905 Unspecified sensorineural hearing loss: Secondary | ICD-10-CM | POA: Diagnosis not present

## 2013-06-09 DIAGNOSIS — H903 Sensorineural hearing loss, bilateral: Secondary | ICD-10-CM | POA: Diagnosis not present

## 2013-06-12 ENCOUNTER — Encounter: Payer: Self-pay | Admitting: Cardiovascular Disease

## 2013-06-12 DIAGNOSIS — I351 Nonrheumatic aortic (valve) insufficiency: Secondary | ICD-10-CM | POA: Insufficient documentation

## 2013-06-12 NOTE — Progress Notes (Signed)
Patient ID: Alexander [name], Dr., male   DOB: 1922/01/16, 80 y.o.   MRN: [id number]      HPI: [name] is a 80 y.o. male who presents to the office for a six-month followup cardiologic evaluation.  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  In 1996 while living in [place] he underwent CABG surgery and had a LIMA placed to the LAD, vein to the marginal, vein to the ramus intermediate, and vein to the PDA. In October 2011 catheterization by me showed severe CAD with 53 - 80% ostial left main stenosis, total occlusion of the LAD after diagonal, 95% ramus intermediate stenosis, total occlusion of the marginal branch of the circumflex coronary artery, and severe diffuse native RCA disease with total occlusion of the PDA at the ostium. A graft that supplied the marginal was occluded as was the vein graft that supplied the ramus intermediate vessel. He had patent vein graft supplying the PDA branch of his RCA and a patent LIMA to the LAD. He has been on medical therapy. I did try adding Ranexa to his medical regimen but apparently he did not tolerate this. He does have mild renal insufficiency with creatinines in the 1.5-1.6 range which have been followed by [name] and since [name] retired with a new nephrologist in that group.[name] He has a hiatal hernia. He had been on Crestor as well as Vytorin in the past but admits to intolerance to statin and now just  takes Zetia 10 mg.   Over the past6 months he denies recurrent chest pain. He denies significant shortness of breath. He recently saw Dr. [name] and laboratory was drawn. I did contact Dr. previous office and obtain the results of the lab work shortly afterward [name] left his appointment today. This reveals a total cholesterol 155, triglycerides 187, HDL 29, LDL 89.  LDL particle #1079, HDL particle number of 24.1, small LDL particles at 775. His BUN is 31 creatinine 1.7 AST was normal at 19 ALT borderline  increased at 46. PSA normal at 0.58 the hemoglobin A1c 5.6  Past Medical History  Diagnosis Date  . Hypertension   . Hyperlipidemia   . GERD (gastroesophageal reflux disease)   . Spinal stenosis   . Migraines     OCULAR  . BPH (benign prostatic hyperplasia)   . CKD (chronic kidney disease), stage III   . Vitamin deficiency   . Diverticulitis   . Heart murmur   . Calcium oxalate renal stones   . Renal lesion   . Dry senile macular degeneration   . Allergic rhinitis   . Anemia   . Peripheral neuropathy   . Thrombocytosis 08/18/2012    Platelets 711,000  Hb 14, WBC 8,500 08/05/12    Past Surgical History  Procedure Laterality Date  . Tonsillectomy  1937  . Appendectomy  1942  . Inguinal hernia repair      x2  . Coronary artery bypass graft  1996  . Nasal concha bullosa resection    . Laparoscopic cholecystectomy  1992  . Left renal mass ablated  12/11    No Known Allergies  Current Outpatient Prescriptions  Medication Sig Dispense Refill  . aluminum & magnesium hydroxide-simethicone (MYLANTA) 500-450-40 MG/5ML suspension Take by mouth every 6 (six) hours as needed. 200-200-48m/5ml       . aspirin 81 MG tablet Take 81 mg by mouth daily.        .[name]  cholecalciferol (VITAMIN D) 1000 UNITS tablet Take 4,000 Units by mouth daily.       . Coenzyme Q10 (CO Q-10) 300 MG CAPS Take 1 capsule by mouth daily.      . Cyanocobalamin (VITAMIN B-12 IJ) Inject as directed. monthly       . finasteride (PROSCAR) 5 MG tablet Take 1 tablet by mouth 3 (three) times a week. TIW      . fluticasone (FLONASE) 50 MCG/ACT nasal spray Place 1 spray into the nose as needed.       [name] losartan (COZAAR) 25 MG tablet Take 1 tablet (25 mg total) by mouth daily.  90 tablet  3  . LUTEIN PO Take 6 mg by mouth daily.       . Melatonin 3 MG TABS Take 1 tablet by mouth Nightly.      . metoprolol tartrate (LOPRESSOR) 25 MG tablet Take 1 tablet (25 mg total) by mouth 2 (two) times daily.  180 tablet  3  . Multiple  Vitamin (MULTIVITAMIN) tablet Take 1 tablet by mouth daily.        . Multiple Vitamins-Minerals (PRESERVISION AREDS PO) Take 1 capsule by mouth 2 (two) times daily.        [name] NITROSTAT 0.4 MG SL tablet as directed.      . ranitidine (ZANTAC) 150 MG tablet Take 150 mg by mouth daily.      [name] ZETIA 10 MG tablet Take 10 mg by mouth daily.        No current facility-administered medications for this visit.    History   Social History  . Marital Status: Widowed    Spouse Name: N/A    Number of Children: 3  . Years of Education: N/A   Occupational History  . Not on file.   Social History Main Topics  . Smoking status: Never Smoker   . Smokeless tobacco: Never Used  . Alcohol Use: No  . Drug Use: No  . Sexual Activity: Not on file   Other Topics Concern  . Not on file   Social History Narrative  . No narrative on file    Socially he is widowed. He is retired Sales promotion account executive.  He has 3 children and 9 grandchildren. There's no tobacco use.  ROS is negative for fevers, chills or night sweats. He denies change in vision or hearing. He is unaware lymphadenopathy. He denies dizziness. He denies recent increase in chest pain symptoms. He denies bleeding issues but has had issues with elevation of platelet count followed by [name]. At times he notes indigestion and has a large hiatal hernia.  He denies abdominal pain, nausea vomiting or diarrhea. He is unaware of blood in stool or urine He denies bleeding. He denies significant edema. He denies paresthesias. There is no claudication. There is no history of cold heat intolerance. There is no diabetes. Other comprehensive 14 point system review is negative.  PE BP 122/70  Pulse 49  Ht 5' 6.5" (1.689 m)  Wt 133 lb 1.6 oz (60.374 kg)  BMI 21.16 kg/m2  General: Alert, oriented, no distress.  Skin: normal turgor, no rashes HEENT: Normocephalic, atraumatic. Pupils round and reactive; sclera anicteric;no lid lag.  Nose  without nasal septal hypertrophy Mouth/Parynx benign; Mallinpatti scale 2 Neck: No JVD, no carotid bruits with normal carotid Lungs: clear to ausculatation and percussion; no wheezing or rales Chest wall: No tenderness to Heart: RRR, s1 s2 normal 1-6/1 diastolic murmur compatible with his documented aortic  insufficiency; no S3 or S4 gallop. No parasternal lift, heave, or rub Abdomen: soft, nontender; no hepatosplenomehaly, BS+; abdominal aorta nontender and not dilated by palpation. Back: No CVA tenderness the Pulses 2+ Extremities: no clubbing cyanosis or edema, Homan's sign negative  Neurologic: grossly nonfocal Psychological: Normal affect and mood  ECG (in apparently read by me): Sinus bradycardia 49 beats per minute. No ectopy. Normal intervals.  Prior ECG of 11/30/2012: Sinus rhythm at 53 beats per minute. Normal intervals.  LABS:  BMET    Component Value Date/Time   NA 142 03/18/2013 1437   NA 139 10/18/2009 0330   K 4.5 03/18/2013 1437   K 4.5 10/18/2009 0330   CL 108* 09/01/2012 0950   CL 108 10/18/2009 0330   CO2 23 03/18/2013 1437   CO2 23 10/18/2009 0330   GLUCOSE 84 03/18/2013 1437   GLUCOSE 116* 09/01/2012 0950   GLUCOSE 108* 10/18/2009 0330   BUN 28.2* 03/18/2013 1437   BUN 21 10/18/2009 0330   CREATININE 1.6* 03/18/2013 1437   CREATININE 1.49 10/18/2009 0330   CALCIUM 9.7 03/18/2013 1437   CALCIUM 9.0 10/18/2009 0330   GFRNONAA 45* 10/18/2009 0330   GFRAA  Value: 54        The eGFR has been calculated using the MDRD equation. This calculation has not been validated in all clinical situations. eGFR's persistently <60 mL/min signify possible Chronic Kidney Disease.* 10/18/2009 0330     Hepatic Function Panel     Component Value Date/Time   PROT 7.3 09/01/2012 0950   ALBUMIN 3.5 09/01/2012 0950   AST 22 09/01/2012 0950   ALT 16 09/01/2012 0950   ALKPHOS 82 09/01/2012 0950   BILITOT 0.40 09/01/2012 0950     CBC    Component Value Date/Time   WBC 9.5 03/18/2013  1437   WBC 15.6* 10/18/2009 0330   RBC 4.12* 03/18/2013 1437   RBC 4.25 10/18/2009 0330   HGB 12.7* 03/18/2013 1437   HGB 13.6 10/18/2009 0330   HCT 39.3 03/18/2013 1437   HCT 39.0 10/18/2009 0330   PLT 710* 03/18/2013 1437   PLT 329 10/18/2009 0330   MCV 95.4 03/18/2013 1437   MCV 91.8 10/18/2009 0330   MCH 30.8 03/18/2013 1437   MCHC 32.3 03/18/2013 1437   MCHC 34.8 10/18/2009 0330   RDW 15.0* 03/18/2013 1437   RDW 13.1 10/18/2009 0330   LYMPHSABS 2.3 03/18/2013 1437   MONOABS 0.7 03/18/2013 1437   EOSABS 0.3 03/18/2013 1437   BASOSABS 0.3* 03/18/2013 1437     BNP No results found for this basename: probnp    Lipid Panel  No results found for this basename: chol,  trig,  hdl,  cholhdl,  vldl,  ldlcalc     RADIOLOGY: No results found.    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 He is now 19 years status post CBG revascularization surgery which was done while living in [place]. He has documented graft occlusion of 2 of his grafts and has severe native coronary artery disease. Remotely, we tried adding Ranexa to his medication but he has stopped this due to concerns of tolerability. He has renal insufficiency with creatinines typically in the 1.6. Most recent blood work by Dr. Abner Greenspan revealed his creatinine to 1.7. He's not having any significant increase in symptomatology. He has not wanted to take a statin but he is tolerating Zetia. His blood pressure is well-controlled. Clinically he is well  compensated on his current medical regimen without symptoms. As long as he remains stable, I will see him in 6 months for cardiology reevaluation.   Troy Sine, MD, [facility]  06/12/2013 9:35 AM

## 2013-06-15 DIAGNOSIS — H903 Sensorineural hearing loss, bilateral: Secondary | ICD-10-CM | POA: Diagnosis not present

## 2013-06-15 DIAGNOSIS — J309 Allergic rhinitis, unspecified: Secondary | ICD-10-CM | POA: Diagnosis not present

## 2013-06-15 DIAGNOSIS — H905 Unspecified sensorineural hearing loss: Secondary | ICD-10-CM | POA: Diagnosis not present

## 2013-06-22 DIAGNOSIS — J309 Allergic rhinitis, unspecified: Secondary | ICD-10-CM | POA: Diagnosis not present

## 2013-06-22 DIAGNOSIS — H905 Unspecified sensorineural hearing loss: Secondary | ICD-10-CM | POA: Diagnosis not present

## 2013-06-22 DIAGNOSIS — H903 Sensorineural hearing loss, bilateral: Secondary | ICD-10-CM | POA: Diagnosis not present

## 2013-06-27 DIAGNOSIS — M810 Age-related osteoporosis without current pathological fracture: Secondary | ICD-10-CM | POA: Diagnosis not present

## 2013-06-27 DIAGNOSIS — M47814 Spondylosis without myelopathy or radiculopathy, thoracic region: Secondary | ICD-10-CM | POA: Diagnosis not present

## 2013-06-27 DIAGNOSIS — IMO0002 Reserved for concepts with insufficient information to code with codable children: Secondary | ICD-10-CM | POA: Diagnosis not present

## 2013-06-27 DIAGNOSIS — Z79899 Other long term (current) drug therapy: Secondary | ICD-10-CM | POA: Diagnosis not present

## 2013-06-29 DIAGNOSIS — H905 Unspecified sensorineural hearing loss: Secondary | ICD-10-CM | POA: Diagnosis not present

## 2013-06-29 DIAGNOSIS — H903 Sensorineural hearing loss, bilateral: Secondary | ICD-10-CM | POA: Diagnosis not present

## 2013-06-29 DIAGNOSIS — J309 Allergic rhinitis, unspecified: Secondary | ICD-10-CM | POA: Diagnosis not present

## 2013-07-05 ENCOUNTER — Telehealth: Payer: Self-pay | Admitting: Critical Care Medicine

## 2013-07-06 NOTE — Telephone Encounter (Signed)
Spoke with pt and given appt with Dr Joya Gaskins on 08/01/13 at 10:15

## 2013-07-11 DIAGNOSIS — N138 Other obstructive and reflux uropathy: Secondary | ICD-10-CM | POA: Diagnosis not present

## 2013-07-11 DIAGNOSIS — N139 Obstructive and reflux uropathy, unspecified: Secondary | ICD-10-CM | POA: Diagnosis not present

## 2013-07-11 DIAGNOSIS — N401 Enlarged prostate with lower urinary tract symptoms: Secondary | ICD-10-CM | POA: Diagnosis not present

## 2013-07-11 DIAGNOSIS — N289 Disorder of kidney and ureter, unspecified: Secondary | ICD-10-CM | POA: Diagnosis not present

## 2013-08-01 ENCOUNTER — Encounter: Payer: Self-pay | Admitting: Critical Care Medicine

## 2013-08-01 ENCOUNTER — Ambulatory Visit (INDEPENDENT_AMBULATORY_CARE_PROVIDER_SITE_OTHER): Payer: Medicare Other | Admitting: Critical Care Medicine

## 2013-08-01 VITALS — BP 104/66 | HR 88 | Temp 97.0°F | Ht 66.0 in | Wt 135.0 lb

## 2013-08-01 DIAGNOSIS — J45909 Unspecified asthma, uncomplicated: Secondary | ICD-10-CM

## 2013-08-01 DIAGNOSIS — I2510 Atherosclerotic heart disease of native coronary artery without angina pectoris: Secondary | ICD-10-CM

## 2013-08-01 DIAGNOSIS — J4540 Moderate persistent asthma, uncomplicated: Secondary | ICD-10-CM

## 2013-08-01 NOTE — Progress Notes (Signed)
Subjective:    Patient ID: Alexander [name], Dr., male    DOB: May 10, 1921, 80 y.o.   MRN: [id number]  HPI     08/01/2013 Chief Complaint  Patient presents with  . Asthma    Breathing is doing well. Report SOB with severe exertion. Denies chest tightness or coughing.  No issues off pulmicort, no longer hoarse.  Still with pndrip Mucus is white in the AM.  Only dyspneic if runs up stairs.  Not exerting self to this point     Past Medical History  Diagnosis Date  . Hypertension   . Hyperlipidemia   . GERD (gastroesophageal reflux disease)   . Spinal stenosis   . Migraines     OCULAR  . BPH (benign prostatic hyperplasia)   . CKD (chronic kidney disease), stage III   . Vitamin deficiency   . Diverticulitis   . Heart murmur   . Calcium oxalate renal stones   . Renal lesion   . Dry senile macular degeneration   . Allergic rhinitis   . Anemia   . Peripheral neuropathy   . Thrombocytosis 08/18/2012    Platelets 711,000  Hb 14, WBC 8,500 08/05/12  . Hearing loss in left ear      Family History  Problem Relation Age of Onset  . Allergies Daughter   . Asthma Grandchild   . Heart failure Mother   . Coronary artery disease Maternal Grandfather      History   Social History  . Marital Status: Widowed    Spouse Name: N/A    Number of Children: 3  . Years of Education: N/A   Occupational History  . Not on file.   Social History Main Topics  . Smoking status: Never Smoker   . Smokeless tobacco: Never Used  . Alcohol Use: No  . Drug Use: No  . Sexual Activity: Not on file   Other Topics Concern  . Not on file   Social History Narrative  . No narrative on file     No Known Allergies   Outpatient Prescriptions Prior to Visit  Medication Sig Dispense Refill  . aluminum & magnesium hydroxide-simethicone (MYLANTA) 500-450-40 MG/5ML suspension Take by mouth every 6 (six) hours as needed. 200-200-20mg /3ml       . aspirin 81 MG tablet Take 81 mg by mouth daily.        .  cholecalciferol (VITAMIN D) 1000 UNITS tablet Take 2,000 Units by mouth daily.       . Coenzyme Q10 (CO Q-10) 300 MG CAPS Take 1 capsule by mouth daily.      . Cyanocobalamin (VITAMIN B-12 IJ) Inject as directed. monthly       . finasteride (PROSCAR) 5 MG tablet Take 1 tablet by mouth 3 (three) times a week. TIW      . fluticasone (FLONASE) 50 MCG/ACT nasal spray Place 1 spray into the nose as needed.       [name] losartan (COZAAR) 25 MG tablet Take 1 tablet (25 mg total) by mouth daily.  90 tablet  3  . LUTEIN PO Take 6 mg by mouth daily.       . Melatonin 3 MG TABS Take 1 tablet by mouth Nightly.      . metoprolol tartrate (LOPRESSOR) 25 MG tablet Take 1 tablet (25 mg total) by mouth 2 (two) times daily.  180 tablet  3  . Multiple Vitamin (MULTIVITAMIN) tablet Take 1 tablet by mouth daily.        [name]  Multiple Vitamins-Minerals (PRESERVISION AREDS PO) Take 1 capsule by mouth 2 (two) times daily.        [name] NITROSTAT 0.4 MG SL tablet as directed.      . ranitidine (ZANTAC) 150 MG tablet Take 150 mg by mouth daily.      [name] ZETIA 10 MG tablet Take 10 mg by mouth daily.        No facility-administered medications prior to visit.   Review of Systems  Constitutional: Negative for diaphoresis, activity change, fatigue and unexpected weight change.  HENT: Positive for congestion, hearing loss and voice change. Negative for dental problem, ear discharge, facial swelling, mouth sores, nosebleeds, sinus pressure and tinnitus.        No dysphagia No sore throat Is hoarse  Eyes: Positive for itching. Negative for photophobia, discharge and visual disturbance.  Respiratory: Negative for apnea, choking, chest tightness and stridor.   Cardiovascular: Negative for palpitations.  Gastrointestinal: Negative for nausea, constipation, blood in stool and abdominal distention.  Genitourinary: Negative for dysuria, urgency, frequency, hematuria, flank pain, decreased urine volume and difficulty urinating.   Musculoskeletal: Positive for gait problem. Negative for arthralgias, back pain, joint swelling and neck stiffness.       Balance issue   Skin: Negative for color change and pallor.  Neurological: Positive for weakness and numbness. Negative for dizziness, tremors, seizures, syncope, speech difficulty and light-headedness.  Hematological: Negative for adenopathy. Does not bruise/bleed easily.  Psychiatric/Behavioral: Negative for confusion, sleep disturbance and agitation. The patient is not nervous/anxious.        Objective:   Physical Exam  Filed Vitals:   08/01/13 1006 08/01/13 1007  BP:  104/66  Pulse:  88  Temp: 97 F (36.1 C)   TempSrc: Oral   Height: 5\' 6"  (1.676 m)   Weight: 135 lb (61.236 kg)   SpO2:  93%    Gen: Pleasant, well-nourished, in no distress,  normal affect  ENT: No lesions,  mouth clear,  oropharynx clear, no postnasal drip  Neck: No JVD, no TMG, no carotid bruits  Lungs: No use of accessory muscles, no dullness to percussion, improved airflow  Cardiovascular: RRR, heart sounds normal, no murmur or gallops, no peripheral edema  Abdomen: soft and NT, no HSM,  BS normal  Musculoskeletal: No deformities, no cyanosis or clubbing  Neuro: alert, non focal  Skin: Warm, no lesions or rashes      Assessment & Plan:   Moderate persistent asthma without complication Moderate persistent asthma with stable lower airway inflammation Plan Okay to stay off inhaled steroids at this time     Updated Medication List Outpatient Encounter Prescriptions as of 08/01/2013  Medication Sig  . aluminum & magnesium hydroxide-simethicone (MYLANTA) 500-450-40 MG/5ML suspension Take by mouth every 6 (six) hours as needed. 200-200-20mg /81ml   . aspirin 81 MG tablet Take 81 mg by mouth daily.    . cholecalciferol (VITAMIN D) 1000 UNITS tablet Take 2,000 Units by mouth daily.   . Coenzyme Q10 (CO Q-10) 300 MG CAPS Take 1 capsule by mouth daily.  . Cyanocobalamin  (VITAMIN B-12 IJ) Inject as directed. monthly   . finasteride (PROSCAR) 5 MG tablet Take 1 tablet by mouth 3 (three) times a week. TIW  . fluticasone (FLONASE) 50 MCG/ACT nasal spray Place 1 spray into the nose as needed.   [name] losartan (COZAAR) 25 MG tablet Take 1 tablet (25 mg total) by mouth daily.  . LUTEIN PO Take 6 mg by mouth daily.   . Melatonin  3 MG TABS Take 1 tablet by mouth Nightly.  . metoprolol tartrate (LOPRESSOR) 25 MG tablet Take 1 tablet (25 mg total) by mouth 2 (two) times daily.  . Multiple Vitamin (MULTIVITAMIN) tablet Take 1 tablet by mouth daily.    . Multiple Vitamins-Minerals (PRESERVISION AREDS PO) Take 1 capsule by mouth 2 (two) times daily.    [name] NITROSTAT 0.4 MG SL tablet as directed.  . ranitidine (ZANTAC) 150 MG tablet Take 150 mg by mouth daily.  [name] ZETIA 10 MG tablet Take 10 mg by mouth daily.

## 2013-08-01 NOTE — Assessment & Plan Note (Signed)
Moderate persistent asthma with stable lower airway inflammation Plan Okay to stay off inhaled steroids at this time

## 2013-08-01 NOTE — Patient Instructions (Signed)
Stay off pulmicort Return 1 year or sooner as needed

## 2013-08-03 DIAGNOSIS — IMO0002 Reserved for concepts with insufficient information to code with codable children: Secondary | ICD-10-CM | POA: Diagnosis not present

## 2013-08-03 DIAGNOSIS — M810 Age-related osteoporosis without current pathological fracture: Secondary | ICD-10-CM | POA: Diagnosis not present

## 2013-08-04 DIAGNOSIS — L570 Actinic keratosis: Secondary | ICD-10-CM | POA: Diagnosis not present

## 2013-08-04 DIAGNOSIS — L738 Other specified follicular disorders: Secondary | ICD-10-CM | POA: Diagnosis not present

## 2013-08-04 DIAGNOSIS — L821 Other seborrheic keratosis: Secondary | ICD-10-CM | POA: Diagnosis not present

## 2013-08-10 DIAGNOSIS — H905 Unspecified sensorineural hearing loss: Secondary | ICD-10-CM | POA: Diagnosis not present

## 2013-08-10 DIAGNOSIS — H903 Sensorineural hearing loss, bilateral: Secondary | ICD-10-CM | POA: Diagnosis not present

## 2013-08-10 DIAGNOSIS — J309 Allergic rhinitis, unspecified: Secondary | ICD-10-CM | POA: Diagnosis not present

## 2013-09-06 ENCOUNTER — Encounter: Payer: Self-pay | Admitting: Oncology

## 2013-09-06 ENCOUNTER — Ambulatory Visit (INDEPENDENT_AMBULATORY_CARE_PROVIDER_SITE_OTHER): Payer: Medicare Other | Admitting: Oncology

## 2013-09-06 VITALS — BP 127/64 | HR 53 | Temp 96.4°F | Ht 66.5 in | Wt 133.7 lb

## 2013-09-06 DIAGNOSIS — D473 Essential (hemorrhagic) thrombocythemia: Secondary | ICD-10-CM | POA: Diagnosis not present

## 2013-09-06 DIAGNOSIS — D75839 Thrombocytosis, unspecified: Secondary | ICD-10-CM

## 2013-09-06 LAB — CBC WITH DIFFERENTIAL/PLATELET
BASOS ABS: 0.4 10*3/uL — AB (ref 0.0–0.1)
Basophils Relative: 4 % — ABNORMAL HIGH (ref 0–1)
Eosinophils Absolute: 0.4 10*3/uL (ref 0.0–0.7)
Eosinophils Relative: 4 % (ref 0–5)
HCT: 37.2 % — ABNORMAL LOW (ref 39.0–52.0)
Hemoglobin: 12.4 g/dL — ABNORMAL LOW (ref 13.0–17.0)
LYMPHS ABS: 1.6 10*3/uL (ref 0.7–4.0)
LYMPHS PCT: 18 % (ref 12–46)
MCH: 31.4 pg (ref 26.0–34.0)
MCHC: 33.3 g/dL (ref 30.0–36.0)
MCV: 94.2 fL (ref 78.0–100.0)
Monocytes Absolute: 0.7 10*3/uL (ref 0.1–1.0)
Monocytes Relative: 8 % (ref 3–12)
NEUTROS ABS: 5.9 10*3/uL (ref 1.7–7.7)
Neutrophils Relative %: 66 % (ref 43–77)
Platelets: 813 10*3/uL — ABNORMAL HIGH (ref 150–400)
RBC: 3.95 MIL/uL — AB (ref 4.22–5.81)
RDW: 14.8 % (ref 11.5–15.5)
WBC: 8.9 10*3/uL (ref 4.0–10.5)

## 2013-09-06 LAB — COMPREHENSIVE METABOLIC PANEL
ALBUMIN: 3.8 g/dL (ref 3.5–5.2)
ALK PHOS: 60 U/L (ref 39–117)
ALT: 13 U/L (ref 0–53)
AST: 20 U/L (ref 0–37)
BUN: 31 mg/dL — ABNORMAL HIGH (ref 6–23)
CHLORIDE: 109 meq/L (ref 96–112)
CO2: 24 mEq/L (ref 19–32)
Calcium: 9.4 mg/dL (ref 8.4–10.5)
Creat: 1.62 mg/dL — ABNORMAL HIGH (ref 0.50–1.35)
Glucose, Bld: 87 mg/dL (ref 70–99)
POTASSIUM: 5.2 meq/L (ref 3.5–5.3)
SODIUM: 139 meq/L (ref 135–145)
TOTAL PROTEIN: 6.4 g/dL (ref 6.0–8.3)
Total Bilirubin: 0.5 mg/dL (ref 0.2–1.2)

## 2013-09-06 LAB — LACTATE DEHYDROGENASE: LDH: 147 U/L (ref 94–250)

## 2013-09-06 NOTE — Patient Instructions (Signed)
To lab today Continue aspirin prophylaxis We will check platelet count again in 4 months  Visit with Dr Darnell Level  1 week after lab in 4 months

## 2013-09-06 NOTE — Progress Notes (Signed)
Patient ID: Alexander [name], Dr., male   DOB: 04/27/1922, 80 y.o.   MRN: [id number] Hematology and Oncology Follow Up Visit  Alexander [name], Dr. [id number] [birth year] 80 y.o. 09/06/2013 2:22 PM   Principle Diagnosis: Encounter Diagnosis  Name Primary?  . 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 I initially evaluated him one year ago in May of 2014 for an elevated platelet count. He had no adenopathy organomegaly on exam. JAK-2 gene mutation announces was done and the gene was mutated diagnostic for a myeloproliferative disorder-essential thrombocythemia. He had no history of any prior thrombotic events. No neurologic symptoms. I advised aspirin alone for thromboembolic prophylaxis and I did not feel that he needed to be on a platelet lowering drug. Platelet count on 09/01/2012 was 703,000. A repeat count on 03/18/2013 was stable at 710,000. Today's labs are pending. He continues to do well. He has had no significant interim medical problems. He noted decreased hearing in his left ear. He was evaluated by Dr. Hermina Barters. He had a minor surgical procedure and instillation of steroids but it did not help the problem. He has had no interim infections. No bleeding.   Medications: reviewed  Allergies: No Known Allergies  Review of Systems: Hematology: See above  ENT ROS: See above Breast ROS:  Respiratory ROS: No cough or dyspnea Cardiovascular ROS: No chest pain or palpitations  Gastrointestinal ROS:  No abdominal pain or change in bowel habit  Genito-Urinary ROS: Not questioned no muscle bone or joint pain  Musculoskeletal ROS:  Neurological ROS: No headache or change in vision. Persistent but mild no distal paresthesias Dermatological ROS: No rash or ecchymosis  Remaining ROS negative:   Physical Exam: Blood pressure 127/64, pulse 53, temperature 96.4 F (35.8 C),  temperature source Oral, height 5' 6.5" (1.689 m), weight 133 lb 11.2 oz (60.646 kg), SpO2 9.00%. Wt Readings from Last 3 Encounters:  09/06/13 133 lb 11.2 oz (60.646 kg)  08/01/13 135 lb (61.236 kg)  06/08/13 133 lb 1.6 oz (60.374 kg)     General appearance: Thin Caucasian man HENNT: Pharynx no erythema, exudate, mass, or ulcer. No thyromegaly or thyroid nodules Lymph nodes: No cervical, supraclavicular, or axillary lymphadenopathy Breasts: Lungs: Clear to auscultation, resonant to percussion throughout Heart: Regular rhythm, [id number] aortic systolic murmur murmur, no gallop, no rub, no click, no edema Abdomen: Soft, nontender, normal bowel sounds, no mass, no organomegaly Extremities: No edema, no calf tenderness Musculoskeletal: no joint deformities GU:  Vascular: Carotid pulses 2+, no bruits, distal pulses: Dorsalis pedis 1+ symmetric Neurologic: Alert, oriented, PERRLA,  nerves grossly normal, motor strength 5 over 5, reflexes 1+ symmetric, upper body coordination normal, gait normal, Skin: No rash or ecchymosis  Lab Results: CBC W/Diff    Component Value Date/Time   WBC 9.5 03/18/2013 1437   WBC 15.6* 10/18/2009 0330   RBC 4.12* 03/18/2013 1437   RBC 4.25 10/18/2009 0330   HGB 12.7* 03/18/2013 1437   HGB 13.6 10/18/2009 0330   HCT 39.3 03/18/2013 1437   HCT 39.0 10/18/2009 0330   PLT 710* 03/18/2013 1437   PLT 329 10/18/2009 0330   MCV 95.4 03/18/2013 1437   MCV 91.8 10/18/2009 0330   MCH 30.8 03/18/2013 1437   MCHC 32.3 03/18/2013 1437   MCHC 34.8 10/18/2009 0330   RDW 15.0* 03/18/2013 1437   RDW 13.1 10/18/2009 0330   LYMPHSABS 2.3 03/18/2013  1437   MONOABS 0.7 03/18/2013 1437   EOSABS 0.3 03/18/2013 1437   BASOSABS 0.3* 03/18/2013 1437     Chemistry      Component Value Date/Time   NA 142 03/18/2013 1437   NA 139 10/18/2009 0330   K 4.5 03/18/2013 1437   K 4.5 10/18/2009 0330   CL 108* 09/01/2012 0950   CL 108 10/18/2009 0330   CO2 23 03/18/2013 1437   CO2 23  10/18/2009 0330   BUN 28.2* 03/18/2013 1437   BUN 21 10/18/2009 0330   CREATININE 1.6* 03/18/2013 1437   CREATININE 1.49 10/18/2009 0330      Component Value Date/Time   CALCIUM 9.7 03/18/2013 1437   CALCIUM 9.0 10/18/2009 0330   ALKPHOS 82 09/01/2012 0950   AST 22 09/01/2012 0950   ALT 16 09/01/2012 0950   BILITOT 0.40 09/01/2012 0950      Impression:  #1. Essential thrombocythemia Continue aspirin 81 mg daily for thrombo prophylaxis.   CC: Patient Care Team: Jerlyn Ly, MD as PCP - General (Internal Medicine) Elsie Stain, MD as Attending Physician (Pulmonary Disease) Elsie Stain, MD as Consulting Physician (Pulmonary Disease) Troy Sine, MD as Consulting Physician (Cardiology) Annia Belt, MD as Consulting Physician (Hematology and Oncology) Mayme Genta, MD as Consulting Physician (Gastroenterology)   Annia Belt, MD 5/5/20152:22 PM

## 2013-09-16 DIAGNOSIS — H353290 Exudative age-related macular degeneration, unspecified eye, stage unspecified: Secondary | ICD-10-CM | POA: Diagnosis not present

## 2013-09-19 DIAGNOSIS — H903 Sensorineural hearing loss, bilateral: Secondary | ICD-10-CM | POA: Diagnosis not present

## 2013-09-19 DIAGNOSIS — H905 Unspecified sensorineural hearing loss: Secondary | ICD-10-CM | POA: Diagnosis not present

## 2013-09-19 DIAGNOSIS — J309 Allergic rhinitis, unspecified: Secondary | ICD-10-CM | POA: Diagnosis not present

## 2013-09-28 ENCOUNTER — Telehealth: Payer: Self-pay | Admitting: *Deleted

## 2013-09-28 NOTE — Telephone Encounter (Signed)
Call from pt - wants Dr [name] to call him to discuss his latest test results. Text message sent to [name].

## 2013-10-10 ENCOUNTER — Encounter: Payer: Self-pay | Admitting: Oncology

## 2013-10-10 ENCOUNTER — Other Ambulatory Visit: Payer: Self-pay | Admitting: Oncology

## 2013-10-10 DIAGNOSIS — N189 Chronic kidney disease, unspecified: Secondary | ICD-10-CM | POA: Insufficient documentation

## 2013-10-10 DIAGNOSIS — N183 Chronic kidney disease, stage 3 unspecified: Secondary | ICD-10-CM

## 2013-10-10 DIAGNOSIS — D649 Anemia, unspecified: Secondary | ICD-10-CM

## 2013-10-10 DIAGNOSIS — D473 Essential (hemorrhagic) thrombocythemia: Secondary | ICD-10-CM | POA: Insufficient documentation

## 2013-10-10 DIAGNOSIS — D631 Anemia in chronic kidney disease: Secondary | ICD-10-CM

## 2013-10-10 HISTORY — DX: Essential (hemorrhagic) thrombocythemia: D47.3

## 2013-10-10 HISTORY — DX: Anemia, unspecified: D64.9

## 2013-10-10 HISTORY — DX: Anemia in chronic kidney disease: N18.9

## 2013-10-10 HISTORY — DX: Anemia in chronic kidney disease: D63.1

## 2013-10-11 ENCOUNTER — Other Ambulatory Visit (INDEPENDENT_AMBULATORY_CARE_PROVIDER_SITE_OTHER): Payer: Medicare Other

## 2013-10-11 DIAGNOSIS — D473 Essential (hemorrhagic) thrombocythemia: Secondary | ICD-10-CM

## 2013-10-11 DIAGNOSIS — D631 Anemia in chronic kidney disease: Secondary | ICD-10-CM | POA: Diagnosis not present

## 2013-10-11 DIAGNOSIS — N183 Chronic kidney disease, stage 3 unspecified: Secondary | ICD-10-CM | POA: Diagnosis not present

## 2013-10-11 DIAGNOSIS — D62 Acute posthemorrhagic anemia: Secondary | ICD-10-CM

## 2013-10-11 DIAGNOSIS — N189 Chronic kidney disease, unspecified: Secondary | ICD-10-CM

## 2013-10-11 DIAGNOSIS — D649 Anemia, unspecified: Secondary | ICD-10-CM

## 2013-10-11 LAB — CBC WITH DIFFERENTIAL/PLATELET
Basophils Absolute: 0.3 10*3/uL — ABNORMAL HIGH (ref 0.0–0.1)
Basophils Relative: 3 % — ABNORMAL HIGH (ref 0–1)
Eosinophils Absolute: 0.6 10*3/uL (ref 0.0–0.7)
Eosinophils Relative: 6 % — ABNORMAL HIGH (ref 0–5)
HCT: 36 % — ABNORMAL LOW (ref 39.0–52.0)
Hemoglobin: 12 g/dL — ABNORMAL LOW (ref 13.0–17.0)
Lymphocytes Relative: 27 % (ref 12–46)
Lymphs Abs: 2.7 10*3/uL (ref 0.7–4.0)
MCH: 30.8 pg (ref 26.0–34.0)
MCHC: 33.3 g/dL (ref 30.0–36.0)
MCV: 92.5 fL (ref 78.0–100.0)
Monocytes Absolute: 0.7 10*3/uL (ref 0.1–1.0)
Monocytes Relative: 7 % (ref 3–12)
Neutro Abs: 5.7 10*3/uL (ref 1.7–7.7)
Neutrophils Relative %: 57 % (ref 43–77)
Platelets: 815 10*3/uL — ABNORMAL HIGH (ref 150–400)
RBC: 3.89 MIL/uL — ABNORMAL LOW (ref 4.22–5.81)
RDW: 15.3 % (ref 11.5–15.5)
WBC: 10 10*3/uL (ref 4.0–10.5)

## 2013-10-12 ENCOUNTER — Other Ambulatory Visit: Payer: Self-pay | Admitting: Oncology

## 2013-10-12 DIAGNOSIS — E875 Hyperkalemia: Secondary | ICD-10-CM

## 2013-10-12 DIAGNOSIS — D649 Anemia, unspecified: Secondary | ICD-10-CM | POA: Diagnosis not present

## 2013-10-12 DIAGNOSIS — N189 Chronic kidney disease, unspecified: Secondary | ICD-10-CM | POA: Diagnosis not present

## 2013-10-12 DIAGNOSIS — D473 Essential (hemorrhagic) thrombocythemia: Secondary | ICD-10-CM

## 2013-10-12 DIAGNOSIS — N183 Chronic kidney disease, stage 3 unspecified: Secondary | ICD-10-CM | POA: Diagnosis not present

## 2013-10-12 DIAGNOSIS — D631 Anemia in chronic kidney disease: Secondary | ICD-10-CM | POA: Diagnosis not present

## 2013-10-12 LAB — BASIC METABOLIC PANEL
BUN: 32 mg/dL — ABNORMAL HIGH (ref 6–23)
CALCIUM: 9.4 mg/dL (ref 8.4–10.5)
CO2: 25 mEq/L (ref 19–32)
Chloride: 105 mEq/L (ref 96–112)
Creat: 1.82 mg/dL — ABNORMAL HIGH (ref 0.50–1.35)
GLUCOSE: 98 mg/dL (ref 70–99)
Potassium: 5.4 mEq/L — ABNORMAL HIGH (ref 3.5–5.3)
Sodium: 138 mEq/L (ref 135–145)

## 2013-10-13 ENCOUNTER — Telehealth: Payer: Self-pay | Admitting: *Deleted

## 2013-10-13 ENCOUNTER — Other Ambulatory Visit (INDEPENDENT_AMBULATORY_CARE_PROVIDER_SITE_OTHER): Payer: Medicare Other

## 2013-10-13 DIAGNOSIS — N183 Chronic kidney disease, stage 3 unspecified: Secondary | ICD-10-CM

## 2013-10-13 DIAGNOSIS — D649 Anemia, unspecified: Secondary | ICD-10-CM

## 2013-10-13 DIAGNOSIS — D631 Anemia in chronic kidney disease: Secondary | ICD-10-CM

## 2013-10-13 DIAGNOSIS — N189 Chronic kidney disease, unspecified: Secondary | ICD-10-CM

## 2013-10-13 DIAGNOSIS — E875 Hyperkalemia: Secondary | ICD-10-CM

## 2013-10-13 DIAGNOSIS — D473 Essential (hemorrhagic) thrombocythemia: Secondary | ICD-10-CM

## 2013-10-13 LAB — MULTIPLE MYELOMA PANEL, SERUM
ALPHA-2-GLOBULIN: 10.9 % (ref 7.1–11.8)
Albumin ELP: 57.8 % (ref 55.8–66.1)
Alpha-1-Globulin: 6.9 % — ABNORMAL HIGH (ref 2.9–4.9)
Beta 2: 5.3 % (ref 3.2–6.5)
Beta Globulin: 6 % (ref 4.7–7.2)
Gamma Globulin: 13.1 % (ref 11.1–18.8)
IGA: 324 mg/dL (ref 68–379)
IGG (IMMUNOGLOBIN G), SERUM: 898 mg/dL (ref 650–1600)
IgM, Serum: 67 mg/dL (ref 41–251)
TOTAL PROTEIN: 6.5 g/dL (ref 6.0–8.3)

## 2013-10-13 LAB — BASIC METABOLIC PANEL WITH GFR
BUN: 35 mg/dL — AB (ref 6–23)
CO2: 24 mEq/L (ref 19–32)
Calcium: 9.3 mg/dL (ref 8.4–10.5)
Chloride: 106 mEq/L (ref 96–112)
Creat: 1.79 mg/dL — ABNORMAL HIGH (ref 0.50–1.35)
GFR, Est African American: 38 mL/min — ABNORMAL LOW
GFR, Est Non African American: 33 mL/min — ABNORMAL LOW
GLUCOSE: 123 mg/dL — AB (ref 70–99)
POTASSIUM: 4.6 meq/L (ref 3.5–5.3)
Sodium: 142 mEq/L (ref 135–145)

## 2013-10-13 NOTE — Telephone Encounter (Signed)
Pt notified that repeat potassium drawn with heparin tube was normal at 4.6, per [name].  Pt expressed relief that he "could now keep eating the foods he liked." Pt also asked when results from his 24 hr urine might be ready and lab noted preliminary results might be back tomorrow but [name] will be out til Monday, when final results more likely to be available, at which time we would get back to him with the results. Yvonna Alanis, RN, 10/13/13, 3:16 PM

## 2013-10-14 LAB — CREATININE CLEARANCE, URINE, 24 HOUR
Creatinine Clearance: 35 mL/min — ABNORMAL LOW (ref 75–125)
Creatinine, 24H Ur: 926 mg/d (ref 800–2000)
Creatinine, Urine: 57.9 mg/dL
Creatinine: 1.82 mg/dL — ABNORMAL HIGH (ref 0.50–1.35)

## 2013-10-17 LAB — UPEP/TP, 24-HR URINE

## 2013-10-17 NOTE — Progress Notes (Signed)
Permission was given by [name] to provide patient with paper copy of lab results. Copies for lab results collected by the [facility],  forJune 9, 2015 - October 13, 2013 were mailed on October 17, 2013.  Alexander [name], PBT [facility]

## 2013-10-19 DIAGNOSIS — I10 Essential (primary) hypertension: Secondary | ICD-10-CM | POA: Diagnosis not present

## 2013-10-19 DIAGNOSIS — D473 Essential (hemorrhagic) thrombocythemia: Secondary | ICD-10-CM | POA: Diagnosis not present

## 2013-10-19 DIAGNOSIS — N183 Chronic kidney disease, stage 3 unspecified: Secondary | ICD-10-CM | POA: Diagnosis not present

## 2013-10-21 DIAGNOSIS — H35729 Serous detachment of retinal pigment epithelium, unspecified eye: Secondary | ICD-10-CM | POA: Diagnosis not present

## 2013-10-21 DIAGNOSIS — H353190 Nonexudative age-related macular degeneration, unspecified eye, stage unspecified: Secondary | ICD-10-CM | POA: Diagnosis not present

## 2013-10-21 DIAGNOSIS — H35369 Drusen (degenerative) of macula, unspecified eye: Secondary | ICD-10-CM | POA: Diagnosis not present

## 2013-10-21 DIAGNOSIS — H2510 Age-related nuclear cataract, unspecified eye: Secondary | ICD-10-CM | POA: Diagnosis not present

## 2013-10-25 ENCOUNTER — Telehealth: Payer: Self-pay | Admitting: *Deleted

## 2013-10-25 NOTE — Telephone Encounter (Signed)
I need to speak to someone about your services.  Please have a nurse that's knowledgeable of your services call me.  I'll be available between 2pm-5pm .  Many Thanks!  I returned his call.  He stated he has difficulty trimming his nails.  He asked if we have anyone here that specializes in it.  I told him all the doctors trim nails.  He asked which one I would recommend.  I told him they are all good.  He asked if there was one in particular I would use for myself.  I told him no.  He asked how he would proceed.  I told him I could transfer him to a scheduler for an appointment.  He stated he would appreciate that.

## 2013-10-26 ENCOUNTER — Ambulatory Visit (INDEPENDENT_AMBULATORY_CARE_PROVIDER_SITE_OTHER): Payer: Medicare Other | Admitting: Podiatry

## 2013-10-26 ENCOUNTER — Encounter: Payer: Self-pay | Admitting: Podiatry

## 2013-10-26 VITALS — BP 115/74 | HR 75 | Resp 16

## 2013-10-26 DIAGNOSIS — M79673 Pain in unspecified foot: Secondary | ICD-10-CM

## 2013-10-26 DIAGNOSIS — B351 Tinea unguium: Secondary | ICD-10-CM

## 2013-10-26 DIAGNOSIS — M79609 Pain in unspecified limb: Secondary | ICD-10-CM | POA: Diagnosis not present

## 2013-10-26 NOTE — Progress Notes (Signed)
   Subjective:    Patient ID: Alexander [name], Dr., male    DOB: Jan 19, 1922, 80 y.o.   MRN: [id number]  HPI  Debridement of toes, right 5th toe red and painful at times  Review of Systems  Constitutional: Positive for unexpected weight change.  HENT: Positive for hearing loss and sneezing.   Respiratory: Positive for shortness of breath and wheezing.   Endocrine: Positive for cold intolerance.  Allergic/Immunologic: Positive for environmental allergies.  All other systems reviewed and are negative.      Objective:   Physical Exam        Assessment & Plan:

## 2013-10-27 NOTE — Progress Notes (Signed)
Subjective:     Patient ID: Alexander [name], Dr., male   DOB: [birth year], 80 y.o.   MRN: [id number]  HPI patient presents with nail disease 1-5 both feet with discomfort when pressed and yellow type debris   Review of Systems     Objective:   Physical Exam Neurovascular status and change with thick brittle nailbeds 1-5 both feet that are painful when pressed    Assessment:     Mycotic nail infection with pain 1-5 both feet    Plan:     Debris painful nailbeds 1-5 both feet with no iatrogenic bleeding noted

## 2013-11-08 DIAGNOSIS — N281 Cyst of kidney, acquired: Secondary | ICD-10-CM | POA: Diagnosis not present

## 2013-11-08 DIAGNOSIS — D473 Essential (hemorrhagic) thrombocythemia: Secondary | ICD-10-CM | POA: Diagnosis not present

## 2013-11-08 DIAGNOSIS — D759 Disease of blood and blood-forming organs, unspecified: Secondary | ICD-10-CM | POA: Diagnosis not present

## 2013-11-29 DIAGNOSIS — M48061 Spinal stenosis, lumbar region without neurogenic claudication: Secondary | ICD-10-CM | POA: Diagnosis not present

## 2013-11-29 DIAGNOSIS — IMO0002 Reserved for concepts with insufficient information to code with codable children: Secondary | ICD-10-CM | POA: Diagnosis not present

## 2013-11-29 DIAGNOSIS — R7301 Impaired fasting glucose: Secondary | ICD-10-CM | POA: Diagnosis not present

## 2013-12-05 DIAGNOSIS — R040 Epistaxis: Secondary | ICD-10-CM | POA: Diagnosis not present

## 2013-12-12 DIAGNOSIS — L723 Sebaceous cyst: Secondary | ICD-10-CM | POA: Diagnosis not present

## 2013-12-12 DIAGNOSIS — L821 Other seborrheic keratosis: Secondary | ICD-10-CM | POA: Diagnosis not present

## 2013-12-12 DIAGNOSIS — L905 Scar conditions and fibrosis of skin: Secondary | ICD-10-CM | POA: Diagnosis not present

## 2013-12-12 DIAGNOSIS — L820 Inflamed seborrheic keratosis: Secondary | ICD-10-CM | POA: Diagnosis not present

## 2013-12-14 ENCOUNTER — Other Ambulatory Visit: Payer: Self-pay | Admitting: Cardiovascular Disease

## 2013-12-14 NOTE — Telephone Encounter (Signed)
Rx was sent to pharmacy electronically. 

## 2013-12-19 DIAGNOSIS — R040 Epistaxis: Secondary | ICD-10-CM | POA: Diagnosis not present

## 2013-12-27 DIAGNOSIS — H35729 Serous detachment of retinal pigment epithelium, unspecified eye: Secondary | ICD-10-CM | POA: Diagnosis not present

## 2013-12-27 DIAGNOSIS — H353290 Exudative age-related macular degeneration, unspecified eye, stage unspecified: Secondary | ICD-10-CM | POA: Diagnosis not present

## 2013-12-27 DIAGNOSIS — H353190 Nonexudative age-related macular degeneration, unspecified eye, stage unspecified: Secondary | ICD-10-CM | POA: Diagnosis not present

## 2013-12-27 DIAGNOSIS — H2510 Age-related nuclear cataract, unspecified eye: Secondary | ICD-10-CM | POA: Diagnosis not present

## 2014-01-11 DIAGNOSIS — H2510 Age-related nuclear cataract, unspecified eye: Secondary | ICD-10-CM | POA: Diagnosis not present

## 2014-01-11 DIAGNOSIS — H353190 Nonexudative age-related macular degeneration, unspecified eye, stage unspecified: Secondary | ICD-10-CM | POA: Diagnosis not present

## 2014-01-16 ENCOUNTER — Other Ambulatory Visit (INDEPENDENT_AMBULATORY_CARE_PROVIDER_SITE_OTHER): Payer: Medicare Other

## 2014-01-16 DIAGNOSIS — N183 Chronic kidney disease, stage 3 unspecified: Secondary | ICD-10-CM

## 2014-01-16 DIAGNOSIS — D473 Essential (hemorrhagic) thrombocythemia: Secondary | ICD-10-CM

## 2014-01-16 DIAGNOSIS — D75839 Thrombocytosis, unspecified: Secondary | ICD-10-CM

## 2014-01-16 LAB — CBC WITH DIFFERENTIAL/PLATELET
Basophils Absolute: 0.4 10*3/uL — ABNORMAL HIGH (ref 0.0–0.1)
Basophils Relative: 4 % — ABNORMAL HIGH (ref 0–1)
EOS ABS: 0.7 10*3/uL (ref 0.0–0.7)
Eosinophils Relative: 8 % — ABNORMAL HIGH (ref 0–5)
HEMATOCRIT: 39.2 % (ref 39.0–52.0)
HEMOGLOBIN: 12.8 g/dL — AB (ref 13.0–17.0)
Lymphocytes Relative: 29 % (ref 12–46)
Lymphs Abs: 2.6 10*3/uL (ref 0.7–4.0)
MCH: 31.2 pg (ref 26.0–34.0)
MCHC: 32.7 g/dL (ref 30.0–36.0)
MCV: 95.6 fL (ref 78.0–100.0)
MONO ABS: 0.6 10*3/uL (ref 0.1–1.0)
MONOS PCT: 7 % (ref 3–12)
Neutro Abs: 4.7 10*3/uL (ref 1.7–7.7)
Neutrophils Relative %: 52 % (ref 43–77)
Platelets: 841 10*3/uL — ABNORMAL HIGH (ref 150–400)
RBC: 4.1 MIL/uL — ABNORMAL LOW (ref 4.22–5.81)
RDW: 15.1 % (ref 11.5–15.5)
WBC: 9 10*3/uL (ref 4.0–10.5)

## 2014-01-16 LAB — BASIC METABOLIC PANEL WITH GFR
BUN: 26 mg/dL — AB (ref 6–23)
CALCIUM: 9.1 mg/dL (ref 8.4–10.5)
CO2: 23 mEq/L (ref 19–32)
Chloride: 105 mEq/L (ref 96–112)
Creat: 1.7 mg/dL — ABNORMAL HIGH (ref 0.50–1.35)
GFR, Est African American: 40 mL/min — ABNORMAL LOW
GFR, Est Non African American: 35 mL/min — ABNORMAL LOW
GLUCOSE: 96 mg/dL (ref 70–99)
POTASSIUM: 4.5 meq/L (ref 3.5–5.3)
Sodium: 141 mEq/L (ref 135–145)

## 2014-01-16 NOTE — Addendum Note (Signed)
Addended by: Truddie Crumble on: 01/16/2014 11:02 AM   Modules accepted: Orders

## 2014-01-18 ENCOUNTER — Ambulatory Visit (INDEPENDENT_AMBULATORY_CARE_PROVIDER_SITE_OTHER): Payer: Medicare Other | Admitting: Cardiovascular Disease

## 2014-01-18 VITALS — BP 111/62 | HR 52 | Ht 66.0 in | Wt 131.0 lb

## 2014-01-18 DIAGNOSIS — D473 Essential (hemorrhagic) thrombocythemia: Secondary | ICD-10-CM

## 2014-01-18 DIAGNOSIS — I359 Nonrheumatic aortic valve disorder, unspecified: Secondary | ICD-10-CM | POA: Diagnosis not present

## 2014-01-18 DIAGNOSIS — I351 Nonrheumatic aortic (valve) insufficiency: Secondary | ICD-10-CM

## 2014-01-18 DIAGNOSIS — E785 Hyperlipidemia, unspecified: Secondary | ICD-10-CM | POA: Diagnosis not present

## 2014-01-18 DIAGNOSIS — E782 Mixed hyperlipidemia: Secondary | ICD-10-CM

## 2014-01-18 DIAGNOSIS — D689 Coagulation defect, unspecified: Secondary | ICD-10-CM | POA: Diagnosis not present

## 2014-01-18 DIAGNOSIS — I25810 Atherosclerosis of coronary artery bypass graft(s) without angina pectoris: Secondary | ICD-10-CM | POA: Diagnosis not present

## 2014-01-18 DIAGNOSIS — I2510 Atherosclerotic heart disease of native coronary artery without angina pectoris: Secondary | ICD-10-CM

## 2014-01-18 DIAGNOSIS — N183 Chronic kidney disease, stage 3 unspecified: Secondary | ICD-10-CM

## 2014-01-18 DIAGNOSIS — I10 Essential (primary) hypertension: Secondary | ICD-10-CM | POA: Diagnosis not present

## 2014-01-18 DIAGNOSIS — Z79899 Other long term (current) drug therapy: Secondary | ICD-10-CM

## 2014-01-18 DIAGNOSIS — D75839 Thrombocytosis, unspecified: Secondary | ICD-10-CM

## 2014-01-18 DIAGNOSIS — R5381 Other malaise: Secondary | ICD-10-CM

## 2014-01-18 DIAGNOSIS — R5383 Other fatigue: Secondary | ICD-10-CM

## 2014-01-18 MED ORDER — ROSUVASTATIN CALCIUM 10 MG PO TABS: 10.0000 mg | ORAL_TABLET | Freq: Every day | ORAL | Status: DC

## 2014-01-18 MED ORDER — TRIAZOLAM 0.125 MG PO TABS: 0.1250 mg | ORAL_TABLET | Freq: Every evening | ORAL | Status: DC | PRN

## 2014-01-18 NOTE — Patient Instructions (Signed)
Crestor 10 mg  - per Dr Claiborne Billings-  TAKE 1/2 TABLET EVERY OTHER DAY.  LABS IN 4 MONTH cmp , cbc, lipid ,tsh-WILL MAIL LABSLIP  Your physician wants you to follow-up in 4 months DR Claiborne Billings You will receive a reminder letter in the mail two months in advance. If you don't receive a letter, please call our office to schedule the follow-up appointment.

## 2014-01-19 ENCOUNTER — Encounter: Payer: Self-pay | Admitting: Cardiovascular Disease

## 2014-01-19 NOTE — Progress Notes (Signed)
Patient ID: Alexander [name], Dr., male   DOB: 1921-11-28, 80 y.o.   MRN: [id number]      HPI: [name] is a 80 y.o. male who presents to the office for a 7 month followup cardiologic evaluation.  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  In 1996 while living in [place] he underwent CABG surgery and had a LIMA placed to the LAD, vein to the marginal, vein to the ramus intermediate, and vein to the PDA. In October 2011 catheterization by me showed severe CAD with 62 - 80% ostial left main stenosis, total occlusion of the LAD after diagonal, 95% ramus intermediate stenosis, total occlusion of the marginal branch of the circumflex coronary artery, and severe diffuse native RCA disease with total occlusion of the PDA at the ostium. A graft that supplied the marginal was occluded as was the vein graft that supplied the ramus intermediate vessel. He had patent vein graft supplying the PDA branch of his RCA and a patent LIMA to the LAD. He has been on medical therapy. I did try adding Ranexa to his medical regimen but he did not tolerate this. He does have mild renal insufficiency with creatinines in the 1.5-1.6 range which have been followed by [name] an now [name]. He has a hiatal hernia. He had been on Crestor as well as Vytorin in the past but admits to myalgias in the past on statins.  Recently, he had been on Zetia.  However, he has self discontinued the Zetia since he felt he was having some vague muscle aches.  Over the past  7 months he denies recurrent chest pain.  He is unaware of palpitations.  He denies PND or orthopnea.  While on study zetia his lipids were stable with a total cholesterol 155, triglycerides 187, HDL 29, LDL 89.  LDL particle #1079, HDL particle number of 24.1, small LDL particles at 775. His BUN is 31 creatinine 1.7 AST was normal at 19 ALT borderline increased at 46. PSA normal at 0.58 the hemoglobin A1c 5.6.  He has not had any  blood work since he has been off Zetia.  Past Medical History  Diagnosis Date  . Hypertension   . Hyperlipidemia   . GERD (gastroesophageal reflux disease)   . Spinal stenosis   . Migraines     OCULAR  . BPH (benign prostatic hyperplasia)   . CKD (chronic kidney disease), stage III   . Vitamin deficiency   . Diverticulitis   . Heart murmur   . Calcium oxalate renal stones   . Renal lesion   . Dry senile macular degeneration   . Allergic rhinitis   . Anemia   . Peripheral neuropathy   . Thrombocytosis 08/18/2012    Platelets 711,000  Hb 14, WBC 8,500 08/05/12  . Hearing loss in left ear   . Essential thrombocythemia 10/10/2013  . Anemia, normocytic normochromic 10/10/2013  . Anemia, chronic renal failure 10/10/2013    Past Surgical History  Procedure Laterality Date  . Tonsillectomy  1937  . Appendectomy  1942  . Inguinal hernia repair      x2  . Coronary artery bypass graft  1996  . Nasal concha bullosa resection    . Laparoscopic cholecystectomy  1992  . Left renal mass ablated  12/11    No Known Allergies  Current Outpatient Prescriptions  Medication Sig Dispense Refill  . aluminum & magnesium hydroxide-simethicone (MYLANTA) 500-450-40  MG/5ML suspension Take by mouth every 6 (six) hours as needed. 200-200-47m/5ml       . aspirin 81 MG tablet Take 81 mg by mouth daily.        . cholecalciferol (VITAMIN D) 1000 UNITS tablet Take 2,000 Units by mouth daily.       . Coenzyme Q10 (CO Q-10) 300 MG CAPS Take 1 capsule by mouth daily.      . Cyanocobalamin (VITAMIN B-12 IJ) Inject as directed. monthly       . finasteride (PROSCAR) 5 MG tablet Take 1 tablet by mouth 3 (three) times a week. TIW      . fluticasone (FLONASE) 50 MCG/ACT nasal spray Place 1 spray into the nose as needed.       .Marland Kitchenlosartan (COZAAR) 25 MG tablet Take 1 tablet (25 mg total) by mouth daily.  90 tablet  3  . LUTEIN PO Take 6 mg by mouth daily.       . Melatonin 3 MG TABS Take 1 tablet by mouth Nightly.       . metoprolol tartrate (LOPRESSOR) 25 MG tablet Take 1 tablet (25 mg total) by mouth 2 (two) times  daily.  180 tablet  3  . Multiple Vitamin (MULTIVITAMIN) tablet Take 1 tablet by mouth daily.        . Multiple Vitamins-Minerals (PRESERVISION AREDS PO) Take 1 capsule by mouth 2 (two) times daily.        .Marland KitchenNITROSTAT 0.4 MG SL tablet as directed.      . ranitidine (ZANTAC) 150 MG tablet Take 150 mg by mouth daily.      . rosuvastatin (CRESTOR) 10 MG tablet Take 1 tablet (10 mg total) by mouth daily.  90 tablet  3  . triazolam (HALCION) 0.125 MG tablet Take 1 tablet (0.125 mg total) by mouth at bedtime as needed for sleep.  90 tablet  0   No current facility-administered medications for this visit.    History   Social History  . Marital Status: Widowed    Spouse Name: N/A    Number of Children: 3  . Years of Education: N/A   Occupational History  . Not on file.   Social History Main Topics  . Smoking status: Never Smoker   . Smokeless tobacco: Never Used  . Alcohol Use: No  . Drug Use: No  . Sexual Activity: Not on file   Other Topics Concern  . Not on file   Social History Narrative  . No narrative on file    Socially he is widowed. He is retired [employer] account executive  He has 3 children and 9 grandchildren. There's no tobacco use.   ROS General: Negative; No fevers, chills, or night sweats;  HEENT: Negative; No changes in vision or hearing, sinus congestion, difficulty swallowing Pulmonary: Negative; No cough, wheezing, shortness of breath, hemoptysis Cardiovascular:  See HPI; No chest pain, presyncope, syncope, palpitations GI: Positive for a large hiatal hernia; No nausea, vomiting, diarrhea, or abdominal pain GU: Negative; No dysuria, hematuria, or difficulty voiding Musculoskeletal: Negative; no myalgias, joint pain, or weakness Hematologic/Oncology: He is followed by Dr. GPhillip Healfor thrombocytosi.; no easy bruising, bleeding Endocrine: Negative; no  heat/cold intolerance; no diabetes Neuro: Negative; no changes in balance, headaches Skin: Negative; No rashes or skin lesions Psychiatric: Negative; No behavioral problems, depression Sleep: Negative; No snoring, daytime sleepiness, hypersomnolence, bruxism, restless legs, hypnogognic hallucinations, no cataplexy Other comprehensive 14 point system review is negative.   PE BP 111/62  Pulse 52  Ht 5' 6"  (1.676 m)  Wt 131 lb (59.421 kg)  BMI 21.15 kg/m2  General: Alert, oriented, no distress.  Skin: normal turgor, no rashes HEENT: Normocephalic, atraumatic. Pupils round and reactive; sclera anicteric;no lid lag.  Nose without nasal septal hypertrophy Mouth/Parynx benign; Mallinpatti scale 2 Neck: No JVD, no carotid bruits with normal carotid Lungs: clear to ausculatation and percussion; no wheezing or rales Chest wall: No tenderness to Heart: RRR, s1 s2 normal 1/6 systolic and 9-3/8 diastolic murmur compatible with his documented aortic insufficiency; no S3 or S4 gallop. No parasternal lift, heave, or rub Abdomen: soft, nontender; no hepatosplenomehaly, BS+; abdominal aorta nontender and not dilated by palpation. Back: No CVA tenderness the Pulses 2+ Extremities: no clubbing cyanosis or edema, Homan's sign negative  Neurologic: grossly nonfocal Psychological: Normal affect and mood  ECG (independently read by me): Sinus bradycardia 52 beats per minute.  Normal intervals.  No ST segment changes.  06/08/2013 ECG: Sinus bradycardia 49 beats per minute. No ectopy. Normal intervals.  Prior ECG of 11/30/2012: Sinus rhythm at 53 beats per minute. Normal intervals.  LABS:  BMET    Component Value Date/Time   NA 141 01/16/2014 0917   NA 142 03/18/2013 1437   K 4.5 01/16/2014 0917   K 4.5 03/18/2013 1437   CL 105 01/16/2014 0917   CL 108* 09/01/2012 0950   CO2 23 01/16/2014 0917   CO2 23 03/18/2013 1437   GLUCOSE 96 01/16/2014 0917   GLUCOSE 84 03/18/2013 1437   GLUCOSE 116*  09/01/2012 0950   BUN 26* 01/16/2014 0917   BUN 28.2* 03/18/2013 1437   CREATININE 1.70* 01/16/2014 0917   CREATININE 1.82* 10/12/2013 0830   CREATININE 1.6* 03/18/2013 1437   CREATININE 1.49 10/18/2009 0330   CALCIUM 9.1 01/16/2014 0917   CALCIUM 9.7 03/18/2013 1437   GFRNONAA 35* 01/16/2014 0917   GFRNONAA 45* 10/18/2009 0330   GFRAA 40* 01/16/2014 0917   GFRAA  Value: 54        The eGFR has been calculated using the MDRD equation. This calculation has not been validated in all clinical situations. eGFR's persistently <60 mL/min signify possible Chronic Kidney Disease.* 10/18/2009 0330     Hepatic Function Panel     Component Value Date/Time   PROT 6.5 10/11/2013 1512   PROT 7.3 09/01/2012 0950   ALBUMIN 3.8 09/06/2013 0950   ALBUMIN 3.5 09/01/2012 0950   AST 20 09/06/2013 0950   AST 22 09/01/2012 0950   ALT 13 09/06/2013 0950   ALT 16 09/01/2012 0950   ALKPHOS 60 09/06/2013 0950   ALKPHOS 82 09/01/2012 0950   BILITOT 0.5 09/06/2013 0950   BILITOT 0.40 09/01/2012 0950     CBC    Component Value Date/Time   WBC 9.0 01/16/2014 0917   WBC 9.5 03/18/2013 1437   RBC 4.10* 01/16/2014 0917   RBC 4.12* 03/18/2013 1437   HGB 12.8* 01/16/2014 0917   HGB 12.7* 03/18/2013 1437   HCT 39.2 01/16/2014 0917   HCT 39.3 03/18/2013 1437   PLT 841* 01/16/2014 0917   PLT 710* 03/18/2013 1437   MCV 95.6 01/16/2014 0917   MCV 95.4 03/18/2013 1437   MCH 31.2 01/16/2014 0917   MCH 30.8 03/18/2013 1437   MCHC 32.7 01/16/2014 0917   MCHC 32.3 03/18/2013 1437   RDW 15.1 01/16/2014 0917   RDW 15.0* 03/18/2013 1437   LYMPHSABS 2.6 01/16/2014 0917   LYMPHSABS 2.3 03/18/2013 1437   MONOABS 0.6 01/16/2014 1017  MONOABS 0.7 03/18/2013 1437   EOSABS 0.7 01/16/2014 0917   EOSABS 0.3 03/18/2013 1437   BASOSABS 0.4* 01/16/2014 0917   BASOSABS 0.3* 03/18/2013 1437     BNP No results found for this basename: probnp    Lipid Panel  No results found for this basename: chol,  trig,  hdl,  cholhdl,  vldl,  ldlcalc      RADIOLOGY: No results found.    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 He is 19 years status post CBG revascularization surgery which was done while living in [place]. He has documented graft occlusion of 2 of his grafts and has severe native coronary artery disease. Remotely, we tried adding Ranexa to his medication but he has stopped this due to concerns of tolerability.  He currently is on metoprolol tartrate 25 mg twice a day in addition to losartan 25 mg daily.  He's not having any anginal symptoms.  He's not having any exertional shortness of breath.  He had significantly improved with reference to lipid lowering therapy for remotely felt he was developing some mild myalgias on statins and more recently self discontinued Zetia.  Particularly with his left main disease and with 2 grafts being occluded, I have suggested another attempt at adding a very low dose statin therapy.  He's agreed to try Crestor 5 mg, which she'll initiate every other to every third day depending upon tolerability and hopefully to titrate this to daily.  Presently, his blood pressure is controlled.  He's not having any orthostatic symptoms.  He does have stage III chronic kidney disease.  I did review recent laboratory which revealed a BUN of 26 and his creatinine 1.7 from 01/16/2014.  Estimated Cr clearance was 35.  His most recent CBC did show a platelet count of 841,000, which has been fairly stable since April, however, in 2011, platelet counts were in the 300 range.  He's followed by Dr. Fatima Sanger for 2 and a he's not having significant GERD symptoms on ranitidine.  Previously he was having some difficulty with sleep and had taken zolpidem.  He now has started taking Halcion 0.125 mg.  I am rechecking laboratory in 3 months following initiation of low-dose Crestor therapy.  I will see him back in the office in 4 months for followup evaluation.   Troy Sine, MD,  [facility]  01/19/2014 8:01 AM

## 2014-01-23 ENCOUNTER — Encounter: Payer: Self-pay | Admitting: Oncology

## 2014-01-23 ENCOUNTER — Ambulatory Visit (INDEPENDENT_AMBULATORY_CARE_PROVIDER_SITE_OTHER): Payer: Medicare Other | Admitting: Oncology

## 2014-01-23 VITALS — BP 114/50 | HR 55 | Temp 97.0°F | Wt 132.5 lb

## 2014-01-23 DIAGNOSIS — N183 Chronic kidney disease, stage 3 unspecified: Secondary | ICD-10-CM

## 2014-01-23 DIAGNOSIS — D631 Anemia in chronic kidney disease: Secondary | ICD-10-CM | POA: Diagnosis not present

## 2014-01-23 DIAGNOSIS — I2510 Atherosclerotic heart disease of native coronary artery without angina pectoris: Secondary | ICD-10-CM

## 2014-01-23 DIAGNOSIS — D473 Essential (hemorrhagic) thrombocythemia: Secondary | ICD-10-CM | POA: Diagnosis not present

## 2014-01-23 DIAGNOSIS — N039 Chronic nephritic syndrome with unspecified morphologic changes: Secondary | ICD-10-CM

## 2014-01-23 DIAGNOSIS — D649 Anemia, unspecified: Secondary | ICD-10-CM

## 2014-01-23 DIAGNOSIS — D75839 Thrombocytosis, unspecified: Secondary | ICD-10-CM

## 2014-01-23 MED ORDER — HYDROXYUREA 500 MG PO CAPS: 500.0000 mg | ORAL_CAPSULE | Freq: Every day | ORAL | Status: DC

## 2014-01-23 NOTE — Progress Notes (Signed)
Patient ID: Alexander [name], Dr., male   DOB: 04/05/1922, 80 y.o.   MRN: [id number] Hematology and Oncology Follow Up Visit  TYQUELL STUCKEY, Dr. [id number] [birth year] 80 y.o. 01/23/2014 10:54 AM   Principle Diagnosis: Encounter Diagnoses  Name Primary?  . Thrombocytosis Yes  . Essential thrombocythemia   . Anemia, normocytic normochromic   . 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 I first saw him in April of 2014. Platelet count was 703,000, hemoglobin 13, hematocrit 41, white count 7400. Previous blood count from June 2011 with a platelet count of 329,000. He had a normal physical exam. He had no history of any thrombotic events. I elected to put him on 81 mg of aspirin alone and monitor his blood counts periodically. There has been a very slow trend for the platelet count to increase over the last year with most recent count done on 01/16/2014 of 841,000. Hemoglobin has remained in the 12-13 range and is likely secondary to his chronic renal dysfunction with creatinine 1.7-1.8, GFR on a 24-hour urine collection of 35 mL per minute done 10/12/2013.  He reports that since his last visit he had sudden onset of decreased hearing in his left ear. He discussed this with ear nose and throat surgeon Dr. Hermina Barters who felt that this might represent a thrombosis to a small vessel to his eighth nerve. It has not improved over time. He reports no other new signs or symptoms.  Medications: reviewed  Allergies: No Known Allergies  Review of Systems: Hematology:  No bleeding or bruising ENT ROS:  Breast ROS:  Respiratory ROS: No dyspnea Cardiovascular ROS: No ischemic type chest pain  Gastrointestinal ROS:    Genito-Urinary ROS:  Musculoskeletal ROS:  Neurological ROS: See above. No headache. No change in vision. Dermatological ROS:  Remaining ROS negative:    Physical Exam: Blood pressure 114/50, pulse 55, temperature 97 F (36.1 C), temperature source Oral, weight 132 lb 8 oz (60.102 kg), SpO2 95.00%. Wt Readings from Last 3 Encounters:  01/23/14 132 lb 8 oz (60.102 kg)  01/18/14 131 lb (59.421 kg)  09/06/13 133 lb 11.2 oz (60.646 kg)     General appearance: Thin Caucasian man HENNT: Pharynx no erythema, exudate, mass, or ulcer. No thyromegaly or thyroid nodules Lymph nodes: No cervical, supraclavicular, or axillary lymphadenopathy Breasts:  Lungs: Clear to auscultation, resonant to percussion throughout Heart: Regular rhythm, no murmur, no gallop, no rub, no click, no edema Abdomen: Soft, nontender, normal bowel sounds, no mass, questionable spleen tip palpable deep in the left upper quadrant. No hepatomegaly. Extremities: No edema, no calf tenderness Musculoskeletal: no joint deformities GU:  Vascular: Carotid pulses 2+, no bruits, distal pulses: Dorsalis pedis 1+ symmetric Neurologic: Alert, oriented, PERRLA, optic discs sharp and vessels normal, no hemorrhage or exudate, cranial nerves grossly normal except there is decreased hearing in the left ear., motor strength 5 over 5, reflexes 1+ symmetric at the biceps, absent symmetric at the knees., upper body coordination normal, finger to finger, finger hand, extraocular movements, rapid alternating movements, gait normal, Skin: No rash or ecchymosis  Lab Results: CBC W/Diff    Component Value Date/Time   WBC 9.0 01/16/2014 0917   WBC 9.5 03/18/2013 1437   RBC 4.10* 01/16/2014 0917   RBC 4.12* 03/18/2013 1437   HGB 12.8* 01/16/2014 0917   HGB 12.7* 03/18/2013 1437   HCT 39.2  01/16/2014 0917   HCT 39.3 03/18/2013 1437   PLT 841* 01/16/2014 0917   PLT 710* 03/18/2013 1437   MCV 95.6 01/16/2014 0917   MCV 95.4 03/18/2013 1437   MCH 31.2 01/16/2014 0917   MCH 30.8 03/18/2013 1437   MCHC 32.7 01/16/2014 0917   MCHC 32.3 03/18/2013 1437   RDW 15.1 01/16/2014 0917   RDW 15.0* 03/18/2013  1437   LYMPHSABS 2.6 01/16/2014 0917   LYMPHSABS 2.3 03/18/2013 1437   MONOABS 0.6 01/16/2014 0917   MONOABS 0.7 03/18/2013 1437   EOSABS 0.7 01/16/2014 0917   EOSABS 0.3 03/18/2013 1437   BASOSABS 0.4* 01/16/2014 0917   BASOSABS 0.3* 03/18/2013 1437     Chemistry      Component Value Date/Time   NA 141 01/16/2014 0917   NA 142 03/18/2013 1437   K 4.5 01/16/2014 0917   K 4.5 03/18/2013 1437   CL 105 01/16/2014 0917   CL 108* 09/01/2012 0950   CO2 23 01/16/2014 0917   CO2 23 03/18/2013 1437   BUN 26* 01/16/2014 0917   BUN 28.2* 03/18/2013 1437   CREATININE 1.70* 01/16/2014 0917   CREATININE 1.82* 10/12/2013 0830   CREATININE 1.6* 03/18/2013 1437   CREATININE 1.49 10/18/2009 0330      Component Value Date/Time   CALCIUM 9.1 01/16/2014 0917   CALCIUM 9.7 03/18/2013 1437   ALKPHOS 60 09/06/2013 0950   ALKPHOS 82 09/01/2012 0950   AST 20 09/06/2013 0950   AST 22 09/01/2012 0950   ALT 13 09/06/2013 0950   ALT 16 09/01/2012 0950   BILITOT 0.5 09/06/2013 0950   BILITOT 0.40 09/01/2012 0950        Impression:  #1. Essential thrombocythemia Questionable thrombotic event with acute decreased hearing in his left ear. At this point there is an indication to begin platelet lowering therapy. I am going to start him on hydroxyurea 500 mg by mouth daily. In general this drug is extremely well tolerated. I gave him a printout from the up to date database on potential side effects and we reviewed some of these together. Peak effect of the drug is at about 2 weeks. I will check a CBC at that time and then every 2 weeks until we are at steady state. Target platelet count will be less than 500,000. He understands that the drug may lower his white count and his hemoglobin as well as the platelet count but we are starting at a low dose and I do not anticipate any significant change in the other cell lines. He will also continue the aspirin 81 mg daily.  #2. Anemia of chronic renal insufficiency.  #3. 19 years  status post coronary bypass surgery.   CC: Patient Care Team: Jerlyn Ly, MD as PCP - General (Internal Medicine) Elsie Stain, MD as Attending Physician (Pulmonary Disease) Elsie Stain, MD as Consulting Physician (Pulmonary Disease) Troy Sine, MD as Consulting Physician (Cardiology) Annia Belt, MD as Consulting Physician (Hematology and Oncology) Mayme Genta, MD as Consulting Physician (Gastroenterology)   Annia Belt, MD 9/21/201510:54 AM

## 2014-01-23 NOTE — Patient Instructions (Signed)
Start Hydroxyurea 500 mg by mouth daily CBC ,diff every 2 weeks x 3 then monthly x 6 start   Lab on 02/06/14 Cmet, LDH, uric acid in 1 month MD visit 3 months:

## 2014-01-24 DIAGNOSIS — H353290 Exudative age-related macular degeneration, unspecified eye, stage unspecified: Secondary | ICD-10-CM | POA: Diagnosis not present

## 2014-01-24 DIAGNOSIS — H35729 Serous detachment of retinal pigment epithelium, unspecified eye: Secondary | ICD-10-CM | POA: Diagnosis not present

## 2014-01-24 DIAGNOSIS — H35369 Drusen (degenerative) of macula, unspecified eye: Secondary | ICD-10-CM | POA: Diagnosis not present

## 2014-01-24 DIAGNOSIS — H353190 Nonexudative age-related macular degeneration, unspecified eye, stage unspecified: Secondary | ICD-10-CM | POA: Diagnosis not present

## 2014-01-30 ENCOUNTER — Ambulatory Visit (INDEPENDENT_AMBULATORY_CARE_PROVIDER_SITE_OTHER): Payer: Medicare Other | Admitting: Podiatry

## 2014-01-30 ENCOUNTER — Other Ambulatory Visit: Payer: Medicare Other

## 2014-01-30 DIAGNOSIS — B351 Tinea unguium: Secondary | ICD-10-CM | POA: Diagnosis not present

## 2014-01-30 DIAGNOSIS — M79673 Pain in unspecified foot: Secondary | ICD-10-CM

## 2014-01-30 DIAGNOSIS — M79609 Pain in unspecified limb: Secondary | ICD-10-CM | POA: Diagnosis not present

## 2014-01-30 NOTE — Progress Notes (Signed)
   Subjective:    Patient ID: Alexander [name], Dr., male    DOB: March 19, 1922, 80 y.o.   MRN: [id number]  HPI  Pt presents for nail debridement  Review of Systems     Objective:   Physical Exam        Assessment & Plan:

## 2014-02-01 ENCOUNTER — Telehealth: Payer: Self-pay | Admitting: *Deleted

## 2014-02-01 NOTE — Telephone Encounter (Signed)
Please call Dr [name] back: Reassure pt OK to wait until 10/14 to start Hydrea.  As we discussed at time of his visit, this drug is very well tolerated in majority of people who take it and despite the 10 page print out I gave him, has minimal to no side effects

## 2014-02-01 NOTE — Telephone Encounter (Signed)
Message left per pt - pt states he's unable to start Hydroxyurea until 02/15/14 because he "has a lot on his plate" and he needs to find out its side effects and how it will affect him. And if the lab schedule needs to be change, to let him know. But it Dr [name] needs to call him, telephone # is [phone]. Thanks

## 2014-02-01 NOTE — Telephone Encounter (Signed)
Pt called/informed its ok to start Hydrea on Oct 14th per Dr [name]; reminded of 10 page report given to pt by his doctor (pt stated 12 page report); reiterate this medication is well tolerated by majority of people And has minimal side effects. Pt states need to re-schedule his lab appts since he will not be starting Hydrea until 10/14; I sent a message to [facility]. Pt request schedule to be mailed to him.

## 2014-02-01 NOTE — Progress Notes (Signed)
Subjective:     Patient ID: Alexander [name], Dr., male   DOB: [birth year], 79 y.o.   MRN: [id number]  HPI patient presents with nail disease 1-5 both feet that he cannot cut and they become painful   Review of Systems     Objective:   Physical Exam Neurovascular status intact with thick yellow brittle nailbeds 1-5 both feet that are painful    Assessment:     Mycotic nail infection with pain 1-5 both feet    Plan:     Debride painful nailbeds 1-5 both feet with no iatrogenic bleeding noted

## 2014-02-02 NOTE — Telephone Encounter (Signed)
Appointments mailed.

## 2014-02-06 ENCOUNTER — Other Ambulatory Visit: Payer: Medicare Other

## 2014-02-08 DIAGNOSIS — H7201 Central perforation of tympanic membrane, right ear: Secondary | ICD-10-CM | POA: Diagnosis not present

## 2014-02-08 DIAGNOSIS — J300 Vasomotor rhinitis: Secondary | ICD-10-CM | POA: Diagnosis not present

## 2014-02-08 DIAGNOSIS — H903 Sensorineural hearing loss, bilateral: Secondary | ICD-10-CM | POA: Diagnosis not present

## 2014-02-13 DIAGNOSIS — I10 Essential (primary) hypertension: Secondary | ICD-10-CM | POA: Diagnosis not present

## 2014-02-13 DIAGNOSIS — N183 Chronic kidney disease, stage 3 (moderate): Secondary | ICD-10-CM | POA: Diagnosis not present

## 2014-02-16 ENCOUNTER — Telehealth: Payer: Self-pay | Admitting: *Deleted

## 2014-02-16 NOTE — Telephone Encounter (Signed)
Returned pt's call - pt wanted to know when he should start taking Hydroxyurea. I reminded him, he wanted to start taking on the 14th and Dr [name] said it was OK. He stated he will start taking it today.

## 2014-02-17 ENCOUNTER — Other Ambulatory Visit: Payer: Self-pay

## 2014-02-20 ENCOUNTER — Other Ambulatory Visit: Payer: Medicare Other

## 2014-03-03 ENCOUNTER — Other Ambulatory Visit: Payer: Self-pay | Admitting: Oncology

## 2014-03-03 DIAGNOSIS — D473 Essential (hemorrhagic) thrombocythemia: Secondary | ICD-10-CM

## 2014-03-03 DIAGNOSIS — N183 Chronic kidney disease, stage 3 unspecified: Secondary | ICD-10-CM

## 2014-03-06 ENCOUNTER — Other Ambulatory Visit: Payer: Medicare Other

## 2014-03-06 ENCOUNTER — Other Ambulatory Visit (INDEPENDENT_AMBULATORY_CARE_PROVIDER_SITE_OTHER): Payer: Medicare Other

## 2014-03-06 DIAGNOSIS — D473 Essential (hemorrhagic) thrombocythemia: Secondary | ICD-10-CM | POA: Diagnosis not present

## 2014-03-06 DIAGNOSIS — N183 Chronic kidney disease, stage 3 unspecified: Secondary | ICD-10-CM

## 2014-03-06 LAB — CBC WITH DIFFERENTIAL/PLATELET
Basophils Absolute: 0.3 10*3/uL — ABNORMAL HIGH (ref 0.0–0.1)
Basophils Relative: 4 % — ABNORMAL HIGH (ref 0–1)
EOS ABS: 0.5 10*3/uL (ref 0.0–0.7)
EOS PCT: 7 % — AB (ref 0–5)
HCT: 38.1 % — ABNORMAL LOW (ref 39.0–52.0)
HEMOGLOBIN: 12.2 g/dL — AB (ref 13.0–17.0)
Lymphocytes Relative: 24 % (ref 12–46)
Lymphs Abs: 1.8 10*3/uL (ref 0.7–4.0)
MCH: 31.6 pg (ref 26.0–34.0)
MCHC: 32 g/dL (ref 30.0–36.0)
MCV: 98.7 fL (ref 78.0–100.0)
MONOS PCT: 6 % (ref 3–12)
Monocytes Absolute: 0.5 10*3/uL (ref 0.1–1.0)
NEUTROS PCT: 59 % (ref 43–77)
Neutro Abs: 4.4 10*3/uL (ref 1.7–7.7)
Platelets: 721 10*3/uL — ABNORMAL HIGH (ref 150–400)
RBC: 3.86 MIL/uL — ABNORMAL LOW (ref 4.22–5.81)
RDW: 15.6 % — ABNORMAL HIGH (ref 11.5–15.5)
WBC: 7.5 10*3/uL (ref 4.0–10.5)

## 2014-03-07 ENCOUNTER — Encounter: Payer: Self-pay | Admitting: Oncology

## 2014-03-07 ENCOUNTER — Other Ambulatory Visit: Payer: Self-pay | Admitting: Oncology

## 2014-03-07 ENCOUNTER — Telehealth: Payer: Self-pay | Admitting: *Deleted

## 2014-03-07 MED ORDER — HYDROXYUREA 500 MG PO CAPS: 1000.0000 mg | ORAL_CAPSULE | Freq: Every day | ORAL | Status: DC

## 2014-03-07 NOTE — Telephone Encounter (Signed)
done

## 2014-03-07 NOTE — Progress Notes (Unsigned)
Patient ID: Alexander [name], Dr., male   DOB: 09-06-1921, 80 y.o.   MRN: [id number] Dr [name] now on Hydrea 500 mg daily for last 2 weeks.  Platelet count down from  841,000 to 721,000. I called and advised him to increase to 1,000 mg daily.  Continue Q 2 week lab checks as we titrate dose to target count <500,000.

## 2014-03-07 NOTE — Telephone Encounter (Signed)
Pharmacist calls and states new script was sent but was sent for #30 with directions to take 1 twice daily, could you please change to # 60, she does add that pt wants #100, please advise

## 2014-03-10 DIAGNOSIS — H2512 Age-related nuclear cataract, left eye: Secondary | ICD-10-CM | POA: Diagnosis not present

## 2014-03-16 DIAGNOSIS — Z951 Presence of aortocoronary bypass graft: Secondary | ICD-10-CM | POA: Diagnosis not present

## 2014-03-16 DIAGNOSIS — D759 Disease of blood and blood-forming organs, unspecified: Secondary | ICD-10-CM | POA: Diagnosis not present

## 2014-03-16 DIAGNOSIS — Z7982 Long term (current) use of aspirin: Secondary | ICD-10-CM | POA: Diagnosis not present

## 2014-03-16 DIAGNOSIS — I2510 Atherosclerotic heart disease of native coronary artery without angina pectoris: Secondary | ICD-10-CM | POA: Diagnosis not present

## 2014-03-16 DIAGNOSIS — I10 Essential (primary) hypertension: Secondary | ICD-10-CM | POA: Diagnosis not present

## 2014-03-16 DIAGNOSIS — H25812 Combined forms of age-related cataract, left eye: Secondary | ICD-10-CM | POA: Diagnosis not present

## 2014-03-16 DIAGNOSIS — H2512 Age-related nuclear cataract, left eye: Secondary | ICD-10-CM | POA: Diagnosis not present

## 2014-03-16 DIAGNOSIS — E780 Pure hypercholesterolemia: Secondary | ICD-10-CM | POA: Diagnosis not present

## 2014-03-16 DIAGNOSIS — M1990 Unspecified osteoarthritis, unspecified site: Secondary | ICD-10-CM | POA: Diagnosis not present

## 2014-03-20 ENCOUNTER — Other Ambulatory Visit: Payer: Medicare Other

## 2014-03-27 DIAGNOSIS — J300 Vasomotor rhinitis: Secondary | ICD-10-CM | POA: Diagnosis not present

## 2014-03-27 DIAGNOSIS — H903 Sensorineural hearing loss, bilateral: Secondary | ICD-10-CM | POA: Diagnosis not present

## 2014-04-03 ENCOUNTER — Other Ambulatory Visit: Payer: Medicare Other

## 2014-04-03 ENCOUNTER — Ambulatory Visit (INDEPENDENT_AMBULATORY_CARE_PROVIDER_SITE_OTHER): Payer: Medicare Other | Admitting: Podiatry

## 2014-04-03 DIAGNOSIS — B351 Tinea unguium: Secondary | ICD-10-CM | POA: Diagnosis not present

## 2014-04-03 DIAGNOSIS — M79673 Pain in unspecified foot: Secondary | ICD-10-CM | POA: Diagnosis not present

## 2014-04-03 NOTE — Progress Notes (Signed)
Subjective:     Patient ID: Alexander [name], Dr., male   DOB: 08/29/1921, 80 y.o.   MRN: [id number]  HPI patient presents with nail disease 1-5 both feet that he cannot cut and they become painful   Review of Systems     Objective:   Physical Exam Neurovascular status intact with thick yellow brittle nailbeds 1-5 both feet that are painful    Assessment:     Mycotic nail infection with pain 1-5 both feet    Plan:     Debride painful nailbeds 1-5 both feet with no iatrogenic bleeding noted

## 2014-04-05 ENCOUNTER — Other Ambulatory Visit: Payer: Medicare Other

## 2014-04-05 ENCOUNTER — Other Ambulatory Visit (INDEPENDENT_AMBULATORY_CARE_PROVIDER_SITE_OTHER): Payer: Medicare Other

## 2014-04-05 DIAGNOSIS — D473 Essential (hemorrhagic) thrombocythemia: Secondary | ICD-10-CM

## 2014-04-05 DIAGNOSIS — N183 Chronic kidney disease, stage 3 unspecified: Secondary | ICD-10-CM

## 2014-04-05 LAB — COMPLETE METABOLIC PANEL WITH GFR
ALK PHOS: 49 U/L (ref 39–117)
ALT: 29 U/L (ref 0–53)
AST: 30 U/L (ref 0–37)
Albumin: 3.9 g/dL (ref 3.5–5.2)
BUN: 37 mg/dL — ABNORMAL HIGH (ref 6–23)
CO2: 24 mEq/L (ref 19–32)
CREATININE: 1.74 mg/dL — AB (ref 0.50–1.35)
Calcium: 9.2 mg/dL (ref 8.4–10.5)
Chloride: 107 mEq/L (ref 96–112)
GFR, EST NON AFRICAN AMERICAN: 34 mL/min — AB
GFR, Est African American: 39 mL/min — ABNORMAL LOW
GLUCOSE: 133 mg/dL — AB (ref 70–99)
Potassium: 4.4 mEq/L (ref 3.5–5.3)
Sodium: 140 mEq/L (ref 135–145)
Total Bilirubin: 0.8 mg/dL (ref 0.2–1.2)
Total Protein: 6.5 g/dL (ref 6.0–8.3)

## 2014-04-05 LAB — CBC WITH DIFFERENTIAL/PLATELET
BASOS ABS: 0.1 10*3/uL (ref 0.0–0.1)
Basophils Relative: 3 % — ABNORMAL HIGH (ref 0–1)
EOS ABS: 0.2 10*3/uL (ref 0.0–0.7)
EOS PCT: 5 % (ref 0–5)
HCT: 31.7 % — ABNORMAL LOW (ref 39.0–52.0)
Hemoglobin: 10.7 g/dL — ABNORMAL LOW (ref 13.0–17.0)
Lymphocytes Relative: 30 % (ref 12–46)
Lymphs Abs: 1.2 10*3/uL (ref 0.7–4.0)
MCH: 32.9 pg (ref 26.0–34.0)
MCHC: 33.8 g/dL (ref 30.0–36.0)
MCV: 97.5 fL (ref 78.0–100.0)
MPV: 9.4 fL (ref 9.4–12.4)
Monocytes Absolute: 0.2 10*3/uL (ref 0.1–1.0)
Monocytes Relative: 4 % (ref 3–12)
Neutro Abs: 2.3 10*3/uL (ref 1.7–7.7)
Neutrophils Relative %: 58 % (ref 43–77)
PLATELETS: 177 10*3/uL (ref 150–400)
RBC: 3.25 MIL/uL — ABNORMAL LOW (ref 4.22–5.81)
RDW: 19.9 % — AB (ref 11.5–15.5)
WBC: 3.9 10*3/uL — AB (ref 4.0–10.5)

## 2014-04-05 LAB — LACTATE DEHYDROGENASE: LDH: 125 U/L (ref 94–250)

## 2014-04-05 LAB — URIC ACID: Uric Acid, Serum: 5.6 mg/dL (ref 4.0–7.8)

## 2014-04-06 ENCOUNTER — Encounter: Payer: Self-pay | Admitting: Oncology

## 2014-04-06 ENCOUNTER — Telehealth: Payer: Self-pay | Admitting: *Deleted

## 2014-04-06 NOTE — Telephone Encounter (Signed)
Pt called and instructed to change Hydrea to 500 mg BID on Mon/Wed/Fri only and take 500 mg on the other days; repeat CBC in 2 weeks per Dr [name].  Pt stated he will start the the regimen on Monday 12/7. Pt also requested to know lab values- surprised Platelet count down to 177 from 721. Two week blood draw wiil be on 12/21. Pt has an appt with Dr [name] on 12/14.   Dr Darnell Level Pt wants wants to know if he should keep 12/14 appt since next lab draw will be on the 21st? Thanks

## 2014-04-06 NOTE — Telephone Encounter (Signed)
-----   Message from Annia Belt, MD sent at 04/06/2014 10:41 AM EST ----- Alexander [name] - I was unable to reach patient.  Please advise him to change Hydrea from 500 mg BID to 500 mg BID Mon/Wed/Fri only then 500 mg other days of week. Repeat CBC in 2 weeks Thx DrG

## 2014-04-06 NOTE — Progress Notes (Unsigned)
Patient ID: Alexander [name], Dr., male   DOB: [birth year], 80 y.o.   MRN: [id number] Blood counts now with normalization of platelets at the expense of decreased Hb & WBC.  I will continue to titrate dose of Hydrea: Change from 500 mg BID daily to 500 mg BID M/W/F, 500 mg other days of week. Repeat CBC in 2 weeks.  I was unable to reach pt. I will have nurse try since I will be out of town until 12/9

## 2014-04-07 DIAGNOSIS — H3532 Exudative age-related macular degeneration: Secondary | ICD-10-CM | POA: Diagnosis not present

## 2014-04-10 DIAGNOSIS — L821 Other seborrheic keratosis: Secondary | ICD-10-CM | POA: Diagnosis not present

## 2014-04-12 ENCOUNTER — Other Ambulatory Visit: Payer: Self-pay | Admitting: *Deleted

## 2014-04-12 ENCOUNTER — Encounter: Payer: Self-pay | Admitting: *Deleted

## 2014-04-12 DIAGNOSIS — R5381 Other malaise: Secondary | ICD-10-CM

## 2014-04-12 DIAGNOSIS — E785 Hyperlipidemia, unspecified: Secondary | ICD-10-CM

## 2014-04-12 DIAGNOSIS — I351 Nonrheumatic aortic (valve) insufficiency: Secondary | ICD-10-CM

## 2014-04-12 DIAGNOSIS — I10 Essential (primary) hypertension: Secondary | ICD-10-CM

## 2014-04-12 DIAGNOSIS — Z79899 Other long term (current) drug therapy: Secondary | ICD-10-CM

## 2014-04-12 DIAGNOSIS — D689 Coagulation defect, unspecified: Secondary | ICD-10-CM

## 2014-04-12 DIAGNOSIS — R5383 Other fatigue: Secondary | ICD-10-CM

## 2014-04-12 DIAGNOSIS — E782 Mixed hyperlipidemia: Secondary | ICD-10-CM

## 2014-04-17 ENCOUNTER — Encounter: Payer: Self-pay | Admitting: Oncology

## 2014-04-17 ENCOUNTER — Ambulatory Visit (INDEPENDENT_AMBULATORY_CARE_PROVIDER_SITE_OTHER): Payer: Medicare Other | Admitting: Oncology

## 2014-04-17 VITALS — BP 123/47 | HR 54 | Temp 97.6°F | Ht 66.5 in | Wt 130.7 lb

## 2014-04-17 DIAGNOSIS — I2510 Atherosclerotic heart disease of native coronary artery without angina pectoris: Secondary | ICD-10-CM | POA: Diagnosis not present

## 2014-04-17 DIAGNOSIS — Z7951 Long term (current) use of inhaled steroids: Secondary | ICD-10-CM

## 2014-04-17 DIAGNOSIS — D473 Essential (hemorrhagic) thrombocythemia: Secondary | ICD-10-CM | POA: Diagnosis not present

## 2014-04-17 DIAGNOSIS — Z7982 Long term (current) use of aspirin: Secondary | ICD-10-CM

## 2014-04-17 LAB — CBC WITH DIFFERENTIAL/PLATELET
BASOS PCT: 1 % (ref 0–1)
Basophils Absolute: 0 10*3/uL (ref 0.0–0.1)
EOS PCT: 4 % (ref 0–5)
Eosinophils Absolute: 0.1 10*3/uL (ref 0.0–0.7)
HCT: 29.9 % — ABNORMAL LOW (ref 39.0–52.0)
Hemoglobin: 10 g/dL — ABNORMAL LOW (ref 13.0–17.0)
Lymphocytes Relative: 46 % (ref 12–46)
Lymphs Abs: 1.3 10*3/uL (ref 0.7–4.0)
MCH: 34.5 pg — ABNORMAL HIGH (ref 26.0–34.0)
MCHC: 33.4 g/dL (ref 30.0–36.0)
MCV: 103.1 fL — AB (ref 78.0–100.0)
Monocytes Absolute: 0.4 10*3/uL (ref 0.1–1.0)
Monocytes Relative: 15 % — ABNORMAL HIGH (ref 3–12)
NEUTROS PCT: 34 % — AB (ref 43–77)
Neutro Abs: 1 10*3/uL — ABNORMAL LOW (ref 1.7–7.7)
PLATELETS: 160 10*3/uL (ref 150–400)
RBC: 2.9 MIL/uL — ABNORMAL LOW (ref 4.22–5.81)
RDW: 22.6 % — AB (ref 11.5–15.5)
WBC: 2.8 10*3/uL — ABNORMAL LOW (ref 4.0–10.5)

## 2014-04-17 NOTE — Patient Instructions (Signed)
To lab today then begin monthly CBCs starting on 05/03/14 MD visit in 3 months

## 2014-04-17 NOTE — Telephone Encounter (Signed)
Seen in office today  

## 2014-04-17 NOTE — Progress Notes (Signed)
Hematology and Oncology Follow Up Visit  Alexander [name], Alexander [name] 1922/03/10 80 y.o. 04/17/2014 10:20 AM   Principle Diagnosis: Encounter Diagnosis  Name Primary?  . 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  I have now followed him since May 2014 when he was evaluated for an elevated platelet count and found to have a mutation in the JAK-2 gene consistent with a diagnosis of essential thrombocythemia. Platelet count running in the 7-800000 range. He had no prior history of any thrombotic events. I elected to start him on low-dose aspirin and observation. However, at the time of a 01/23/2014 visit, he reported to me an acute change in hearing in his left ear. His ear nose and throat surgeon felt that this might represent a thrombotic event to a small vessel to his eighth nerve. I elected to start him on platelet lowering therapy with hydroxyurea at that time. Initial dose 500 mg daily. Dose titrated to 1 g daily. Secondary to very rapid response, I made a recent dose adjustment down to 1 g Monday Wednesday Friday with 500 mg on the other days of the week. He has tolerated the medication well with no obvious side effects. Most recent CBC prior to making the dose adjustment noted above show platelet count now down in the normal range at 177,000 at the expense of fall in his white count to 3900 and his hemoglobin to 10.7 g.  He has not had any return in the hearing in his left ear. He denies any other neurologic signs or symptoms. Specifically no headache, change in vision, dysarthria, focal weakness, or paresthesias.  He has had no other interim medical problems. He saw his cardiologist. He was started on a low-dose statin Crestor 20 mg 3 times a week.    Medications: reviewed  Allergies: No Known Allergies  Review of Systems: See history of present illness Remaining ROS negative:   Physical Exam: Blood pressure 123/47,  pulse 54, temperature 97.6 F (36.4 C), temperature source Oral, height 5' 6.5" (1.689 m), weight 130 lb 11.2 oz (59.285 kg), SpO2 100 %. Wt Readings from Last 3 Encounters:  04/17/14 130 lb 11.2 oz (59.285 kg)  01/23/14 132 lb 8 oz (60.102 kg)  01/18/14 131 lb (59.421 kg)     General appearance: Thin Caucasian man HENNT: Pharynx no erythema, exudate, mass, or ulcer. No thyromegaly or thyroid nodules Lymph nodes: No cervical, supraclavicular, or axillary lymphadenopathy Breasts:  Lungs: Clear to auscultation, resonant to percussion throughout Heart: Regular rhythm, 2/6 aortic systolic murmur, no gallop, no rub, no click, no edema Abdomen: Soft, nontender, normal bowel sounds, no mass, no organomegaly Extremities: No edema, no calf tenderness Musculoskeletal: no joint deformities GU:  Vascular: Carotid pulses 2+, no bruits, Neurologic: Alert, oriented, PERRLA, , cranial nerves grossly normal except decreased hearing in the left ear. He was able to sense vibration over the left ear by tuning fork exam but significantly decreased left compared with right., motor strength 5 over 5, reflexes 1+ symmetric, upper body coordination normal, gait normal, minimal decrease in vibration sense over the fingertips. Skin: No rash or ecchymosis  Lab Results: CBC W/Diff    Component Value Date/Time   WBC 3.9* 04/05/2014 1034   WBC 9.5 03/18/2013 1437   RBC 3.25* 04/05/2014 1034   RBC 4.12* 03/18/2013 1437   HGB 10.7* 04/05/2014 1034   HGB 12.7* 03/18/2013 1437   HCT 31.7* 04/05/2014 1034  HCT 39.3 03/18/2013 1437   PLT 177 04/05/2014 1034   PLT 710* 03/18/2013 1437   MCV 97.5 04/05/2014 1034   MCV 95.4 03/18/2013 1437   MCH 32.9 04/05/2014 1034   MCH 30.8 03/18/2013 1437   MCHC 33.8 04/05/2014 1034   MCHC 32.3 03/18/2013 1437   RDW 19.9* 04/05/2014 1034   RDW 15.0* 03/18/2013 1437   LYMPHSABS 1.2 04/05/2014 1034   LYMPHSABS 2.3 03/18/2013 1437   MONOABS 0.2 04/05/2014 1034   MONOABS 0.7  03/18/2013 1437   EOSABS 0.2 04/05/2014 1034   EOSABS 0.3 03/18/2013 1437   BASOSABS 0.1 04/05/2014 1034   BASOSABS 0.3* 03/18/2013 1437     Chemistry      Component Value Date/Time   NA 140 04/05/2014 1034   NA 142 03/18/2013 1437   K 4.4 04/05/2014 1034   K 4.5 03/18/2013 1437   CL 107 04/05/2014 1034   CL 108* 09/01/2012 0950   CO2 24 04/05/2014 1034   CO2 23 03/18/2013 1437   BUN 37* 04/05/2014 1034   BUN 28.2* 03/18/2013 1437   CREATININE 1.74* 04/05/2014 1034   CREATININE 1.82* 10/12/2013 0830   CREATININE 1.6* 03/18/2013 1437   CREATININE 1.49 10/18/2009 0330      Component Value Date/Time   CALCIUM 9.2 04/05/2014 1034   CALCIUM 9.7 03/18/2013 1437   ALKPHOS 49 04/05/2014 1034   ALKPHOS 82 09/01/2012 0950   AST 30 04/05/2014 1034   AST 22 09/01/2012 0950   ALT 29 04/05/2014 1034   ALT 16 09/01/2012 0950   BILITOT 0.8 04/05/2014 1034   BILITOT 0.40 09/01/2012 0950       Radiological Studies: No results found.  Impression:  #1. Essential thrombocythemia Nice response to hydroxyurea. I will check a CBC today and make any further adjustments necessary. Once we have achieved steady state, then decrease frequency of lab work to monthly. Continue low-dose aspirin.  #2. Anemia of chronic renal insufficiency-stable  #3. Coronary artery disease 19 years status post bypass surgery.   CC: Patient Care Team: Jerlyn Ly, MD as PCP - General (Internal Medicine) Elsie Stain, MD as Attending Physician (Pulmonary Disease) Elsie Stain, MD as Consulting Physician (Pulmonary Disease) Troy Sine, MD as Consulting Physician (Cardiology) Annia Belt, MD as Consulting Physician (Hematology and Oncology) Mayme Genta, MD as Consulting Physician (Gastroenterology)   Annia Belt, MD 12/14/201510:20 AM

## 2014-04-18 LAB — PATHOLOGIST SMEAR REVIEW

## 2014-04-19 ENCOUNTER — Encounter: Payer: Self-pay | Admitting: Oncology

## 2014-04-19 NOTE — Progress Notes (Unsigned)
Patient ID: Alexander [name], Dr., male   DOB: 02-29-1922, 80 y.o.   MRN: [id number] I called the patient and left a message on his home phone. I anticipated that his blood counts done the other day on December 14 would show a further decrease. I just made a dose change. I don't expect we will get to steady state for about 2 weeks. Counts are lower but in a safe range and I will keep him on the currently recommended dose of 1 g of Hydrea Monday Wednesday Friday, 500 mg other days of the week. He will have a follow-up CBC on December 28.

## 2014-05-01 ENCOUNTER — Other Ambulatory Visit (INDEPENDENT_AMBULATORY_CARE_PROVIDER_SITE_OTHER): Payer: Medicare Other

## 2014-05-01 DIAGNOSIS — N183 Chronic kidney disease, stage 3 unspecified: Secondary | ICD-10-CM

## 2014-05-01 DIAGNOSIS — R5381 Other malaise: Secondary | ICD-10-CM

## 2014-05-01 DIAGNOSIS — I10 Essential (primary) hypertension: Secondary | ICD-10-CM

## 2014-05-01 DIAGNOSIS — R5383 Other fatigue: Secondary | ICD-10-CM | POA: Diagnosis not present

## 2014-05-01 DIAGNOSIS — D473 Essential (hemorrhagic) thrombocythemia: Secondary | ICD-10-CM

## 2014-05-01 DIAGNOSIS — E782 Mixed hyperlipidemia: Secondary | ICD-10-CM | POA: Diagnosis not present

## 2014-05-01 DIAGNOSIS — Z79899 Other long term (current) drug therapy: Secondary | ICD-10-CM | POA: Diagnosis not present

## 2014-05-01 DIAGNOSIS — I351 Nonrheumatic aortic (valve) insufficiency: Secondary | ICD-10-CM

## 2014-05-01 LAB — COMPLETE METABOLIC PANEL WITH GFR
ALK PHOS: 51 U/L (ref 39–117)
ALT: 17 U/L (ref 0–53)
AST: 21 U/L (ref 0–37)
Albumin: 4.1 g/dL (ref 3.5–5.2)
BILIRUBIN TOTAL: 0.8 mg/dL (ref 0.2–1.2)
BUN: 31 mg/dL — AB (ref 6–23)
CO2: 21 mEq/L (ref 19–32)
Calcium: 9.4 mg/dL (ref 8.4–10.5)
Chloride: 108 mEq/L (ref 96–112)
Creat: 1.74 mg/dL — ABNORMAL HIGH (ref 0.50–1.35)
GFR, EST NON AFRICAN AMERICAN: 34 mL/min — AB
GFR, Est African American: 39 mL/min — ABNORMAL LOW
Glucose, Bld: 96 mg/dL (ref 70–99)
Potassium: 4.6 mEq/L (ref 3.5–5.3)
Sodium: 142 mEq/L (ref 135–145)
Total Protein: 6.6 g/dL (ref 6.0–8.3)

## 2014-05-01 LAB — LIPID PANEL
CHOL/HDL RATIO: 3.2 ratio
Cholesterol: 133 mg/dL (ref 0–200)
HDL: 42 mg/dL (ref >39)
LDL CALC: 64 mg/dL (ref 0–99)
Triglycerides: 133 mg/dL (ref <150)
VLDL: 27 mg/dL (ref 0–40)

## 2014-05-02 ENCOUNTER — Encounter: Payer: Self-pay | Admitting: Oncology

## 2014-05-02 LAB — CBC WITH DIFFERENTIAL/PLATELET
Basophils Absolute: 0 10*3/uL (ref 0.0–0.1)
Basophils Relative: 1 % (ref 0–1)
Eosinophils Absolute: 0.1 10*3/uL (ref 0.0–0.7)
Eosinophils Relative: 3 % (ref 0–5)
HCT: 32.3 % — ABNORMAL LOW (ref 39.0–52.0)
Hemoglobin: 11 g/dL — ABNORMAL LOW (ref 13.0–17.0)
LYMPHS PCT: 43 % (ref 12–46)
Lymphs Abs: 1.8 10*3/uL (ref 0.7–4.0)
MCH: 36.2 pg — ABNORMAL HIGH (ref 26.0–34.0)
MCHC: 34.1 g/dL (ref 30.0–36.0)
MCV: 106.3 fL — ABNORMAL HIGH (ref 78.0–100.0)
MPV: 9.6 fL (ref 9.4–12.4)
Monocytes Absolute: 0.5 10*3/uL (ref 0.1–1.0)
Monocytes Relative: 12 % (ref 3–12)
NEUTROS PCT: 41 % — AB (ref 43–77)
Neutro Abs: 1.8 10*3/uL (ref 1.7–7.7)
PLATELETS: 150 10*3/uL (ref 150–400)
RBC: 3.04 MIL/uL — AB (ref 4.22–5.81)
RDW: 25.4 % — ABNORMAL HIGH (ref 11.5–15.5)
WBC: 4.3 10*3/uL (ref 4.0–10.5)

## 2014-05-02 LAB — TSH: TSH: 4.049 u[IU]/mL (ref 0.350–4.500)

## 2014-05-02 NOTE — Progress Notes (Signed)
Patient ID: Alexander [name], Dr., male   DOB: 02/16/1922, 80 y.o.   MRN: [id number] Patient called with 12/28 labs.  Much better with recent dose adjustment. Continue Hydrea 500 mg daily except 1,000 mg Mon/Wed/Fri. Decrease labs to Q month.

## 2014-05-04 ENCOUNTER — Telehealth: Payer: Self-pay | Admitting: *Deleted

## 2014-05-04 NOTE — Telephone Encounter (Signed)
Left Message on his home answer machine results released in [facility] Chart

## 2014-05-04 NOTE — Telephone Encounter (Signed)
-----   Message from Troy Sine, MD sent at 05/03/2014  4:13 PM EST ----- Labs good, renal fxn unchanged with Cr 1.74

## 2014-05-10 ENCOUNTER — Other Ambulatory Visit: Payer: Medicare Other

## 2014-05-29 ENCOUNTER — Other Ambulatory Visit (INDEPENDENT_AMBULATORY_CARE_PROVIDER_SITE_OTHER): Payer: Medicare Other

## 2014-05-29 DIAGNOSIS — D473 Essential (hemorrhagic) thrombocythemia: Secondary | ICD-10-CM | POA: Diagnosis not present

## 2014-05-29 DIAGNOSIS — N183 Chronic kidney disease, stage 3 unspecified: Secondary | ICD-10-CM

## 2014-05-30 LAB — CBC WITH DIFFERENTIAL/PLATELET
Basophils Absolute: 0 10*3/uL (ref 0.0–0.1)
Basophils Relative: 1 % (ref 0–1)
EOS PCT: 2 % (ref 0–5)
Eosinophils Absolute: 0.1 10*3/uL (ref 0.0–0.7)
HCT: 32.2 % — ABNORMAL LOW (ref 39.0–52.0)
Hemoglobin: 10.7 g/dL — ABNORMAL LOW (ref 13.0–17.0)
LYMPHS ABS: 1.3 10*3/uL (ref 0.7–4.0)
Lymphocytes Relative: 35 % (ref 12–46)
MCH: 38.6 pg — AB (ref 26.0–34.0)
MCHC: 33.2 g/dL (ref 30.0–36.0)
MCV: 116.2 fL — ABNORMAL HIGH (ref 78.0–100.0)
MPV: 10 fL (ref 8.6–12.4)
Monocytes Absolute: 0.4 10*3/uL (ref 0.1–1.0)
Monocytes Relative: 11 % (ref 3–12)
Neutro Abs: 1.9 10*3/uL (ref 1.7–7.7)
Neutrophils Relative %: 51 % (ref 43–77)
Platelets: 146 10*3/uL — ABNORMAL LOW (ref 150–400)
RBC: 2.77 MIL/uL — AB (ref 4.22–5.81)
WBC: 3.7 10*3/uL — ABNORMAL LOW (ref 4.0–10.5)

## 2014-05-31 ENCOUNTER — Other Ambulatory Visit: Payer: Self-pay | Admitting: Oncology

## 2014-05-31 ENCOUNTER — Encounter: Payer: Self-pay | Admitting: Oncology

## 2014-05-31 MED ORDER — HYDROXYUREA 500 MG PO CAPS: 500.0000 mg | ORAL_CAPSULE | Freq: Every day | ORAL | Status: DC

## 2014-05-31 NOTE — Progress Notes (Unsigned)
Patient ID: Alexander [name], Dr., male   DOB: [birth year], 81 y.o.   MRN: [id number] Patient call to discuss results of this weeks labs. I'm still titrating his Hydrea. I will make another dose adjustment at this time. He will decrease Hydrea to 1000 mg on Mondays and Thursdays only, 500 mg other days of the week. Continue every 2 week lab checks for now until he is on a stable dose.

## 2014-06-07 ENCOUNTER — Other Ambulatory Visit: Payer: Medicare Other

## 2014-06-08 DIAGNOSIS — L245 Irritant contact dermatitis due to other chemical products: Secondary | ICD-10-CM | POA: Diagnosis not present

## 2014-06-20 DIAGNOSIS — H3531 Nonexudative age-related macular degeneration: Secondary | ICD-10-CM | POA: Diagnosis not present

## 2014-06-20 DIAGNOSIS — H2511 Age-related nuclear cataract, right eye: Secondary | ICD-10-CM | POA: Diagnosis not present

## 2014-06-20 DIAGNOSIS — Z961 Presence of intraocular lens: Secondary | ICD-10-CM | POA: Diagnosis not present

## 2014-06-20 DIAGNOSIS — H3532 Exudative age-related macular degeneration: Secondary | ICD-10-CM | POA: Diagnosis not present

## 2014-06-26 ENCOUNTER — Other Ambulatory Visit (INDEPENDENT_AMBULATORY_CARE_PROVIDER_SITE_OTHER): Payer: Medicare Other

## 2014-06-26 DIAGNOSIS — D473 Essential (hemorrhagic) thrombocythemia: Secondary | ICD-10-CM | POA: Diagnosis not present

## 2014-06-26 DIAGNOSIS — N183 Chronic kidney disease, stage 3 unspecified: Secondary | ICD-10-CM

## 2014-06-26 LAB — CBC WITH DIFFERENTIAL/PLATELET
Basophils Absolute: 0 10*3/uL (ref 0.0–0.1)
Basophils Relative: 1 % (ref 0–1)
EOS PCT: 3 % (ref 0–5)
Eosinophils Absolute: 0.1 10*3/uL (ref 0.0–0.7)
HCT: 29.7 % — ABNORMAL LOW (ref 39.0–52.0)
Hemoglobin: 10.2 g/dL — ABNORMAL LOW (ref 13.0–17.0)
Lymphocytes Relative: 26 % (ref 12–46)
Lymphs Abs: 0.9 10*3/uL (ref 0.7–4.0)
MCH: 42.3 pg — AB (ref 26.0–34.0)
MCHC: 34.3 g/dL (ref 30.0–36.0)
MCV: 123.2 fL — ABNORMAL HIGH (ref 78.0–100.0)
MPV: 9.8 fL (ref 8.6–12.4)
Monocytes Absolute: 0.5 10*3/uL (ref 0.1–1.0)
Monocytes Relative: 14 % — ABNORMAL HIGH (ref 3–12)
NEUTROS ABS: 1.9 10*3/uL (ref 1.7–7.7)
NEUTROS PCT: 56 % (ref 43–77)
Platelets: 140 10*3/uL — ABNORMAL LOW (ref 150–400)
RBC: 2.41 MIL/uL — ABNORMAL LOW (ref 4.22–5.81)
RDW: 15.5 % (ref 11.5–15.5)
WBC: 3.4 10*3/uL — AB (ref 4.0–10.5)

## 2014-06-27 ENCOUNTER — Encounter: Payer: Self-pay | Admitting: Oncology

## 2014-06-27 ENCOUNTER — Telehealth: Payer: Self-pay | Admitting: Critical Care Medicine

## 2014-06-27 ENCOUNTER — Other Ambulatory Visit: Payer: Self-pay | Admitting: Oncology

## 2014-06-27 DIAGNOSIS — Z6822 Body mass index (BMI) 22.0-22.9, adult: Secondary | ICD-10-CM | POA: Diagnosis not present

## 2014-06-27 DIAGNOSIS — J209 Acute bronchitis, unspecified: Secondary | ICD-10-CM | POA: Diagnosis not present

## 2014-06-27 MED ORDER — HYDROXYUREA 500 MG PO CAPS: 500.0000 mg | ORAL_CAPSULE | Freq: Every day | ORAL | Status: DC

## 2014-06-27 NOTE — Telephone Encounter (Signed)
lmcb x1

## 2014-06-27 NOTE — Telephone Encounter (Signed)
Spoke with pt. States that he has been having issues with a URI and cough x2 weeks. Cough is producing clear mucus. Denies chest tightness or SOB. Would like a CXR done.  No Known Allergies  SN - please advise in PW's absence. Thanks.

## 2014-06-27 NOTE — Progress Notes (Unsigned)
Patient ID: Alexander [name], Dr., male   DOB: 1921-08-22, 81 y.o.   MRN: [id number] I called [name] to review lab done the other day. Counts are still lower than I would like. I'm going to make a further dose adjustment. He is instructed to decrease the Hydrea to 500 mg daily except for Mondays when he will take 1000 mg. Continue to monitor lab every 2 weeks until he is at steady state and then we can decrease frequency to monthly.

## 2014-06-27 NOTE — Telephone Encounter (Signed)
Per Dr. Lenna Gilford, ok for cxr.

## 2014-06-28 NOTE — Telephone Encounter (Signed)
Pt states that he already had his CXR done. Nothing further was needed.

## 2014-07-03 ENCOUNTER — Telehealth: Payer: Self-pay | Admitting: Critical Care Medicine

## 2014-07-03 ENCOUNTER — Ambulatory Visit (INDEPENDENT_AMBULATORY_CARE_PROVIDER_SITE_OTHER): Payer: Medicare Other | Admitting: Podiatry

## 2014-07-03 DIAGNOSIS — M79673 Pain in unspecified foot: Secondary | ICD-10-CM

## 2014-07-03 DIAGNOSIS — B351 Tinea unguium: Secondary | ICD-10-CM

## 2014-07-03 NOTE — Progress Notes (Signed)
Subjective:     Patient ID: Alexander [name], Dr., male   DOB: 10/19/1921, 81 y.o.   MRN: [id number]  HPI patient presents with nail disease 1-5 both feet that he cannot cut and they become painful   Review of Systems     Objective:   Physical Exam Neurovascular status intact with thick yellow brittle nailbeds 1-5 both feet that are painful    Assessment:     Mycotic nail infection with pain 1-5 both feet    Plan:     Debride painful nailbeds 1-5 both feet with no iatrogenic bleeding noted

## 2014-07-03 NOTE — Telephone Encounter (Signed)
I called spoke with pt. He did not want to see any other provider in the office/TP. He is just wanting to follow up to ensure he is on the right medication and no changes needed to be made. He is aware PW not in the office until Wednesday. He is asking if he can be worked in to see PW. Please advise thanks

## 2014-07-03 NOTE — Telephone Encounter (Signed)
Work him in as best as you can

## 2014-07-04 NOTE — Telephone Encounter (Signed)
lmomtcb for pt to see if he can come in tomorrow, Wed March 2 at 8:45.

## 2014-07-04 NOTE — Telephone Encounter (Signed)
[id number], pt cb

## 2014-07-04 NOTE — Telephone Encounter (Signed)
Can you please help with working this pt in? Thanks.

## 2014-07-04 NOTE — Telephone Encounter (Signed)
Pt scheduled to see [name] tomorrow at 10 am.  He confirmed appt and voiced no further questions or concerns at this time.

## 2014-07-05 ENCOUNTER — Ambulatory Visit (INDEPENDENT_AMBULATORY_CARE_PROVIDER_SITE_OTHER): Payer: Medicare Other | Admitting: Critical Care Medicine

## 2014-07-05 ENCOUNTER — Other Ambulatory Visit: Payer: Medicare Other

## 2014-07-05 VITALS — BP 134/66 | HR 63 | Temp 97.0°F | Ht 67.0 in | Wt 127.4 lb

## 2014-07-05 DIAGNOSIS — J0191 Acute recurrent sinusitis, unspecified: Secondary | ICD-10-CM

## 2014-07-05 DIAGNOSIS — J4540 Moderate persistent asthma, uncomplicated: Secondary | ICD-10-CM | POA: Diagnosis not present

## 2014-07-05 MED ORDER — FLUTTER DEVI: Status: DC

## 2014-07-05 MED ORDER — DOXYCYCLINE HYCLATE 100 MG PO CAPS: 100.0000 mg | ORAL_CAPSULE | Freq: Two times a day (BID) | ORAL | Status: DC

## 2014-07-05 MED ORDER — MOMETASONE FUROATE 200 MCG/ACT IN AERO: 2.0000 | INHALATION_SPRAY | Freq: Two times a day (BID) | RESPIRATORY_TRACT | Status: DC

## 2014-07-05 MED ORDER — FLUTICASONE PROPIONATE 50 MCG/ACT NA SUSP: 2.0000 | Freq: Every day | NASAL | Status: DC

## 2014-07-05 MED ORDER — PREDNISONE 10 MG PO TABS: ORAL_TABLET | ORAL | Status: DC

## 2014-07-05 NOTE — Patient Instructions (Addendum)
Asmanex two puff twice daily 200, use spacer, use samples, when run out go over to 100 strength two puff twice daily Prednisone 10mg  Take 4 for two days three for two days two for two days one for two days Take the doxycycline twice daily for 10 days and stop Flonase two puff ea nostril daily Saline rinse ea nostril 2-3 times per day Flutter valve 3-4 times daily Prevnar at next visit Return 1 month

## 2014-07-05 NOTE — Progress Notes (Signed)
Subjective:    Patient ID: Alexander [name], Dr., male    DOB: [birth year], 81 y.o.   MRN: [id number]  HPI     07/05/2014 Chief Complaint  Patient presents with  . Follow-up    Cough with clear mucus.  DOE.  No chest tightness or CP.  Pt noted since 06/13/14 URI, sore throat, minor cough, then worse cough.  2/23/216: Pt saw PCP: Productive clear mucus. Noted wheeze and dyspnea exertional.  Sleeping well.  Rattly cough,  Rhinorrhea and post nasal drip No fever/chills/sweats.  No chest pain.  CXR NEG.      Review of Systems  Constitutional: Negative for diaphoresis, activity change, fatigue and unexpected weight change.  HENT: Positive for congestion, hearing loss and voice change. Negative for dental problem, ear discharge, facial swelling, mouth sores, nosebleeds, sinus pressure and tinnitus.        No dysphagia No sore throat Is hoarse  Eyes: Positive for itching. Negative for photophobia, discharge and visual disturbance.  Respiratory: Negative for apnea, choking, chest tightness and stridor.   Cardiovascular: Negative for palpitations.  Gastrointestinal: Negative for nausea, constipation, blood in stool and abdominal distention.  Genitourinary: Negative for dysuria, urgency, frequency, hematuria, flank pain, decreased urine volume and difficulty urinating.  Musculoskeletal: Positive for gait problem. Negative for back pain, joint swelling, arthralgias and neck stiffness.       Balance issue   Skin: Negative for color change and pallor.  Neurological: Positive for weakness and numbness. Negative for dizziness, tremors, seizures, syncope, speech difficulty and light-headedness.  Hematological: Negative for adenopathy. Does not bruise/bleed easily.  Psychiatric/Behavioral: Negative for confusion, sleep disturbance and agitation. The patient is not nervous/anxious.        Objective:   Physical Exam Filed Vitals:   07/05/14 1021  BP: 134/66  Pulse: 63  Temp: 97 F (36.1 C)  TempSrc:  Oral  Height: 5\' 7"  (1.702 m)  Weight: 127 lb 6.4 oz (57.788 kg)  SpO2: 96%    Gen: Pleasant, well-nourished, in no distress,  normal affect  ENT: No lesions,  mouth clear,  oropharynx clear, ++ postnasal drip, nasal purulence  Neck: No JVD, no TMG, no carotid bruits  Lungs: No use of accessory muscles, no dullness to percussion,exp wheezes   Cardiovascular: RRR, heart sounds normal, no murmur or gallops, no peripheral edema  Abdomen: soft and NT, no HSM,  BS normal  Musculoskeletal: No deformities, no cyanosis or clubbing  Neuro: alert, non focal  Skin: Warm, no lesions or rashes      Assessment & Plan:   Moderate persistent asthma without complication Moderate persistent asthma with flare Mild sinusitis and tracheitis Plan Asmanex two puff twice daily 200, use spacer, use samples, when run out go over to 100 strength two puff twice daily Prednisone 10mg  Take 4 for two days three for two days two for two days one for two days Take the doxycycline twice daily for 10 days and stop Flonase two puff ea nostril daily Saline rinse ea nostril 2-3 times per day Flutter valve 3-4 times daily Prevnar at next visit Return 1 month      Updated Medication List Outpatient Encounter Prescriptions as of 07/05/2014  Medication Sig  . aluminum & magnesium hydroxide-simethicone (MYLANTA) 500-450-40 MG/5ML suspension Take by mouth every 6 (six) hours as needed. 200-200-20mg /35ml   . aspirin 81 MG tablet Take 81 mg by mouth daily.    . cholecalciferol (VITAMIN D) 1000 UNITS tablet Take 2,000 Units by mouth daily.   [name]  Coenzyme Q10 (CO Q-10) 300 MG CAPS Take 1 capsule by mouth daily.  . Cyanocobalamin (VITAMIN B-12 IJ) Inject as directed. monthly   . diphenhydrAMINE (BENADRYL) 25 MG tablet Take 25 mg by mouth at bedtime as needed.  . finasteride (PROSCAR) 5 MG tablet Take 1 tablet by mouth 3 (three) times a week. TIW  . fluticasone (FLONASE) 50 MCG/ACT nasal spray Place 2 sprays into  both nostrils daily.  . hydroxyurea (HYDREA) 500 MG capsule Take 1 capsule (500 mg total) by mouth daily. Change Hydrea to 1,000 mg on Mon only &  500 mg other days of the week May take with food to minimize GI side effects.  [name] losartan (COZAAR) 25 MG tablet Take 1 tablet (25 mg total) by mouth daily.  . LUTEIN PO Take 6 mg by mouth daily.   . metoprolol tartrate (LOPRESSOR) 25 MG tablet Take 1 tablet (25 mg total) by mouth 2 (two) times  daily.  . Multiple Vitamin (MULTIVITAMIN) tablet Take 1 tablet by mouth daily.    . Multiple Vitamins-Minerals (PRESERVISION AREDS PO) Take 1 capsule by mouth 2 (two) times daily.    . ranitidine (ZANTAC) 150 MG tablet Take 150 mg by mouth daily.  . rosuvastatin (CRESTOR) 10 MG tablet Take 1 tablet (10 mg total) by mouth daily. (Patient taking differently: Take 10 mg by mouth 3 (three) times a week. )  . [DISCONTINUED] fluticasone (FLONASE) 50 MCG/ACT nasal spray Place 1 spray into the nose 2 (two) times daily.   . [DISCONTINUED] Mometasone Furoate 100 MCG/ACT AERO Inhale 1 puff into the lungs 2 (two) times daily.  [name] doxycycline (VIBRAMYCIN) 100 MG capsule Take 1 capsule (100 mg total) by mouth 2 (two) times daily. Till gone  . Mometasone Furoate (ASMANEX HFA) 200 MCG/ACT AERO Inhale 2 puffs into the lungs 2 (two) times daily.  [name] NITROSTAT 0.4 MG SL tablet as directed.  . predniSONE (DELTASONE) 10 MG tablet Take 4 for two days three for two days two for two days one for two days  . Respiratory Therapy Supplies (FLUTTER) DEVI Use 3-4 times daily  . triazolam (HALCION) 0.125 MG tablet Take 1 tablet (0.125 mg total) by mouth at bedtime as needed for sleep. (Patient not taking: Reported on 07/05/2014)  . [DISCONTINUED] Melatonin 3 MG TABS Take 1 tablet by mouth Nightly.  . [DISCONTINUED] Mometasone Furoate (ASMANEX HFA) 200 MCG/ACT AERO Inhale 2 puffs into the lungs 2 (two) times daily.

## 2014-07-06 ENCOUNTER — Encounter: Payer: Self-pay | Admitting: Critical Care Medicine

## 2014-07-06 NOTE — Assessment & Plan Note (Signed)
Moderate persistent asthma with flare Mild sinusitis and tracheitis Plan Asmanex two puff twice daily 200, use spacer, use samples, when run out go over to 100 strength two puff twice daily Prednisone 10mg  Take 4 for two days three for two days two for two days one for two days Take the doxycycline twice daily for 10 days and stop Flonase two puff ea nostril daily Saline rinse ea nostril 2-3 times per day Flutter valve 3-4 times daily Prevnar at next visit Return 1 month

## 2014-07-07 NOTE — Telephone Encounter (Signed)
PW do you know what Dr. Antony Haste is referring to? The [facility] [facility] sheet?

## 2014-07-14 DIAGNOSIS — N401 Enlarged prostate with lower urinary tract symptoms: Secondary | ICD-10-CM | POA: Diagnosis not present

## 2014-07-14 DIAGNOSIS — N289 Disorder of kidney and ureter, unspecified: Secondary | ICD-10-CM | POA: Diagnosis not present

## 2014-07-14 DIAGNOSIS — N138 Other obstructive and reflux uropathy: Secondary | ICD-10-CM | POA: Diagnosis not present

## 2014-07-24 ENCOUNTER — Encounter: Payer: Self-pay | Admitting: Oncology

## 2014-07-31 ENCOUNTER — Other Ambulatory Visit: Payer: Medicare Other

## 2014-08-02 ENCOUNTER — Ambulatory Visit (INDEPENDENT_AMBULATORY_CARE_PROVIDER_SITE_OTHER): Payer: Medicare Other | Admitting: Critical Care Medicine

## 2014-08-02 ENCOUNTER — Encounter: Payer: Self-pay | Admitting: Critical Care Medicine

## 2014-08-02 VITALS — BP 120/72 | HR 52 | Temp 97.4°F | Ht 66.0 in | Wt 126.6 lb

## 2014-08-02 DIAGNOSIS — J4540 Moderate persistent asthma, uncomplicated: Secondary | ICD-10-CM

## 2014-08-02 DIAGNOSIS — J0191 Acute recurrent sinusitis, unspecified: Secondary | ICD-10-CM | POA: Diagnosis not present

## 2014-08-02 MED ORDER — MOMETASONE FUROATE 100 MCG/ACT IN AERO: 2.0000 | INHALATION_SPRAY | Freq: Every day | RESPIRATORY_TRACT | Status: DC

## 2014-08-02 NOTE — Patient Instructions (Signed)
Reduce to Asmanex 100 two puff daily Continue neil med sinus rinse Return 4 months

## 2014-08-02 NOTE — Progress Notes (Signed)
Subjective:    Patient ID: Alexander [name], Dr., male    DOB: 1921/10/03, 81 y.o.   MRN: [id number]  HPI     08/02/2014 Chief Complaint  Patient presents with  . Follow-up    Pt states he hs SOB with excertion. He states cough has gone and denies any tightness, wheezing, n/v/d.  Pt is better, only on exertion.  Cough is gone .  Doing much better.   Pt denies any significant sore throat, nasal congestion or excess secretions, fever, chills, sweats, unintended weight loss, pleurtic or exertional chest pain, orthopnea PND, or leg swelling Pt denies any increase in rescue therapy over baseline, denies waking up needing it or having any early am or nocturnal exacerbations of coughing/wheezing/or dyspnea. Pt also denies any obvious fluctuation in symptoms with  weather or environmental change or other alleviating or aggravating factors  PUL ASTHMA HISTORY 08/03/2014 03/02/2013  Symptoms 0-2 days/week 0-2 days/week  Nighttime awakenings 0-2/month 0-2/month  Interference with activity No limitations No limitations  SABA use 0-2 days/wk 0-2 days/wk  Exacerbations requiring oral steroids 0-1 / year 0-1 / year      Review of Systems  Constitutional: Negative for diaphoresis, activity change, fatigue and unexpected weight change.  HENT: Positive for hearing loss. Negative for congestion, dental problem, ear discharge, facial swelling, mouth sores, nosebleeds, sinus pressure, tinnitus and voice change.        No dysphagia No sore throat Is hoarse  Eyes: Negative for photophobia, discharge, itching and visual disturbance.  Respiratory: Negative for apnea, choking, chest tightness and stridor.   Cardiovascular: Negative for palpitations.  Gastrointestinal: Negative for nausea, constipation, blood in stool and abdominal distention.  Genitourinary: Negative for dysuria, urgency, frequency, hematuria, flank pain, decreased urine volume and difficulty urinating.  Musculoskeletal: Positive for gait  problem. Negative for back pain, joint swelling, arthralgias and neck stiffness.       Balance issue   Skin: Negative for color change and pallor.  Neurological: Positive for weakness and numbness. Negative for dizziness, tremors, seizures, syncope, speech difficulty and light-headedness.  Hematological: Negative for adenopathy. Does not bruise/bleed easily.  Psychiatric/Behavioral: Negative for confusion, sleep disturbance and agitation. The patient is not nervous/anxious.        Objective:   Physical Exam Filed Vitals:   08/02/14 1014 08/02/14 1023  BP: 120/72   Pulse:  52  Temp: 97.4 F (36.3 C)   TempSrc: Oral   Height: 5\' 6"  (1.676 m)   Weight: 126 lb 9.6 oz (57.425 kg)   SpO2:  98%    Gen: Pleasant, well-nourished, in no distress,  normal affect  ENT: No lesions,  mouth clear,  oropharynx clear,purulence resolved  Neck: No JVD, no TMG, no carotid bruits  Lungs: No use of accessory muscles, no dullness to percussion,no wheezes   Cardiovascular: RRR, heart sounds normal, no murmur or gallops, no peripheral edema  Abdomen: soft and NT, no HSM,  BS normal  Musculoskeletal: No deformities, no cyanosis or clubbing  Neuro: alert, non focal  Skin: Warm, no lesions or rashes      Assessment & Plan:   Moderate persistent asthma without complication Mod persistent asthma with recent flare d/t sinusitis, now resolved sinusitis and stable asthma Plan Reduce to Asmanex 100 two puff daily Continue neil med sinus rinse Return 4 months      Updated Medication List Outpatient Encounter Prescriptions as of 08/02/2014  Medication Sig  . aluminum & magnesium hydroxide-simethicone (MYLANTA) 500-450-40 MG/5ML suspension Take by mouth  every 6 (six) hours as needed. 200-200-20mg /45ml   . aspirin 81 MG tablet Take 81 mg by mouth daily.    . cholecalciferol (VITAMIN D) 1000 UNITS tablet Take 2,000 Units by mouth daily.   . Coenzyme Q10 (CO Q-10) 300 MG CAPS Take 1 capsule by  mouth daily.  . Cyanocobalamin (VITAMIN B-12 IJ) Inject as directed. monthly   . diphenhydrAMINE (BENADRYL) 25 MG tablet Take 25 mg by mouth at bedtime as needed.  . finasteride (PROSCAR) 5 MG tablet Take 1 tablet by mouth 3 (three) times a week. TIW  . fluticasone (FLONASE) 50 MCG/ACT nasal spray Place 2 sprays into both nostrils daily. (Patient taking differently: Place 2 sprays into both nostrils as needed. )  . hydroxyurea (HYDREA) 500 MG capsule Take 1 capsule (500 mg total) by mouth daily. Change Hydrea to 1,000 mg on Mon only &  500 mg other days of the week May take with food to minimize GI side effects.  [name] losartan (COZAAR) 25 MG tablet Take 1 tablet (25 mg total) by mouth daily.  . LUTEIN PO Take 6 mg by mouth daily.   . metoprolol tartrate (LOPRESSOR) 25 MG tablet Take 1 tablet (25 mg total) by mouth 2 (two) times  daily.  . Mometasone Furoate (ASMANEX HFA) 100 MCG/ACT AERO Inhale 2 puffs into the lungs daily.  . Multiple Vitamin (MULTIVITAMIN) tablet Take 1 tablet by mouth daily.    . Multiple Vitamins-Minerals (PRESERVISION AREDS PO) Take 1 capsule by mouth 2 (two) times daily.    . ranitidine (ZANTAC) 150 MG tablet Take 150 mg by mouth daily.  . rosuvastatin (CRESTOR) 10 MG tablet Take 1 tablet (10 mg total) by mouth daily. (Patient taking differently: Take 10 mg by mouth 3 (three) times a week. )  . [DISCONTINUED] Mometasone Furoate (ASMANEX HFA) 100 MCG/ACT AERO Inhale 2 puffs into the lungs daily.  [name] NITROSTAT 0.4 MG SL tablet as directed.  [name] Respiratory Therapy Supplies (FLUTTER) DEVI Use 3-4 times daily (Patient not taking: Reported on 08/02/2014)  . [DISCONTINUED] doxycycline (VIBRAMYCIN) 100 MG capsule Take 1 capsule (100 mg total) by mouth 2 (two) times daily. Till gone (Patient not taking: Reported on 08/02/2014)  . [DISCONTINUED] Mometasone Furoate (ASMANEX HFA) 200 MCG/ACT AERO Inhale 2 puffs into the lungs 2 (two) times daily. (Patient not taking: Reported on 08/02/2014)  .  [DISCONTINUED] predniSONE (DELTASONE) 10 MG tablet Take 4 for two days three for two days two for two days one for two days (Patient not taking: Reported on 08/02/2014)  . [DISCONTINUED] triazolam (HALCION) 0.125 MG tablet Take 1 tablet (0.125 mg total) by mouth at bedtime as needed for sleep. (Patient not taking: Reported on 07/05/2014)

## 2014-08-03 NOTE — Assessment & Plan Note (Signed)
Mod persistent asthma with recent flare d/t sinusitis, now resolved sinusitis and stable asthma Plan Reduce to Asmanex 100 two puff daily Continue neil med sinus rinse Return 4 months

## 2014-08-07 ENCOUNTER — Other Ambulatory Visit (INDEPENDENT_AMBULATORY_CARE_PROVIDER_SITE_OTHER): Payer: Medicare Other

## 2014-08-07 DIAGNOSIS — D473 Essential (hemorrhagic) thrombocythemia: Secondary | ICD-10-CM | POA: Diagnosis not present

## 2014-08-07 DIAGNOSIS — N183 Chronic kidney disease, stage 3 unspecified: Secondary | ICD-10-CM

## 2014-08-07 DIAGNOSIS — D693 Immune thrombocytopenic purpura: Secondary | ICD-10-CM

## 2014-08-07 LAB — CBC WITH DIFFERENTIAL/PLATELET
BASOS ABS: 0 10*3/uL (ref 0.0–0.1)
BASOS PCT: 1 % (ref 0–1)
EOS PCT: 3 % (ref 0–5)
Eosinophils Absolute: 0.1 10*3/uL (ref 0.0–0.7)
HCT: 32.4 % — ABNORMAL LOW (ref 39.0–52.0)
Hemoglobin: 10.9 g/dL — ABNORMAL LOW (ref 13.0–17.0)
Lymphocytes Relative: 37 % (ref 12–46)
Lymphs Abs: 1.3 10*3/uL (ref 0.7–4.0)
MCH: 41.3 pg — AB (ref 26.0–34.0)
MCHC: 33.6 g/dL (ref 30.0–36.0)
MCV: 122.7 fL — AB (ref 78.0–100.0)
MPV: 9.7 fL (ref 8.6–12.4)
Monocytes Absolute: 0.5 10*3/uL (ref 0.1–1.0)
Monocytes Relative: 13 % — ABNORMAL HIGH (ref 3–12)
NEUTROS ABS: 1.6 10*3/uL — AB (ref 1.7–7.7)
Neutrophils Relative %: 46 % (ref 43–77)
PLATELETS: 189 10*3/uL (ref 150–400)
RBC: 2.64 MIL/uL — AB (ref 4.22–5.81)
RDW: 13.4 % (ref 11.5–15.5)
WBC: 3.5 10*3/uL — AB (ref 4.0–10.5)

## 2014-08-07 LAB — RENAL FUNCTION PANEL
Albumin: 3.7 g/dL (ref 3.5–5.2)
Anion gap: 8 (ref 5–15)
BUN: 33 mg/dL — AB (ref 6–23)
CALCIUM: 9.2 mg/dL (ref 8.4–10.5)
CO2: 24 mmol/L (ref 19–32)
Chloride: 109 mmol/L (ref 96–112)
Creatinine, Ser: 1.88 mg/dL — ABNORMAL HIGH (ref 0.50–1.35)
GFR calc Af Amer: 35 mL/min — ABNORMAL LOW (ref >90)
GFR calc non Af Amer: 30 mL/min — ABNORMAL LOW (ref >90)
GLUCOSE: 103 mg/dL — AB (ref 70–99)
Phosphorus: 3.5 mg/dL (ref 2.3–4.6)
Potassium: 4 mmol/L (ref 3.5–5.1)
Sodium: 141 mmol/L (ref 135–145)

## 2014-08-08 ENCOUNTER — Telehealth: Payer: Self-pay | Admitting: *Deleted

## 2014-08-08 LAB — PTH, INTACT AND CALCIUM
Calcium, Total (PTH): 8.9 mg/dL (ref 8.6–10.2)
PTH: 34 pg/mL (ref 15–65)

## 2014-08-08 NOTE — Telephone Encounter (Signed)
Pt called / informed of test results from yesterday and to stay on current dose of Hydrea per Dr [name]. No questions.

## 2014-08-08 NOTE — Telephone Encounter (Signed)
-----   Message from Annia Belt, MD sent at 08/07/2014  8:39 PM EDT ----- Call pt with results. Stay on current dose of hydrea

## 2014-08-09 ENCOUNTER — Other Ambulatory Visit: Payer: Medicare Other

## 2014-08-14 DIAGNOSIS — L821 Other seborrheic keratosis: Secondary | ICD-10-CM | POA: Diagnosis not present

## 2014-08-16 DIAGNOSIS — I10 Essential (primary) hypertension: Secondary | ICD-10-CM | POA: Diagnosis not present

## 2014-08-16 DIAGNOSIS — I2510 Atherosclerotic heart disease of native coronary artery without angina pectoris: Secondary | ICD-10-CM | POA: Diagnosis not present

## 2014-08-16 DIAGNOSIS — N183 Chronic kidney disease, stage 3 (moderate): Secondary | ICD-10-CM | POA: Diagnosis not present

## 2014-08-22 DIAGNOSIS — H3531 Nonexudative age-related macular degeneration: Secondary | ICD-10-CM | POA: Diagnosis not present

## 2014-08-22 DIAGNOSIS — H2511 Age-related nuclear cataract, right eye: Secondary | ICD-10-CM | POA: Diagnosis not present

## 2014-08-22 DIAGNOSIS — Z961 Presence of intraocular lens: Secondary | ICD-10-CM | POA: Diagnosis not present

## 2014-08-22 DIAGNOSIS — H3532 Exudative age-related macular degeneration: Secondary | ICD-10-CM | POA: Diagnosis not present

## 2014-08-28 ENCOUNTER — Other Ambulatory Visit (INDEPENDENT_AMBULATORY_CARE_PROVIDER_SITE_OTHER): Payer: Medicare Other

## 2014-08-28 DIAGNOSIS — N183 Chronic kidney disease, stage 3 unspecified: Secondary | ICD-10-CM

## 2014-08-28 DIAGNOSIS — D473 Essential (hemorrhagic) thrombocythemia: Secondary | ICD-10-CM

## 2014-08-28 LAB — CBC WITH DIFFERENTIAL/PLATELET
Basophils Absolute: 0.1 10*3/uL (ref 0.0–0.1)
Basophils Relative: 1 % (ref 0–1)
EOS PCT: 4 % (ref 0–5)
Eosinophils Absolute: 0.2 10*3/uL (ref 0.0–0.7)
HCT: 33.4 % — ABNORMAL LOW (ref 39.0–52.0)
Hemoglobin: 11.4 g/dL — ABNORMAL LOW (ref 13.0–17.0)
Lymphocytes Relative: 32 % (ref 12–46)
Lymphs Abs: 1.7 10*3/uL (ref 0.7–4.0)
MCH: 40.7 pg — AB (ref 26.0–34.0)
MCHC: 34.1 g/dL (ref 30.0–36.0)
MCV: 119.3 fL — ABNORMAL HIGH (ref 78.0–100.0)
MPV: 9.8 fL (ref 8.6–12.4)
Monocytes Absolute: 0.6 10*3/uL (ref 0.1–1.0)
Monocytes Relative: 12 % (ref 3–12)
Neutro Abs: 2.7 10*3/uL (ref 1.7–7.7)
Neutrophils Relative %: 51 % (ref 43–77)
Platelets: 144 10*3/uL — ABNORMAL LOW (ref 150–400)
RBC: 2.8 MIL/uL — ABNORMAL LOW (ref 4.22–5.81)
RDW: 13.2 % (ref 11.5–15.5)
WBC: 5.2 10*3/uL (ref 4.0–10.5)

## 2014-08-28 LAB — POCT URINALYSIS DIPSTICK
BILIRUBIN UA: NEGATIVE
Bilirubin, UA: NEGATIVE
Blood, UA: NEGATIVE
GLUCOSE UA: NEGATIVE
Glucose, UA: NEGATIVE
KETONES UA: NEGATIVE
Ketones, UA: NEGATIVE
LEUKOCYTES UA: NEGATIVE
Leukocytes, UA: NEGATIVE
NITRITE UA: NEGATIVE
Nitrite, UA: NEGATIVE
Protein, UA: NEGATIVE
Protein, UA: NEGATIVE
RBC UA: NEGATIVE
SPEC GRAV UA: 1.02
Spec Grav, UA: 1.015
UROBILINOGEN UA: 0.2
Urobilinogen, UA: 0.2
pH, UA: 5
pH, UA: 5.5

## 2014-08-30 ENCOUNTER — Telehealth: Payer: Self-pay | Admitting: *Deleted

## 2014-08-30 NOTE — Telephone Encounter (Signed)
-----   Message from Annia Belt, MD sent at 08/29/2014  1:09 PM EDT ----- Call pt with lab results.  We can decrease Hydrea to 500 mg daily; no need to take 1,000 mg on Mondays, just 500 mg. Repeat CBC 1 month. Please record change on med sheet

## 2014-08-30 NOTE — Telephone Encounter (Signed)
Pt called / informed of lab results and to decrease Hydrea to 500 mg daily (including Monday) per Dr [name]. Pt repeated /voiced understanding. He has a lab appt already scheduled for 5//24. I will mail lab results to him per his request.

## 2014-09-06 ENCOUNTER — Other Ambulatory Visit: Payer: Medicare Other

## 2014-09-11 ENCOUNTER — Ambulatory Visit: Payer: Medicare Other

## 2014-09-14 ENCOUNTER — Ambulatory Visit: Payer: Medicare Other

## 2014-09-15 ENCOUNTER — Encounter: Payer: Self-pay | Admitting: *Deleted

## 2014-09-25 ENCOUNTER — Other Ambulatory Visit: Payer: Medicare Other

## 2014-09-26 ENCOUNTER — Other Ambulatory Visit (INDEPENDENT_AMBULATORY_CARE_PROVIDER_SITE_OTHER): Payer: Medicare Other

## 2014-09-26 DIAGNOSIS — N183 Chronic kidney disease, stage 3 unspecified: Secondary | ICD-10-CM

## 2014-09-26 DIAGNOSIS — D473 Essential (hemorrhagic) thrombocythemia: Secondary | ICD-10-CM | POA: Diagnosis not present

## 2014-09-26 LAB — CBC WITH DIFFERENTIAL/PLATELET
Basophils Absolute: 0 10*3/uL (ref 0.0–0.1)
Basophils Relative: 0 % (ref 0–1)
EOS ABS: 0.2 10*3/uL (ref 0.0–0.7)
Eosinophils Relative: 3 % (ref 0–5)
HCT: 34.6 % — ABNORMAL LOW (ref 39.0–52.0)
HEMOGLOBIN: 11.5 g/dL — AB (ref 13.0–17.0)
LYMPHS ABS: 1.7 10*3/uL (ref 0.7–4.0)
Lymphocytes Relative: 30 % (ref 12–46)
MCH: 39.2 pg — ABNORMAL HIGH (ref 26.0–34.0)
MCHC: 33.2 g/dL (ref 30.0–36.0)
MCV: 118.1 fL — AB (ref 78.0–100.0)
MONO ABS: 0.6 10*3/uL (ref 0.1–1.0)
MONOS PCT: 11 % (ref 3–12)
MPV: 9.7 fL (ref 8.6–12.4)
Neutro Abs: 3.1 10*3/uL (ref 1.7–7.7)
Neutrophils Relative %: 56 % (ref 43–77)
PLATELETS: 151 10*3/uL (ref 150–400)
RBC: 2.93 MIL/uL — ABNORMAL LOW (ref 4.22–5.81)
RDW: 13.3 % (ref 11.5–15.5)
WBC: 5.6 10*3/uL (ref 4.0–10.5)

## 2014-09-27 ENCOUNTER — Other Ambulatory Visit: Payer: Self-pay | Admitting: Oncology

## 2014-09-27 ENCOUNTER — Telehealth: Payer: Self-pay | Admitting: *Deleted

## 2014-09-27 DIAGNOSIS — D473 Essential (hemorrhagic) thrombocythemia: Secondary | ICD-10-CM

## 2014-09-27 DIAGNOSIS — D75839 Thrombocytosis, unspecified: Secondary | ICD-10-CM

## 2014-09-27 NOTE — Telephone Encounter (Signed)
Pt called / informed labs are stable; "where we want to be; and to stay on current dose of Hydrea"; and to stay on monthly CBC per Dr [name]. He asked if I would mail his labs to him; I will do so today.

## 2014-09-27 NOTE — Telephone Encounter (Signed)
-----   Message from Annia Belt, MD sent at 09/27/2014  8:44 AM EDT ----- Call pt w lab results: we are where we want to be. Stay on current dose of Hydrea. We can go to monthly CBCs

## 2014-09-28 ENCOUNTER — Ambulatory Visit (INDEPENDENT_AMBULATORY_CARE_PROVIDER_SITE_OTHER): Payer: Medicare Other

## 2014-09-28 DIAGNOSIS — M79673 Pain in unspecified foot: Secondary | ICD-10-CM

## 2014-09-28 DIAGNOSIS — B351 Tinea unguium: Secondary | ICD-10-CM

## 2014-09-28 NOTE — Progress Notes (Signed)
Patient ID: Alexander [name], Dr., male   DOB: [birth year], 81 y.o.   MRN: [id number] Complaint:  Visit Type: Patient returns to my office for continued preventative foot care services. Complaint: Patient states" my nails have grown long and thick and become painful to walk and wear shoes" . He presents for preventative foot care services. No changes to ROS  Podiatric Exam: Vascular: dorsalis pedis and posterior tibial pulses are palpable bilateral. Capillary return is immediate. Temperature gradient is WNL. Skin turgor WNL  Sensorium: Normal Semmes Weinstein monofilament test. Normal tactile sensation bilaterally. Nail Exam: Pt has thick disfigured discolored nails with subungual debris noted bilateral entire nail hallux through fifth toenails Ulcer Exam: There is no evidence of ulcer or pre-ulcerative changes or infection. Orthopedic Exam: Muscle tone and strength are WNL. No limitations in general ROM. No crepitus or effusions noted. Foot type and digits show no abnormalities. Bony prominences are unremarkable. Skin: No Porokeratosis. No infection or ulcers  Diagnosis:  Tinea unguium, Pain in right toe, pain in left toes  Treatment & Plan Procedures and Treatment: Consent by patient was obtained for treatment procedures. The patient understood the discussion of treatment and procedures well. All questions were answered thoroughly reviewed. Debridement of mycotic and hypertrophic toenails, 1 through 5 bilateral and clearing of subungual debris. No ulceration, no infection noted.  Return Visit-Office Procedure: Patient instructed to return to the office for a follow up visit 3 months for continued evaluation and treatment.

## 2014-10-04 ENCOUNTER — Other Ambulatory Visit: Payer: Medicare Other

## 2014-10-17 ENCOUNTER — Other Ambulatory Visit: Payer: Self-pay | Admitting: *Deleted

## 2014-10-17 MED ORDER — HYDROXYUREA 500 MG PO CAPS: 500.0000 mg | ORAL_CAPSULE | Freq: Every day | ORAL | Status: DC

## 2014-10-24 ENCOUNTER — Other Ambulatory Visit: Payer: Medicare Other

## 2014-10-24 DIAGNOSIS — Z125 Encounter for screening for malignant neoplasm of prostate: Secondary | ICD-10-CM | POA: Diagnosis not present

## 2014-10-24 DIAGNOSIS — E538 Deficiency of other specified B group vitamins: Secondary | ICD-10-CM | POA: Diagnosis not present

## 2014-10-24 DIAGNOSIS — I2510 Atherosclerotic heart disease of native coronary artery without angina pectoris: Secondary | ICD-10-CM | POA: Diagnosis not present

## 2014-10-24 DIAGNOSIS — R7301 Impaired fasting glucose: Secondary | ICD-10-CM | POA: Diagnosis not present

## 2014-10-24 DIAGNOSIS — E785 Hyperlipidemia, unspecified: Secondary | ICD-10-CM | POA: Diagnosis not present

## 2014-10-24 DIAGNOSIS — M810 Age-related osteoporosis without current pathological fracture: Secondary | ICD-10-CM | POA: Diagnosis not present

## 2014-10-24 DIAGNOSIS — R8299 Other abnormal findings in urine: Secondary | ICD-10-CM | POA: Diagnosis not present

## 2014-10-30 ENCOUNTER — Other Ambulatory Visit: Payer: Self-pay

## 2014-10-30 DIAGNOSIS — L821 Other seborrheic keratosis: Secondary | ICD-10-CM | POA: Diagnosis not present

## 2014-10-31 DIAGNOSIS — Z1389 Encounter for screening for other disorder: Secondary | ICD-10-CM | POA: Diagnosis not present

## 2014-10-31 DIAGNOSIS — D519 Vitamin B12 deficiency anemia, unspecified: Secondary | ICD-10-CM | POA: Diagnosis not present

## 2014-10-31 DIAGNOSIS — I2510 Atherosclerotic heart disease of native coronary artery without angina pectoris: Secondary | ICD-10-CM | POA: Diagnosis not present

## 2014-10-31 DIAGNOSIS — Q631 Lobulated, fused and horseshoe kidney: Secondary | ICD-10-CM | POA: Diagnosis not present

## 2014-10-31 DIAGNOSIS — J45909 Unspecified asthma, uncomplicated: Secondary | ICD-10-CM | POA: Diagnosis not present

## 2014-10-31 DIAGNOSIS — N183 Chronic kidney disease, stage 3 (moderate): Secondary | ICD-10-CM | POA: Diagnosis not present

## 2014-10-31 DIAGNOSIS — Z0000 Encounter for general adult medical examination without abnormal findings: Secondary | ICD-10-CM | POA: Diagnosis not present

## 2014-10-31 DIAGNOSIS — Z6822 Body mass index (BMI) 22.0-22.9, adult: Secondary | ICD-10-CM | POA: Diagnosis not present

## 2014-10-31 DIAGNOSIS — K4090 Unilateral inguinal hernia, without obstruction or gangrene, not specified as recurrent: Secondary | ICD-10-CM | POA: Diagnosis not present

## 2014-10-31 DIAGNOSIS — D473 Essential (hemorrhagic) thrombocythemia: Secondary | ICD-10-CM | POA: Diagnosis not present

## 2014-10-31 DIAGNOSIS — M5431 Sciatica, right side: Secondary | ICD-10-CM | POA: Diagnosis not present

## 2014-10-31 DIAGNOSIS — M810 Age-related osteoporosis without current pathological fracture: Secondary | ICD-10-CM | POA: Diagnosis not present

## 2014-11-21 ENCOUNTER — Other Ambulatory Visit: Payer: Medicare Other

## 2014-11-21 DIAGNOSIS — Z961 Presence of intraocular lens: Secondary | ICD-10-CM | POA: Diagnosis not present

## 2014-11-21 DIAGNOSIS — H2511 Age-related nuclear cataract, right eye: Secondary | ICD-10-CM | POA: Diagnosis not present

## 2014-11-21 DIAGNOSIS — H3532 Exudative age-related macular degeneration: Secondary | ICD-10-CM | POA: Diagnosis not present

## 2014-11-21 DIAGNOSIS — H3531 Nonexudative age-related macular degeneration: Secondary | ICD-10-CM | POA: Diagnosis not present

## 2014-11-22 ENCOUNTER — Other Ambulatory Visit (INDEPENDENT_AMBULATORY_CARE_PROVIDER_SITE_OTHER): Payer: Medicare Other

## 2014-11-22 DIAGNOSIS — D473 Essential (hemorrhagic) thrombocythemia: Secondary | ICD-10-CM | POA: Diagnosis not present

## 2014-11-22 DIAGNOSIS — D75839 Thrombocytosis, unspecified: Secondary | ICD-10-CM

## 2014-11-22 LAB — CBC WITH DIFFERENTIAL/PLATELET
BASOS ABS: 0.1 10*3/uL (ref 0.0–0.1)
Basophils Relative: 1 % (ref 0–1)
EOS PCT: 3 % (ref 0–5)
Eosinophils Absolute: 0.2 10*3/uL (ref 0.0–0.7)
HCT: 37 % — ABNORMAL LOW (ref 39.0–52.0)
HEMOGLOBIN: 12.5 g/dL — AB (ref 13.0–17.0)
Lymphocytes Relative: 34 % (ref 12–46)
Lymphs Abs: 1.7 10*3/uL (ref 0.7–4.0)
MCH: 38.1 pg — AB (ref 26.0–34.0)
MCHC: 33.8 g/dL (ref 30.0–36.0)
MCV: 112.8 fL — ABNORMAL HIGH (ref 78.0–100.0)
MONO ABS: 0.6 10*3/uL (ref 0.1–1.0)
MONOS PCT: 11 % (ref 3–12)
MPV: 9.8 fL (ref 8.6–12.4)
NEUTROS ABS: 2.6 10*3/uL (ref 1.7–7.7)
NEUTROS PCT: 51 % (ref 43–77)
Platelets: 153 10*3/uL (ref 150–400)
RBC: 3.28 MIL/uL — AB (ref 4.22–5.81)
RDW: 13.9 % (ref 11.5–15.5)
WBC: 5.1 10*3/uL (ref 4.0–10.5)

## 2014-11-24 ENCOUNTER — Telehealth: Payer: Self-pay | Admitting: *Deleted

## 2014-11-24 NOTE — Telephone Encounter (Signed)
-----   Message from Annia Belt, MD sent at 11/23/2014  9:49 AM EDT ----- Call Dr [name]: counts excellent. Stay on same dose Hydrea. OK to go to every 2 month lab checks

## 2014-11-24 NOTE — Telephone Encounter (Signed)
Pt called - no answer; left message counts are excellent, stay on same dose of Hydrea and ok to go to every 2 month lab checks per Dr [name]. And to call if he has any questions; also I will mail results to pt. And message sent to front office about change to every 2 month labs.

## 2014-12-04 ENCOUNTER — Encounter: Payer: Self-pay | Admitting: Critical Care Medicine

## 2014-12-04 ENCOUNTER — Ambulatory Visit (INDEPENDENT_AMBULATORY_CARE_PROVIDER_SITE_OTHER): Payer: Medicare Other | Admitting: Critical Care Medicine

## 2014-12-04 VITALS — BP 92/52 | HR 57 | Ht 66.5 in | Wt 133.0 lb

## 2014-12-04 DIAGNOSIS — J4540 Moderate persistent asthma, uncomplicated: Secondary | ICD-10-CM | POA: Diagnosis not present

## 2014-12-04 DIAGNOSIS — Z23 Encounter for immunization: Secondary | ICD-10-CM

## 2014-12-04 MED ORDER — MOMETASONE FUROATE 100 MCG/ACT IN AERO: 1.0000 | INHALATION_SPRAY | RESPIRATORY_TRACT | Status: DC

## 2014-12-04 NOTE — Patient Instructions (Addendum)
Prevnar 13 vaccine was given Wean asmanex to off  Return 4 months with Dr Vaughan Browner

## 2014-12-04 NOTE — Progress Notes (Signed)
Subjective:    Patient ID: Alexander [name], Dr., male    DOB: July 01, 1921, 81 y.o.   MRN: [id number]  HPI 12/04/2014 Chief Complaint  Patient presents with  . Asthma    Breathing has been doing well. Does have some DOE at times. Denies chest tightness, coughing or wheezing.    Overall doing well, not wheezing.  No chest tightness Pt denies any significant sore throat, nasal congestion or excess secretions, fever, chills, sweats, unintended weight loss, pleurtic or exertional chest pain, orthopnea PND, or leg swelling Pt denies any increase in rescue therapy over baseline, denies waking up needing it or having any early am or nocturnal exacerbations of coughing/wheezing/or dyspnea. Pt also denies any obvious fluctuation in symptoms with  weather or environmental change or other alleviating or aggravating factors   Current Medications, Allergies, Complete Past Medical History, Past Surgical History, Family History, and Social History were reviewed in [facility] record per todays encounter:  12/04/2014  Review of Systems  Constitutional: Negative.   HENT: Negative.  Negative for ear pain, postnasal drip, rhinorrhea, sinus pressure, sore throat, trouble swallowing and voice change.   Eyes: Negative.   Respiratory: Negative.  Negative for apnea, cough, choking, chest tightness, shortness of breath, wheezing and stridor.   Cardiovascular: Negative.  Negative for chest pain, palpitations and leg swelling.  Gastrointestinal: Negative.  Negative for nausea, vomiting, abdominal pain and abdominal distention.  Genitourinary: Negative.   Musculoskeletal: Negative.  Negative for myalgias and arthralgias.  Skin: Negative.  Negative for rash.  Allergic/Immunologic: Negative.  Negative for environmental allergies and food allergies.  Neurological: Negative.  Negative for dizziness, syncope, weakness and headaches.  Hematological: Negative.  Negative for adenopathy. Does not  bruise/bleed easily.  Psychiatric/Behavioral: Negative.  Negative for sleep disturbance and agitation. The patient is not nervous/anxious.        Objective:   Physical Exam Filed Vitals:   12/04/14 0958 12/04/14 0959  BP:  92/52  Pulse:  57  Height: 5' 6.5" (1.689 m)   Weight: 133 lb (60.328 kg)   SpO2:  98%    Gen: Pleasant, well-nourished, in no distress,  normal affect  ENT: No lesions,  mouth clear,  oropharynx clear, no postnasal drip  Neck: No JVD, no TMG, no carotid bruits  Lungs: No use of accessory muscles, no dullness to percussion, clear without rales or rhonchi  Cardiovascular: RRR, heart sounds normal, no murmur or gallops, no peripheral edema  Abdomen: soft and NT, no HSM,  BS normal  Musculoskeletal: No deformities, no cyanosis or clubbing  Neuro: alert, non focal  Skin: Warm, no lesions or rashes  No results found.        Assessment & Plan:  I personally reviewed all images and lab data in the [facility] system as well as any outside material available during this office visit and agree with the  radiology impressions.   Moderate persistent asthma without complication Moderate persistent asthma with lower airway inflammation and airway obstruction improved at this time Plan The patient can wean inhaled steroid to off over the next several weeks and monitor his symptom complex Return 4 months   Tyrek was seen today for asthma.  Diagnoses and all orders for this visit:  Moderate persistent asthma without complication  Other orders -     Mometasone Furoate (ASMANEX HFA) 100 MCG/ACT AERO; Inhale 1 puff into the lungs every other day. For two weeks then stop -     Pneumococcal conjugate vaccine  13-valent IM (Prevnar)

## 2014-12-04 NOTE — Assessment & Plan Note (Signed)
Moderate persistent asthma with lower airway inflammation and airway obstruction improved at this time Plan The patient can wean inhaled steroid to off over the next several weeks and monitor his symptom complex Return 4 months

## 2014-12-11 DIAGNOSIS — L820 Inflamed seborrheic keratosis: Secondary | ICD-10-CM | POA: Diagnosis not present

## 2014-12-11 DIAGNOSIS — L821 Other seborrheic keratosis: Secondary | ICD-10-CM | POA: Diagnosis not present

## 2014-12-11 DIAGNOSIS — L570 Actinic keratosis: Secondary | ICD-10-CM | POA: Diagnosis not present

## 2014-12-19 ENCOUNTER — Other Ambulatory Visit: Payer: Medicare Other

## 2014-12-25 DIAGNOSIS — H2511 Age-related nuclear cataract, right eye: Secondary | ICD-10-CM | POA: Diagnosis not present

## 2014-12-27 ENCOUNTER — Other Ambulatory Visit (INDEPENDENT_AMBULATORY_CARE_PROVIDER_SITE_OTHER): Payer: Medicare Other

## 2014-12-27 DIAGNOSIS — D473 Essential (hemorrhagic) thrombocythemia: Secondary | ICD-10-CM

## 2014-12-27 DIAGNOSIS — D75839 Thrombocytosis, unspecified: Secondary | ICD-10-CM

## 2014-12-28 ENCOUNTER — Ambulatory Visit (INDEPENDENT_AMBULATORY_CARE_PROVIDER_SITE_OTHER): Payer: Medicare Other | Admitting: Podiatry

## 2014-12-28 ENCOUNTER — Encounter: Payer: Self-pay | Admitting: Podiatry

## 2014-12-28 ENCOUNTER — Telehealth: Payer: Self-pay | Admitting: *Deleted

## 2014-12-28 DIAGNOSIS — B351 Tinea unguium: Secondary | ICD-10-CM

## 2014-12-28 DIAGNOSIS — M79673 Pain in unspecified foot: Secondary | ICD-10-CM

## 2014-12-28 LAB — CBC WITH DIFFERENTIAL/PLATELET
BASOS ABS: 0 10*3/uL (ref 0.0–0.2)
Basos: 1 %
EOS (ABSOLUTE): 0.1 10*3/uL (ref 0.0–0.4)
EOS: 2 %
HEMATOCRIT: 34.9 % — AB (ref 37.5–51.0)
Hemoglobin: 11.9 g/dL — ABNORMAL LOW (ref 12.6–17.7)
IMMATURE GRANULOCYTES: 0 %
Immature Grans (Abs): 0 10*3/uL (ref 0.0–0.1)
Lymphocytes Absolute: 1.3 10*3/uL (ref 0.7–3.1)
Lymphs: 30 %
MCH: 37.8 pg — ABNORMAL HIGH (ref 26.6–33.0)
MCHC: 34.1 g/dL (ref 31.5–35.7)
MCV: 111 fL — ABNORMAL HIGH (ref 79–97)
MONOS ABS: 0.5 10*3/uL (ref 0.1–0.9)
Monocytes: 12 %
NEUTROS PCT: 55 %
Neutrophils Absolute: 2.4 10*3/uL (ref 1.4–7.0)
PLATELETS: 163 10*3/uL (ref 150–379)
RBC: 3.15 x10E6/uL — AB (ref 4.14–5.80)
RDW: 14.4 % (ref 12.3–15.4)
WBC: 4.4 10*3/uL (ref 3.4–10.8)

## 2014-12-28 NOTE — Telephone Encounter (Signed)
-----   Message from Annia Belt, MD sent at 12/28/2014  8:48 AM EDT ----- Call pt: counts stable. Stay on same dose of Hydrea.

## 2014-12-28 NOTE — Telephone Encounter (Signed)
Pt called / informed counts are stable and to continue current dose of Hydrea per Dr [name]. Pt voiced understanding. I will mail lab results, per pt's request, to him. Stated he will have labs done again next month then every 2 months.

## 2014-12-28 NOTE — Progress Notes (Signed)
Patient ID: Alexander [name], Dr., male   DOB: July 19, 1921, 81 y.o.   MRN: [id number] Complaint:  Visit Type: Patient returns to my office for continued preventative foot care services. Complaint: Patient states" my nails have grown long and thick and become painful to walk and wear shoes" . He presents for preventative foot care services. No changes to ROS  Podiatric Exam: Vascular: dorsalis pedis and posterior tibial pulses are palpable bilateral. Capillary return is immediate. Temperature gradient is WNL. Skin turgor WNL  Sensorium: Normal Semmes Weinstein monofilament test. Normal tactile sensation bilaterally. Nail Exam: Pt has thick disfigured discolored nails with subungual debris noted bilateral entire nail hallux through fifth toenails Ulcer Exam: There is no evidence of ulcer or pre-ulcerative changes or infection. Orthopedic Exam: Muscle tone and strength are WNL. No limitations in general ROM. No crepitus or effusions noted. Foot type and digits show no abnormalities. Bony prominences are unremarkable. Skin: No Porokeratosis. No infection or ulcers  Diagnosis:  Tinea unguium, Pain in right toe, pain in left toes  Treatment & Plan Procedures and Treatment: Consent by patient was obtained for treatment procedures. The patient understood the discussion of treatment and procedures well. All questions were answered thoroughly reviewed. Debridement of mycotic and hypertrophic toenails, 1 through 5 bilateral and clearing of subungual debris. No ulceration, no infection noted.  Return Visit-Office Procedure: Patient instructed to return to the office for a follow up visit 3 months for continued evaluation and treatment.

## 2015-01-15 ENCOUNTER — Telehealth: Payer: Self-pay | Admitting: Cardiovascular Disease

## 2015-01-15 NOTE — Telephone Encounter (Signed)
Pt wanted to talk to Mariann Laster about an appt.She wan not here,gave him a Marketing executive.

## 2015-01-22 ENCOUNTER — Telehealth: Payer: Self-pay | Admitting: Cardiovascular Disease

## 2015-01-22 MED ORDER — METOPROLOL TARTRATE 25 MG PO TABS: ORAL_TABLET | ORAL | Status: DC

## 2015-01-22 NOTE — Telephone Encounter (Signed)
Rx(s) sent to pharmacy electronically. Patient notified. 

## 2015-01-22 NOTE — Telephone Encounter (Signed)
°  1. Which medications need to be refilled?  Metoprolol-Please call in today,going out of town tomorrow  2. Which pharmacy is medication to be sent to? [phone]  3. Do they need a 30 day or 90 day supply? 30  4. Would they like a call back once the medication has been sent to the pharmacy? yes

## 2015-01-23 ENCOUNTER — Other Ambulatory Visit: Payer: Medicare Other

## 2015-02-03 DIAGNOSIS — Z23 Encounter for immunization: Secondary | ICD-10-CM | POA: Diagnosis not present

## 2015-02-06 ENCOUNTER — Ambulatory Visit (INDEPENDENT_AMBULATORY_CARE_PROVIDER_SITE_OTHER): Payer: Medicare Other | Admitting: Cardiovascular Disease

## 2015-02-06 ENCOUNTER — Encounter: Payer: Self-pay | Admitting: Cardiovascular Disease

## 2015-02-06 VITALS — BP 124/56 | HR 54 | Ht 67.0 in | Wt 132.3 lb

## 2015-02-06 DIAGNOSIS — D473 Essential (hemorrhagic) thrombocythemia: Secondary | ICD-10-CM | POA: Diagnosis not present

## 2015-02-06 DIAGNOSIS — N183 Chronic kidney disease, stage 3 unspecified: Secondary | ICD-10-CM

## 2015-02-06 DIAGNOSIS — I351 Nonrheumatic aortic (valve) insufficiency: Secondary | ICD-10-CM

## 2015-02-06 DIAGNOSIS — D75839 Thrombocytosis, unspecified: Secondary | ICD-10-CM

## 2015-02-06 DIAGNOSIS — I25700 Atherosclerosis of coronary artery bypass graft(s), unspecified, with unstable angina pectoris: Secondary | ICD-10-CM

## 2015-02-06 DIAGNOSIS — K219 Gastro-esophageal reflux disease without esophagitis: Secondary | ICD-10-CM | POA: Diagnosis not present

## 2015-02-06 MED ORDER — METOPROLOL TARTRATE 25 MG PO TABS: ORAL_TABLET | ORAL | Status: DC

## 2015-02-06 MED ORDER — ZOLPIDEM TARTRATE 10 MG PO TABS: 10.0000 mg | ORAL_TABLET | Freq: Every evening | ORAL | Status: DC | PRN

## 2015-02-06 MED ORDER — LOSARTAN POTASSIUM 25 MG PO TABS: ORAL_TABLET | ORAL | Status: DC

## 2015-02-06 NOTE — Patient Instructions (Signed)
Your physician wants you to follow-up in: 6 months or sooner if needed. You will receive a reminder letter in the mail two months in advance. If you don't receive a letter, please call our office to schedule the follow-up appointment. 

## 2015-02-06 NOTE — Progress Notes (Signed)
Patient ID: Alexander [name], Alexander., male   DOB: 11/20/1921, 81 y.o.   MRN: [id number]     Primary M.D.: Alexander [name]  HPI: Alexander [name] is a 81 y.o. male who presents to the office for a one year followup cardiologic evaluation.  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  In 1996 while living in [place] he underwent CABG surgery and had a LIMA placed to the LAD, vein to the marginal, vein to the ramus intermediate, and vein to the PDA. In October 2011 catheterization by me showed severe CAD with 70 - 80% ostial left main stenosis, total occlusion of the LAD after diagonal, 95% ramus intermediate stenosis, total occlusion of the marginal branch of the circumflex coronary artery, and severe diffuse native RCA disease with total occlusion of the PDA at the ostium. A graft that supplied the marginal was occluded as was the vein graft that supplied the ramus intermediate vessel. He had patent vein graft supplying the PDA branch of his RCA and a patent LIMA to the LAD. He has been on medical therapy. I did try adding Ranexa to his medical regimen but he did not tolerate this. He does have mild renal insufficiency with creatinines in the 1.5-1.6 range which have been followed by Alexander [name] an now Alexander [name]. He has a hiatal hernia. He had been on Crestor as well as Vytorin in the past but admits to myalgias in the past on statins.  Recently, he had been on Zetia.  However, he has self discontinued the Zetia since he felt he was having some vague muscle aches.  Over the past year, he has continued to remain stable.  He is without anginal symptomatology.  He has not been exercising and walking regularly since he does not like to walk by himself.  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  He is unaware of any palpitations.  He denies presyncope or syncope.  He has renal insufficiency.  He tells me his creatinines have run in the 1.8 range.  His last lipid panel in  December 2015 showed a total cholesterol 133, triglycerides 133, LDL 64, HDL 42.  He presents for evaluation.  Past Medical History  Diagnosis Date  . Hypertension   . Hyperlipidemia   . GERD (gastroesophageal reflux disease)   . Spinal stenosis   . Migraines     OCULAR  . BPH (benign prostatic hyperplasia)   . CKD (chronic kidney disease), stage III   . Vitamin deficiency   . Diverticulitis   . Heart murmur   . Calcium oxalate renal stones   . Renal lesion   . Dry senile macular degeneration   . Allergic rhinitis   . Anemia   . Peripheral neuropathy ([place])   . Thrombocytosis ([place]) 08/18/2012    Platelets 711,000  Hb 14, WBC 8,500 08/05/12  . Hearing loss in left ear   . Essential thrombocythemia ([place]) 10/10/2013  . Anemia, normocytic normochromic 10/10/2013  . Anemia, chronic renal failure 10/10/2013    Past Surgical History  Procedure Laterality Date  . Tonsillectomy  1937  . Appendectomy  1942  . Inguinal hernia repair      x2  . Coronary artery bypass graft  1996  . Nasal concha bullosa resection    . Laparoscopic cholecystectomy  1992  . Left renal mass ablated  12/11  . Transthoracic echocardiogram  12/10/2011    LVEF>70%, stage1  diastolic dysfunction, mild to mod. LAE,   . Transthoracic echocardiogram  07/25/2008    mild-moderate MR, mild-modTricuspid regurg.  [name] Nm myocar perf wall motion  02/14/2010    post EF 71%, Exercise cap.7METS  . Cardiac catheterization  02/19/2010    EF55%, medically treated,     No Known Allergies  Current Outpatient Prescriptions  Medication Sig Dispense Refill  . aluminum & magnesium hydroxide-simethicone (MYLANTA) 500-450-40 MG/5ML suspension Take by mouth every 6 (six) hours as needed. 200-200-57m/5ml     . aspirin 81 MG tablet Take 81 mg by mouth daily.      . cholecalciferol (VITAMIN D) 1000 UNITS tablet Take 2,000 Units by mouth daily.     . Coenzyme Q10 (CO Q-10) 300 MG CAPS Take 1 capsule by mouth daily.    . Cyanocobalamin  (VITAMIN B-12 IJ) Inject as directed. monthly     . diphenhydrAMINE (BENADRYL) 25 MG tablet Take 25 mg by mouth at bedtime as needed.    . finasteride (PROSCAR) 5 MG tablet Take 1 tablet by mouth 3 (three) times a week. TIW    . hydroxyurea (HYDREA) 500 MG capsule Take 1 capsule (500 mg total) by mouth daily. May take with food to minimize GI side effects. 60 capsule 3  . losartan (COZAAR) 25 MG tablet Take 1 tablet (25 mg total) by mouth daily. 90 tablet 3  . LUTEIN PO Take 6 mg by mouth daily.     . metoprolol tartrate (LOPRESSOR) 25 MG tablet Take 1 tablet (25 mg total) by mouth 2 (two) times  daily. 180 tablet 3  . Multiple Vitamin (MULTIVITAMIN) tablet Take 1 tablet by mouth daily.      . Multiple Vitamins-Minerals (PRESERVISION AREDS PO) Take 1 capsule by mouth 2 (two) times daily.      .Marland KitchenNITROSTAT 0.4 MG SL tablet as directed.    . ranitidine (ZANTAC) 150 MG tablet Take 150 mg by mouth daily.    . rosuvastatin (CRESTOR) 10 MG tablet Take 1 tablet (10 mg total) by mouth daily. 90 tablet 3  . zolpidem (AMBIEN) 10 MG tablet Take 1 tablet (10 mg total) by mouth at bedtime as needed for sleep. 30 tablet 2   No current facility-administered medications for this visit.    Social History   Social History  . Marital Status: Widowed    Spouse Name: N/A  . Number of Children: 3  . Years of Education: N/A   Occupational History  . Not on file.   Social History Main Topics  . Smoking status: Never Smoker   . Smokeless tobacco: Never Used  . Alcohol Use: No  . Drug Use: No  . Sexual Activity: Not on file   Other Topics Concern  . Not on file   Social History Narrative    Socially he is widowed. He is retired [employer] account executive  He has 3 children and 9 grandchildren. There's no tobacco use.   ROS General: Negative; No fevers, chills, or night sweats;  HEENT: Negative; No changes in vision or hearing, sinus congestion, difficulty swallowing Pulmonary: Negative; No  cough, wheezing, shortness of breath, hemoptysis Cardiovascular:  See HPI; No chest pain, presyncope, syncope, palpitations GI: Positive for a large hiatal hernia; No nausea, vomiting, diarrhea, or abdominal pain GU: Negative; No dysuria, hematuria, or difficulty voiding Musculoskeletal: Negative; no myalgias, joint pain, or weakness Hematologic/Oncology: He is followed by Alexander [name].; no easy bruising, bleeding Endocrine: Negative; no heat/cold intolerance; no diabetes Neuro:  Negative; no changes in balance, headaches Skin: Negative; No rashes or skin lesions Psychiatric: Negative; No behavioral problems, depression Sleep: Negative; No snoring, daytime sleepiness, hypersomnolence, bruxism, restless legs, hypnogognic hallucinations, no cataplexy Other comprehensive 14 point system review is negative.   PE BP 124/56 mmHg  Pulse 54  Ht 5' 7"  (1.702 m)  Wt 132 lb 4.8 oz (60.011 kg)  BMI 20.72 kg/m2  Wt Readings from Last 3 Encounters:  02/06/15 132 lb 4.8 oz (60.011 kg)  12/04/14 133 lb (60.328 kg)  08/02/14 126 lb 9.6 oz (57.425 kg)   General: Alert, oriented, no distress.  Skin: normal turgor, no rashes HEENT: Normocephalic, atraumatic. Pupils round and reactive; sclera anicteric;no lid lag.  Nose without nasal septal hypertrophy Mouth/Parynx benign; Mallinpatti scale 2 Neck: No JVD, no carotid bruits with normal carotid Lungs: clear to ausculatation and percussion; no wheezing or rales Chest wall: No tenderness to Heart: RRR, s1 s2 normal 1/6 systolic and 3-9/7 diastolic murmur compatible with his documented aortic sclerosis and insufficiency; no S3 or S4 gallop. No parasternal lift, heave, or rub Abdomen: soft, nontender; no hepatosplenomehaly, BS+; abdominal aorta nontender and not dilated by palpation. Back: No CVA tenderness the Pulses 2+ Extremities: no clubbing cyanosis or edema, Homan's sign negative  Neurologic: grossly nonfocal Psychological: Normal  affect and mood  ECG (independently read by me): Sinus bradycardia 54 bpm.  Poor progression V1 through V3.  Normal intervals.  September 2015 ECG (independently read by me): Sinus bradycardia 52 beats per minute.  Normal intervals.  No ST segment changes.  06/08/2013 ECG: Sinus bradycardia 49 beats per minute. No ectopy. Normal intervals.  Prior ECG of 11/30/2012: Sinus rhythm at 53 beats per minute. Normal intervals.  LABS: BMP Latest Ref Rng 08/07/2014 08/07/2014 05/01/2014  Glucose 70 - 99 mg/dL 103(H) - 96  BUN 6 - 23 mg/dL 33(H) - 31(H)  Creatinine 0.50 - 1.35 mg/dL 1.88(H) - 1.74(H)  Sodium 135 - 145 mmol/L 141 - 142  Potassium 3.5 - 5.1 mmol/L 4.0 - 4.6  Chloride 96 - 112 mmol/L 109 - 108  CO2 19 - 32 mmol/L 24 - 21  Calcium 8.6 - 10.2 mg/dL 9.2 8.9 9.4   Hepatic Function Latest Ref Rng 08/07/2014 05/01/2014 04/05/2014  Total Protein 6.0 - 8.3 g/dL - 6.6 6.5  Albumin 3.5 - 5.2 g/dL 3.7 4.1 3.9  AST 0 - 37 U/L - 21 30  ALT 0 - 53 U/L - 17 29  Alk Phosphatase 39 - 117 U/L - 51 49  Total Bilirubin 0.2 - 1.2 mg/dL - 0.8 0.8   CBC Latest Ref Rng 12/27/2014 11/22/2014 09/26/2014  WBC 3.4 - 10.8 x10E3/uL 4.4 5.1 5.6  Hemoglobin 13.0 - 17.0 g/dL - 12.5(L) 11.5(L)  Hematocrit 37.5 - 51.0 % 34.9(L) 37.0(L) 34.6(L)  Platelets 150 - 400 K/uL - 153 151   Lab Results  Component Value Date   MCV 112.8* 11/22/2014   MCV 118.1* 09/26/2014   MCV 119.3* 08/28/2014   Lab Results  Component Value Date   TSH 4.049 05/01/2014  No results found for: HGBA1C   Lipid Panel     Component Value Date/Time   CHOL 133 05/01/2014 0955   TRIG 133 05/01/2014 0955   HDL 42 05/01/2014 0955   CHOLHDL 3.2 05/01/2014 0955   VLDL 27 05/01/2014 0955   LDLCALC 64 05/01/2014 0955    RADIOLOGY: No results found.   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 He has documented  graft occlusion of 2 of his grafts and has severe native coronary artery disease. Remotely, we tried adding Ranexa to his medication but he has stopped this due to concerns of tolerability.  His blood pressure is controlled on his current medical care.  Shins and he is not having anginal symptoms on losartan 25 mg daily, metoprolol, tartrate 25 mg twice a day.  He currently is on metoprolol tartrate 25 mg twice a day in addition to losartan 25 mg daily.  He denies any change in development of any shortness of breath.  When I saw him last year since he had stopped his Zetia.  In light of his significant CAD with left main disease and with 2 grafts being occluded.  I suggested a trial of Crestor 10 mg.  He had taken this for a while but admits that he has not been taking this at all recently.  He has stage III chronic kidney disease, and remotely had seen Alexander [name] and at one time has seen Alexander. Baird [name].  He had asked for renewal of zolpidem and and this was prescribed today.  He is followed by Alexander [name] and remotely had had thrombocytosis with platelets as high as 841,000.  This has normalized and has remained stable.  From a cardiac standpoint, he continues to do well.  I will see him in 6 months for reevaluation.  Troy Sine, MD, [facility]  02/06/2015 7:06 PM

## 2015-02-20 ENCOUNTER — Other Ambulatory Visit: Payer: Medicare Other

## 2015-02-22 DIAGNOSIS — H353231 Exudative age-related macular degeneration, bilateral, with active choroidal neovascularization: Secondary | ICD-10-CM | POA: Diagnosis not present

## 2015-02-22 DIAGNOSIS — H2511 Age-related nuclear cataract, right eye: Secondary | ICD-10-CM | POA: Diagnosis not present

## 2015-02-22 DIAGNOSIS — Z961 Presence of intraocular lens: Secondary | ICD-10-CM | POA: Diagnosis not present

## 2015-02-28 ENCOUNTER — Ambulatory Visit (INDEPENDENT_AMBULATORY_CARE_PROVIDER_SITE_OTHER): Payer: Medicare Other | Admitting: Podiatry

## 2015-02-28 ENCOUNTER — Encounter: Payer: Self-pay | Admitting: Podiatry

## 2015-02-28 DIAGNOSIS — B351 Tinea unguium: Secondary | ICD-10-CM | POA: Diagnosis not present

## 2015-02-28 DIAGNOSIS — M79673 Pain in unspecified foot: Secondary | ICD-10-CM

## 2015-02-28 NOTE — Progress Notes (Signed)
Patient ID: Alexander [name], Dr., male   DOB: 1921-11-18, 81 y.o.   MRN: [id number] Complaint:  Visit Type: Patient returns to my office for continued preventative foot care services. Complaint: Patient states" my nails have grown long and thick and become painful to walk and wear shoes" . He presents for preventative foot care services. No changes to ROS  Podiatric Exam: Vascular: dorsalis pedis and posterior tibial pulses are palpable bilateral. Capillary return is immediate. Temperature gradient is WNL. Skin turgor WNL  Sensorium: Normal Semmes Weinstein monofilament test. Normal tactile sensation bilaterally. Nail Exam: Pt has thick disfigured discolored nails with subungual debris noted bilateral entire nail hallux through fifth toenails Ulcer Exam: There is no evidence of ulcer or pre-ulcerative changes or infection. Orthopedic Exam: Muscle tone and strength are WNL. No limitations in general ROM. No crepitus or effusions noted. Foot type and digits show no abnormalities. Bony prominences are unremarkable. Skin: No Porokeratosis. No infection or ulcers  Diagnosis:  Tinea unguium, Pain in right toe, pain in left toes  Treatment & Plan Procedures and Treatment: Consent by patient was obtained for treatment procedures. The patient understood the discussion of treatment and procedures well. All questions were answered thoroughly reviewed. Debridement of mycotic and hypertrophic toenails, 1 through 5 bilateral and clearing of subungual debris. No ulceration, no infection noted.  Return Visit-Office Procedure: Patient instructed to return to the office for a follow up visit 3 months for continued evaluation and treatment.

## 2015-03-01 DIAGNOSIS — H353231 Exudative age-related macular degeneration, bilateral, with active choroidal neovascularization: Secondary | ICD-10-CM | POA: Diagnosis not present

## 2015-03-07 ENCOUNTER — Ambulatory Visit: Payer: Medicare Other | Admitting: Podiatry

## 2015-03-14 ENCOUNTER — Other Ambulatory Visit (INDEPENDENT_AMBULATORY_CARE_PROVIDER_SITE_OTHER): Payer: Medicare Other

## 2015-03-14 ENCOUNTER — Other Ambulatory Visit: Payer: Self-pay | Admitting: *Deleted

## 2015-03-14 DIAGNOSIS — N183 Chronic kidney disease, stage 3 unspecified: Secondary | ICD-10-CM

## 2015-03-14 DIAGNOSIS — D473 Essential (hemorrhagic) thrombocythemia: Secondary | ICD-10-CM

## 2015-03-14 DIAGNOSIS — D75839 Thrombocytosis, unspecified: Secondary | ICD-10-CM

## 2015-03-14 DIAGNOSIS — I10 Essential (primary) hypertension: Secondary | ICD-10-CM

## 2015-03-14 NOTE — Addendum Note (Signed)
Addended by: Truddie Crumble on: 03/14/2015 01:44 PM   Modules accepted: Orders

## 2015-03-15 ENCOUNTER — Encounter: Payer: Self-pay | Admitting: Cardiovascular Disease

## 2015-03-15 LAB — CBC WITH DIFFERENTIAL/PLATELET
BASOS ABS: 0 10*3/uL (ref 0.0–0.2)
Basos: 1 %
EOS (ABSOLUTE): 0.1 10*3/uL (ref 0.0–0.4)
Eos: 3 %
HEMOGLOBIN: 12.4 g/dL — AB (ref 12.6–17.7)
Hematocrit: 36.5 % — ABNORMAL LOW (ref 37.5–51.0)
Immature Grans (Abs): 0 10*3/uL (ref 0.0–0.1)
Immature Granulocytes: 0 %
LYMPHS ABS: 2 10*3/uL (ref 0.7–3.1)
Lymphs: 44 %
MCH: 38.6 pg — AB (ref 26.6–33.0)
MCHC: 34 g/dL (ref 31.5–35.7)
MCV: 114 fL — ABNORMAL HIGH (ref 79–97)
MONOCYTES: 11 %
Monocytes Absolute: 0.5 10*3/uL (ref 0.1–0.9)
NEUTROS ABS: 1.9 10*3/uL (ref 1.4–7.0)
Neutrophils: 41 %
PLATELETS: 159 10*3/uL (ref 150–379)
RBC: 3.21 x10E6/uL — AB (ref 4.14–5.80)
RDW: 14.8 % (ref 12.3–15.4)
WBC: 4.5 10*3/uL (ref 3.4–10.8)

## 2015-03-15 LAB — CMP14 + ANION GAP
A/G RATIO: 1.6 (ref 1.1–2.5)
ALK PHOS: 64 IU/L (ref 39–117)
ALT: 12 IU/L (ref 0–44)
AST: 19 IU/L (ref 0–40)
Albumin: 3.9 g/dL (ref 3.2–4.6)
Anion Gap: 14 mmol/L (ref 10.0–18.0)
BILIRUBIN TOTAL: 0.4 mg/dL (ref 0.0–1.2)
BUN/Creatinine Ratio: 19 (ref 10–22)
BUN: 32 mg/dL (ref 10–36)
CHLORIDE: 106 mmol/L (ref 97–106)
CO2: 21 mmol/L (ref 18–29)
Calcium: 8.9 mg/dL (ref 8.6–10.2)
Creatinine, Ser: 1.7 mg/dL — ABNORMAL HIGH (ref 0.76–1.27)
GFR calc non Af Amer: 34 mL/min/{1.73_m2} — ABNORMAL LOW (ref >59)
GFR, EST AFRICAN AMERICAN: 40 mL/min/{1.73_m2} — AB (ref >59)
GLUCOSE: 94 mg/dL (ref 65–99)
Globulin, Total: 2.4 g/dL (ref 1.5–4.5)
POTASSIUM: 4.6 mmol/L (ref 3.5–5.2)
Sodium: 141 mmol/L (ref 136–144)
Total Protein: 6.3 g/dL (ref 6.0–8.5)

## 2015-03-15 NOTE — Progress Notes (Signed)
Lab results were faxed to the following physicians per the patient's request: Dr Ellouise Newer [phone] Dr Crist Infante  [phone] Dr Pearson Grippe [phone] Dr Marilynne Halsted  [phone] Dr [name]  [phone]  03-15-2015  0930 am  Maryan Rued, PBT

## 2015-03-16 ENCOUNTER — Telehealth: Payer: Self-pay | Admitting: *Deleted

## 2015-03-16 NOTE — Telephone Encounter (Signed)
-----   Message from Annia Belt, MD sent at 03/15/2015  7:55 AM EST ----- Call pt: counts stable - continue current dose of Hydrea

## 2015-03-16 NOTE — Telephone Encounter (Signed)
Pt called - no answer; left message labs are stable and continue current dose of Hydrea per Dr [name]. And I will mail a copy to pt (his usual request).

## 2015-03-19 DIAGNOSIS — L820 Inflamed seborrheic keratosis: Secondary | ICD-10-CM | POA: Diagnosis not present

## 2015-03-19 DIAGNOSIS — L821 Other seborrheic keratosis: Secondary | ICD-10-CM | POA: Diagnosis not present

## 2015-03-20 ENCOUNTER — Other Ambulatory Visit: Payer: Medicare Other

## 2015-04-03 ENCOUNTER — Encounter: Payer: Self-pay | Admitting: Podiatry

## 2015-04-03 ENCOUNTER — Ambulatory Visit (INDEPENDENT_AMBULATORY_CARE_PROVIDER_SITE_OTHER): Payer: Medicare Other | Admitting: Podiatry

## 2015-04-03 DIAGNOSIS — M79673 Pain in unspecified foot: Secondary | ICD-10-CM | POA: Diagnosis not present

## 2015-04-03 DIAGNOSIS — B351 Tinea unguium: Secondary | ICD-10-CM

## 2015-04-03 NOTE — Progress Notes (Signed)
Subjective:     Patient ID: Alexander [name], Dr., male   DOB: [birth year], 81 y.o.   MRN: [id number]  HPI this patient presents the office with chief complaint of a painful toenail on the second toe of the right foot. He states that the nail has split and is causing pain and discomfort as he walks and wears his shoes. There were nail spicules at both the medial and lateral border of the second toenail of the right foot. No evidence of any redness, swelling or infection noted. He presents the office for evaluation and treatment of this condition   Review of Systems     Objective:   Physical Exam Podiatric Exam: Vascular: dorsalis pedis and posterior tibial pulses are palpable bilateral. Capillary return is immediate. Temperature gradient is WNL. Skin turgor WNL  Sensorium: Normal Semmes Weinstein monofilament test. Normal tactile sensation bilaterally. Nail Exam: Pt has nail spicules along the medial and lateral borders second toe right foot. Ulcer Exam: There is no evidence of ulcer or pre-ulcerative changes or infection. Orthopedic Exam: Muscle tone and strength are WNL. No limitations in general ROM. No crepitus or effusions noted. Foot type and digits show no abnormalities. Bony prominences are unremarkable. Skin: No Porokeratosis. No infection or ulcers     Assessment:     Ingrowing toenail second toe right foot     Plan:     Debridement of offending nail spicule right foot.  Gardiner Barefoot DPM

## 2015-04-05 DIAGNOSIS — H353231 Exudative age-related macular degeneration, bilateral, with active choroidal neovascularization: Secondary | ICD-10-CM | POA: Diagnosis not present

## 2015-04-06 ENCOUNTER — Encounter: Payer: Self-pay | Admitting: Pulmonary Disease

## 2015-04-06 ENCOUNTER — Ambulatory Visit (INDEPENDENT_AMBULATORY_CARE_PROVIDER_SITE_OTHER): Payer: Medicare Other | Admitting: Pulmonary Disease

## 2015-04-06 VITALS — BP 110/62 | HR 57 | Ht 67.0 in | Wt 133.0 lb

## 2015-04-06 DIAGNOSIS — J4520 Mild intermittent asthma, uncomplicated: Secondary | ICD-10-CM | POA: Diagnosis not present

## 2015-04-06 DIAGNOSIS — I25700 Atherosclerosis of coronary artery bypass graft(s), unspecified, with unstable angina pectoris: Secondary | ICD-10-CM | POA: Diagnosis not present

## 2015-04-06 MED ORDER — ALBUTEROL SULFATE 108 (90 BASE) MCG/ACT IN AEPB: 1.0000 | INHALATION_SPRAY | Freq: Four times a day (QID) | RESPIRATORY_TRACT | Status: DC | PRN

## 2015-04-06 NOTE — Patient Instructions (Signed)
Return to clinic in 6 months.

## 2015-04-06 NOTE — Progress Notes (Signed)
Subjective:    Patient ID: Alexander [name], Dr., male    DOB: Sep 20, 1921, 81 y.o.   MRN: [id number]  HPI Return visit for asthma.  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 He is a former patient of [name]. He's had several exacerbations earlier this year but has been stable since then. At the last visit with [name] in August he was taken off his inhaled steroid. He has remained stable since then with no exacerbations. He does complain occasionally of dyspnea on exertion but no wheezing, no ER visits or hospitalizations. He is also not on albuterol.  Spirometry 08/05/11 FVC 2.70 (73%] FEV1 1.86 [68%] F/F 69   Past Medical History  Diagnosis Date  . Hypertension   . Hyperlipidemia   . GERD (gastroesophageal reflux disease)   . Spinal stenosis   . Migraines     OCULAR  . BPH (benign prostatic hyperplasia)   . CKD (chronic kidney disease), stage III   . Vitamin deficiency   . Diverticulitis   . Heart murmur   . Calcium oxalate renal stones   . Renal lesion   . Dry senile macular degeneration   . Allergic rhinitis   . Anemia   . Peripheral neuropathy ([place])   . Thrombocytosis ([place]) 08/18/2012    Platelets 711,000  Hb 14, WBC 8,500 08/05/12  . Hearing loss in left ear   . Essential thrombocythemia ([place]) 10/10/2013  . Anemia, normocytic normochromic 10/10/2013  . Anemia, chronic renal failure 10/10/2013    Current outpatient prescriptions:  .  aluminum & magnesium hydroxide-simethicone (MYLANTA) 500-450-40 MG/5ML suspension, Take by mouth every 6 (six) hours as needed. 200-200-20mg /78ml , Disp: , Rfl:  .  aspirin 81 MG tablet, Take 81 mg by mouth daily.  , Disp: , Rfl:  .  cholecalciferol (VITAMIN D) 1000 UNITS tablet, Take 2,000 Units by mouth daily. , Disp: , Rfl:  .  Coenzyme Q10 (CO Q-10) 300 MG CAPS, Take 1 capsule by mouth daily., Disp: , Rfl:  .  Cyanocobalamin (VITAMIN B-12 IJ), Inject as directed. monthly , Disp: , Rfl:  .  diphenhydrAMINE (BENADRYL) 25 MG tablet,  Take 25 mg by mouth at bedtime as needed., Disp: , Rfl:  .  finasteride (PROSCAR) 5 MG tablet, Take 1 tablet by mouth 3 (three) times a week. TIW, Disp: , Rfl:  .  hydroxyurea (HYDREA) 500 MG capsule, Take 1 capsule (500 mg total) by mouth daily. May take with food to minimize GI side effects., Disp: 60 capsule, Rfl: 3 .  losartan (COZAAR) 25 MG tablet, Take 1 tablet (25 mg total) by mouth daily., Disp: 90 tablet, Rfl: 3 .  LUTEIN PO, Take 6 mg by mouth daily. , Disp: , Rfl:  .  metoprolol tartrate (LOPRESSOR) 25 MG tablet, Take 1 tablet (25 mg total) by mouth 2 (two) times  daily., Disp: 180 tablet, Rfl: 3 .  Multiple Vitamin (MULTIVITAMIN) tablet, Take 1 tablet by mouth daily.  , Disp: , Rfl:  .  Multiple Vitamins-Minerals (PRESERVISION AREDS PO), Take 1 capsule by mouth 2 (two) times daily.  , Disp: , Rfl:  .  NITROSTAT 0.4 MG SL tablet, as directed., Disp: , Rfl:  .  ranitidine (ZANTAC) 150 MG tablet, Take 150 mg by mouth daily., Disp: , Rfl:    Review of Systems Denies any cough, dyspnea, wheezing, sputum production, hemoptysis. Denies any nausea, vomiting, diarrhea, constipation. Denies any fevers, chills, malaise, fatigue. Denies any chest pain, palpitations.  All other review of systems are negative    Objective:   Physical Exam  Blood pressure 110/62, pulse 57, height 5\' 7"  (1.702 m), weight 133 lb (60.328 kg), SpO2 97 %.  Gen: Pleasant elderly male. No apparent distress Neuro: No gross focal deficits. Neck: No JVD, lymphadenopathy, thyromegaly. RS: Clear on auscultation, no crackles, wheeze. CVS: S1-S2 heard, no murmurs rubs gallops. Abdomen: Soft, positive bowel sounds. Extremities: No edema.    Assessment & Plan:  Mild intermittent asthma  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 He is requesting an albuterol inhaler to be used in cases of emergency.  Return to clinic in 6 months or sooner if any new respiratory  symptoms arise.  Marshell Garfinkel MD [facility] Pager [phone] If no answer or after 3pm call: [phone] 04/06/2015, 4:22 PM

## 2015-04-09 DIAGNOSIS — Z951 Presence of aortocoronary bypass graft: Secondary | ICD-10-CM | POA: Diagnosis not present

## 2015-04-09 DIAGNOSIS — H2511 Age-related nuclear cataract, right eye: Secondary | ICD-10-CM | POA: Diagnosis not present

## 2015-04-09 DIAGNOSIS — Z79899 Other long term (current) drug therapy: Secondary | ICD-10-CM | POA: Diagnosis not present

## 2015-04-09 DIAGNOSIS — N189 Chronic kidney disease, unspecified: Secondary | ICD-10-CM | POA: Diagnosis not present

## 2015-04-09 DIAGNOSIS — I2510 Atherosclerotic heart disease of native coronary artery without angina pectoris: Secondary | ICD-10-CM | POA: Diagnosis not present

## 2015-04-09 DIAGNOSIS — Z9842 Cataract extraction status, left eye: Secondary | ICD-10-CM | POA: Diagnosis not present

## 2015-04-09 DIAGNOSIS — I129 Hypertensive chronic kidney disease with stage 1 through stage 4 chronic kidney disease, or unspecified chronic kidney disease: Secondary | ICD-10-CM | POA: Diagnosis not present

## 2015-04-09 DIAGNOSIS — E7800 Pure hypercholesterolemia, unspecified: Secondary | ICD-10-CM | POA: Diagnosis not present

## 2015-04-09 DIAGNOSIS — Z7951 Long term (current) use of inhaled steroids: Secondary | ICD-10-CM | POA: Diagnosis not present

## 2015-04-09 DIAGNOSIS — Z7982 Long term (current) use of aspirin: Secondary | ICD-10-CM | POA: Diagnosis not present

## 2015-04-09 DIAGNOSIS — Z961 Presence of intraocular lens: Secondary | ICD-10-CM | POA: Diagnosis not present

## 2015-04-12 DIAGNOSIS — Z79899 Other long term (current) drug therapy: Secondary | ICD-10-CM | POA: Diagnosis not present

## 2015-04-12 DIAGNOSIS — H25811 Combined forms of age-related cataract, right eye: Secondary | ICD-10-CM | POA: Diagnosis not present

## 2015-04-12 DIAGNOSIS — I10 Essential (primary) hypertension: Secondary | ICD-10-CM | POA: Diagnosis not present

## 2015-04-12 DIAGNOSIS — Z961 Presence of intraocular lens: Secondary | ICD-10-CM | POA: Diagnosis not present

## 2015-04-12 DIAGNOSIS — N189 Chronic kidney disease, unspecified: Secondary | ICD-10-CM | POA: Diagnosis not present

## 2015-04-12 DIAGNOSIS — H353230 Exudative age-related macular degeneration, bilateral, stage unspecified: Secondary | ICD-10-CM | POA: Diagnosis not present

## 2015-04-12 DIAGNOSIS — Z951 Presence of aortocoronary bypass graft: Secondary | ICD-10-CM | POA: Diagnosis not present

## 2015-04-12 DIAGNOSIS — I129 Hypertensive chronic kidney disease with stage 1 through stage 4 chronic kidney disease, or unspecified chronic kidney disease: Secondary | ICD-10-CM | POA: Diagnosis not present

## 2015-04-12 DIAGNOSIS — Z9841 Cataract extraction status, right eye: Secondary | ICD-10-CM | POA: Diagnosis not present

## 2015-04-12 DIAGNOSIS — I2510 Atherosclerotic heart disease of native coronary artery without angina pectoris: Secondary | ICD-10-CM | POA: Diagnosis not present

## 2015-04-16 DIAGNOSIS — N183 Chronic kidney disease, stage 3 (moderate): Secondary | ICD-10-CM | POA: Diagnosis not present

## 2015-04-16 DIAGNOSIS — I10 Essential (primary) hypertension: Secondary | ICD-10-CM | POA: Diagnosis not present

## 2015-04-23 DIAGNOSIS — N183 Chronic kidney disease, stage 3 (moderate): Secondary | ICD-10-CM | POA: Diagnosis not present

## 2015-04-23 DIAGNOSIS — M810 Age-related osteoporosis without current pathological fracture: Secondary | ICD-10-CM | POA: Diagnosis not present

## 2015-04-23 DIAGNOSIS — Z6822 Body mass index (BMI) 22.0-22.9, adult: Secondary | ICD-10-CM | POA: Diagnosis not present

## 2015-04-23 DIAGNOSIS — I2510 Atherosclerotic heart disease of native coronary artery without angina pectoris: Secondary | ICD-10-CM | POA: Diagnosis not present

## 2015-04-23 DIAGNOSIS — D473 Essential (hemorrhagic) thrombocythemia: Secondary | ICD-10-CM | POA: Diagnosis not present

## 2015-04-23 DIAGNOSIS — R946 Abnormal results of thyroid function studies: Secondary | ICD-10-CM | POA: Diagnosis not present

## 2015-04-23 DIAGNOSIS — R7301 Impaired fasting glucose: Secondary | ICD-10-CM | POA: Diagnosis not present

## 2015-04-24 ENCOUNTER — Other Ambulatory Visit: Payer: Medicare Other

## 2015-05-17 DIAGNOSIS — H353231 Exudative age-related macular degeneration, bilateral, with active choroidal neovascularization: Secondary | ICD-10-CM | POA: Diagnosis not present

## 2015-05-22 ENCOUNTER — Other Ambulatory Visit: Payer: Medicare Other

## 2015-05-22 ENCOUNTER — Other Ambulatory Visit: Payer: Self-pay | Admitting: Oncology

## 2015-05-22 DIAGNOSIS — N183 Chronic kidney disease, stage 3 unspecified: Secondary | ICD-10-CM

## 2015-05-22 DIAGNOSIS — D473 Essential (hemorrhagic) thrombocythemia: Secondary | ICD-10-CM

## 2015-05-23 ENCOUNTER — Encounter: Payer: Self-pay | Admitting: Podiatry

## 2015-05-23 ENCOUNTER — Ambulatory Visit (INDEPENDENT_AMBULATORY_CARE_PROVIDER_SITE_OTHER): Payer: Medicare Other | Admitting: Podiatry

## 2015-05-23 ENCOUNTER — Other Ambulatory Visit (INDEPENDENT_AMBULATORY_CARE_PROVIDER_SITE_OTHER): Payer: Medicare Other

## 2015-05-23 DIAGNOSIS — M79673 Pain in unspecified foot: Secondary | ICD-10-CM | POA: Diagnosis not present

## 2015-05-23 DIAGNOSIS — D75839 Thrombocytosis, unspecified: Secondary | ICD-10-CM

## 2015-05-23 DIAGNOSIS — N183 Chronic kidney disease, stage 3 unspecified: Secondary | ICD-10-CM

## 2015-05-23 DIAGNOSIS — B351 Tinea unguium: Secondary | ICD-10-CM

## 2015-05-23 DIAGNOSIS — D473 Essential (hemorrhagic) thrombocythemia: Secondary | ICD-10-CM | POA: Diagnosis not present

## 2015-05-23 NOTE — Progress Notes (Signed)
Patient ID: Alexander [name], Dr., male   DOB: 10/09/1921, 82 y.o.   MRN: [id number] Complaint:  Visit Type: Patient returns to my office for continued preventative foot care services. Complaint: Patient states" my nails have grown long and thick and become painful to walk and wear shoes" . He presents for preventative foot care services. No changes to ROS  Podiatric Exam: Vascular: dorsalis pedis and posterior tibial pulses are palpable bilateral. Capillary return is immediate. Temperature gradient is WNL. Skin turgor WNL  Sensorium: Normal Semmes Weinstein monofilament test. Normal tactile sensation bilaterally. Nail Exam: Pt has thick disfigured discolored nails with subungual debris noted bilateral entire nail hallux through fifth toenails Ulcer Exam: There is no evidence of ulcer or pre-ulcerative changes or infection. Orthopedic Exam: Muscle tone and strength are WNL. No limitations in general ROM. No crepitus or effusions noted. Foot type and digits show no abnormalities. Bony prominences are unremarkable. Skin: No Porokeratosis. No infection or ulcers  Diagnosis:  Tinea unguium, Pain in right toe, pain in left toes  Treatment & Plan Procedures and Treatment: Consent by patient was obtained for treatment procedures. The patient understood the discussion of treatment and procedures well. All questions were answered thoroughly reviewed. Debridement of mycotic and hypertrophic toenails, 1 through 5 bilateral and clearing of subungual debris. No ulceration, no infection noted.  Return Visit-Office Procedure: Patient instructed to return to the office for a follow up visit 3 months for continued evaluation and treatment.   Gardiner Barefoot DPM

## 2015-05-24 ENCOUNTER — Telehealth: Payer: Self-pay | Admitting: *Deleted

## 2015-05-24 LAB — CBC WITH DIFFERENTIAL/PLATELET
BASOS ABS: 0 10*3/uL (ref 0.0–0.2)
Basos: 1 %
EOS (ABSOLUTE): 0.1 10*3/uL (ref 0.0–0.4)
EOS: 3 %
HEMATOCRIT: 35.5 % — AB (ref 37.5–51.0)
HEMOGLOBIN: 12.1 g/dL — AB (ref 12.6–17.7)
IMMATURE GRANS (ABS): 0 10*3/uL (ref 0.0–0.1)
IMMATURE GRANULOCYTES: 0 %
LYMPHS ABS: 1.6 10*3/uL (ref 0.7–3.1)
Lymphs: 35 %
MCH: 39 pg — ABNORMAL HIGH (ref 26.6–33.0)
MCHC: 34.1 g/dL (ref 31.5–35.7)
MCV: 115 fL — ABNORMAL HIGH (ref 79–97)
MONOCYTES: 13 %
MONOS ABS: 0.6 10*3/uL (ref 0.1–0.9)
NEUTROS ABS: 2.3 10*3/uL (ref 1.4–7.0)
Neutrophils: 48 %
Platelets: 179 10*3/uL (ref 150–379)
RBC: 3.1 x10E6/uL — AB (ref 4.14–5.80)
RDW: 14 % (ref 12.3–15.4)
WBC: 4.7 10*3/uL (ref 3.4–10.8)

## 2015-05-24 LAB — COMPREHENSIVE METABOLIC PANEL
ALBUMIN: 4.2 g/dL (ref 3.2–4.6)
ALK PHOS: 66 IU/L (ref 39–117)
ALT: 12 IU/L (ref 0–44)
AST: 21 IU/L (ref 0–40)
Albumin/Globulin Ratio: 1.9 (ref 1.1–2.5)
BUN / CREAT RATIO: 18 (ref 10–22)
BUN: 33 mg/dL (ref 10–36)
Bilirubin Total: 0.4 mg/dL (ref 0.0–1.2)
CALCIUM: 9.4 mg/dL (ref 8.6–10.2)
CO2: 22 mmol/L (ref 18–29)
CREATININE: 1.81 mg/dL — AB (ref 0.76–1.27)
Chloride: 105 mmol/L (ref 96–106)
GFR calc Af Amer: 37 mL/min/{1.73_m2} — ABNORMAL LOW (ref >59)
GFR calc non Af Amer: 32 mL/min/{1.73_m2} — ABNORMAL LOW (ref >59)
GLOBULIN, TOTAL: 2.2 g/dL (ref 1.5–4.5)
GLUCOSE: 97 mg/dL (ref 65–99)
Potassium: 4.9 mmol/L (ref 3.5–5.2)
SODIUM: 143 mmol/L (ref 134–144)
Total Protein: 6.4 g/dL (ref 6.0–8.5)

## 2015-05-24 LAB — LACTATE DEHYDROGENASE: LDH: 157 IU/L (ref 121–224)

## 2015-05-24 NOTE — Progress Notes (Signed)
A copy of the lab results from 05-23-2015 were faxed to the following physicians 05-24-2015 at 1020a 1.  Dr Crist Infante  [phone] 2.  Dr. Pearson Grippe [phone] 3.  [name] [phone]  A copy was also mailed to [name].  Maryan Rued, PBT 05-24-2015 [phone]

## 2015-05-24 NOTE — Telephone Encounter (Signed)
Pt called / informed "lab all looks good. Platelets 179,000, Hb 12, Creatinine chronic increase at 1.8 " and to stay on current dose of Hydrea per Dr [name]. Voiced understanding - stated he's on Hydrea 1 tab daily. And I will him mail copy of labs.

## 2015-05-24 NOTE — Telephone Encounter (Signed)
-----   Message from Annia Belt, MD sent at 05/24/2015  6:56 AM EST ----- Call pt: lab all looks good. Platelets 179,000, Hb 12,  Creatinine chronic increase at 1.8. OK to send him a copy of labs. Stay on current dose of Hydrea

## 2015-06-06 DIAGNOSIS — M810 Age-related osteoporosis without current pathological fracture: Secondary | ICD-10-CM | POA: Diagnosis not present

## 2015-06-06 DIAGNOSIS — N183 Chronic kidney disease, stage 3 (moderate): Secondary | ICD-10-CM | POA: Diagnosis not present

## 2015-06-18 ENCOUNTER — Other Ambulatory Visit (HOSPITAL_COMMUNITY): Payer: Self-pay | Admitting: Internal Medicine

## 2015-06-25 ENCOUNTER — Encounter (HOSPITAL_COMMUNITY): Payer: Self-pay

## 2015-06-25 ENCOUNTER — Other Ambulatory Visit: Payer: Self-pay

## 2015-06-25 ENCOUNTER — Ambulatory Visit (HOSPITAL_COMMUNITY)
Admission: RE | Admit: 2015-06-25 | Discharge: 2015-06-25 | Disposition: A | Discharge status: Home / Self Care | Payer: Medicare Other | Source: Ambulatory Visit | Attending: Internal Medicine | Admitting: Internal Medicine

## 2015-06-25 DIAGNOSIS — M810 Age-related osteoporosis without current pathological fracture: Secondary | ICD-10-CM | POA: Insufficient documentation

## 2015-06-25 MED ORDER — HYDROXYUREA 500 MG PO CAPS: 500.0000 mg | ORAL_CAPSULE | Freq: Every day | ORAL | Status: DC

## 2015-06-25 MED ORDER — DENOSUMAB 60 MG/ML ~~LOC~~ SOLN
60.0000 mg | Freq: Once | SUBCUTANEOUS | Status: AC
  Administered 2015-06-25: 60 mg via SUBCUTANEOUS
  Filled 2015-06-25: qty 1

## 2015-06-25 NOTE — Progress Notes (Signed)
Uneventful 1st injection of Prolia. Pt was observed 15 minutes then discharged ambulatory unaccompanied to elevator. Next tentative appointment in 6 months for 12/25/15 at 1200

## 2015-06-25 NOTE — Discharge Instructions (Signed)
Prolia Denosumab injection What is this medicine? DENOSUMAB (den oh sue mab) slows bone breakdown. Prolia is used to treat osteoporosis in women after menopause and in men. Delton See is used to prevent bone fractures and other bone problems caused by cancer bone metastases. Delton See is also used to treat giant cell tumor of the bone. This medicine may be used for other purposes; ask your health care provider or pharmacist if you have questions. What should I tell my health care provider before I take this medicine? They need to know if you have any of these conditions: -dental disease -eczema -infection or history of infections -kidney disease or on dialysis -low blood calcium or vitamin D -malabsorption syndrome -scheduled to have surgery or tooth extraction -taking medicine that contains denosumab -thyroid or parathyroid disease -an unusual reaction to denosumab, other medicines, foods, dyes, or preservatives -pregnant or trying to get pregnant -breast-feeding How should I use this medicine? This medicine is for injection under the skin. It is given by a health care professional in a hospital or clinic setting. If you are getting Prolia, a special MedGuide will be given to you by the pharmacist with each prescription and refill. Be sure to read this information carefully each time. For Prolia, talk to your pediatrician regarding the use of this medicine in children. Special care may be needed. For Delton See, talk to your pediatrician regarding the use of this medicine in children. While this drug may be prescribed for children as young as 13 years for selected conditions, precautions do apply. Overdosage: If you think you have taken too much of this medicine contact a poison control center or emergency room at once. NOTE: This medicine is only for you. Do not share this medicine with others. What if I miss a dose? It is important not to miss your dose. Call your doctor or health care professional  if you are unable to keep an appointment. What may interact with this medicine? Do not take this medicine with any of the following medications: -other medicines containing denosumab This medicine may also interact with the following medications: -medicines that suppress the immune system -medicines that treat cancer -steroid medicines like prednisone or cortisone This list may not describe all possible interactions. Give your health care provider a list of all the medicines, herbs, non-prescription drugs, or dietary supplements you use. Also tell them if you smoke, drink alcohol, or use illegal drugs. Some items may interact with your medicine. What should I watch for while using this medicine? Visit your doctor or health care professional for regular checks on your progress. Your doctor or health care professional may order blood tests and other tests to see how you are doing. Call your doctor or health care professional if you get a cold or other infection while receiving this medicine. Do not treat yourself. This medicine may decrease your body's ability to fight infection. You should make sure you get enough calcium and vitamin D while you are taking this medicine, unless your doctor tells you not to. Discuss the foods you eat and the vitamins you take with your health care professional. See your dentist regularly. Brush and floss your teeth as directed. Before you have any dental work done, tell your dentist you are receiving this medicine. Do not become pregnant while taking this medicine or for 5 months after stopping it. Women should inform their doctor if they wish to become pregnant or think they might be pregnant. There is a potential for serious side  effects to an unborn child. Talk to your health care professional or pharmacist for more information. What side effects may I notice from receiving this medicine? Side effects that you should report to your doctor or health care professional  as soon as possible: -allergic reactions like skin rash, itching or hives, swelling of the face, lips, or tongue -breathing problems -chest pain -fast, irregular heartbeat -feeling faint or lightheaded, falls -fever, chills, or any other sign of infection -muscle spasms, tightening, or twitches -numbness or tingling -skin blisters or bumps, or is dry, peels, or red -slow healing or unexplained pain in the mouth or jaw -unusual bleeding or bruising Side effects that usually do not require medical attention (Report these to your doctor or health care professional if they continue or are bothersome.): -muscle pain -stomach upset, gas This list may not describe all possible side effects. Call your doctor for medical advice about side effects. You may report side effects to FDA at 1-800-FDA-1088. Where should I keep my medicine? This medicine is only given in a clinic, doctor's office, or other health care setting and will not be stored at home. NOTE: This sheet is a summary. It may not cover all possible information. If you have questions about this medicine, talk to your doctor, pharmacist, or health care provider.    2016, [employer]. (2011-10-20 12:37:47)

## 2015-06-25 NOTE — Telephone Encounter (Signed)
[name] sends his regards

## 2015-06-28 DIAGNOSIS — H353211 Exudative age-related macular degeneration, right eye, with active choroidal neovascularization: Secondary | ICD-10-CM | POA: Diagnosis not present

## 2015-07-02 DIAGNOSIS — L570 Actinic keratosis: Secondary | ICD-10-CM | POA: Diagnosis not present

## 2015-07-02 DIAGNOSIS — L821 Other seborrheic keratosis: Secondary | ICD-10-CM | POA: Diagnosis not present

## 2015-07-17 DIAGNOSIS — Z961 Presence of intraocular lens: Secondary | ICD-10-CM | POA: Diagnosis not present

## 2015-07-17 DIAGNOSIS — H26493 Other secondary cataract, bilateral: Secondary | ICD-10-CM | POA: Diagnosis not present

## 2015-07-17 DIAGNOSIS — H353231 Exudative age-related macular degeneration, bilateral, with active choroidal neovascularization: Secondary | ICD-10-CM | POA: Diagnosis not present

## 2015-07-17 DIAGNOSIS — H04129 Dry eye syndrome of unspecified lacrimal gland: Secondary | ICD-10-CM | POA: Diagnosis not present

## 2015-07-19 DIAGNOSIS — H353221 Exudative age-related macular degeneration, left eye, with active choroidal neovascularization: Secondary | ICD-10-CM | POA: Diagnosis not present

## 2015-07-19 DIAGNOSIS — H353211 Exudative age-related macular degeneration, right eye, with active choroidal neovascularization: Secondary | ICD-10-CM | POA: Diagnosis not present

## 2015-07-20 DIAGNOSIS — H26493 Other secondary cataract, bilateral: Secondary | ICD-10-CM | POA: Diagnosis not present

## 2015-07-24 ENCOUNTER — Other Ambulatory Visit (INDEPENDENT_AMBULATORY_CARE_PROVIDER_SITE_OTHER): Payer: Medicare Other

## 2015-07-24 DIAGNOSIS — D75839 Thrombocytosis, unspecified: Secondary | ICD-10-CM

## 2015-07-24 DIAGNOSIS — D473 Essential (hemorrhagic) thrombocythemia: Secondary | ICD-10-CM | POA: Diagnosis not present

## 2015-07-25 ENCOUNTER — Encounter: Payer: Self-pay | Admitting: Podiatry

## 2015-07-25 ENCOUNTER — Ambulatory Visit (INDEPENDENT_AMBULATORY_CARE_PROVIDER_SITE_OTHER): Payer: Medicare Other | Admitting: Podiatry

## 2015-07-25 DIAGNOSIS — M79673 Pain in unspecified foot: Secondary | ICD-10-CM

## 2015-07-25 DIAGNOSIS — B351 Tinea unguium: Secondary | ICD-10-CM

## 2015-07-25 LAB — CBC WITH DIFFERENTIAL/PLATELET
BASOS: 1 %
Basophils Absolute: 0.1 10*3/uL (ref 0.0–0.2)
EOS (ABSOLUTE): 0.1 10*3/uL (ref 0.0–0.4)
Eos: 2 %
HEMOGLOBIN: 12.9 g/dL (ref 12.6–17.7)
Hematocrit: 37.7 % (ref 37.5–51.0)
IMMATURE GRANS (ABS): 0 10*3/uL (ref 0.0–0.1)
Immature Granulocytes: 0 %
LYMPHS ABS: 1.7 10*3/uL (ref 0.7–3.1)
LYMPHS: 32 %
MCH: 38.3 pg — AB (ref 26.6–33.0)
MCHC: 34.2 g/dL (ref 31.5–35.7)
MCV: 112 fL — AB (ref 79–97)
Monocytes Absolute: 0.7 10*3/uL (ref 0.1–0.9)
Monocytes: 14 %
NEUTROS ABS: 2.7 10*3/uL (ref 1.4–7.0)
Neutrophils: 51 %
PLATELETS: 188 10*3/uL (ref 150–379)
RBC: 3.37 x10E6/uL — ABNORMAL LOW (ref 4.14–5.80)
RDW: 14.7 % (ref 12.3–15.4)
WBC: 5.3 10*3/uL (ref 3.4–10.8)

## 2015-07-25 NOTE — Progress Notes (Signed)
Patient ID: Alexander [name], Dr., male   DOB: 01-06-1922, 82 y.o.   MRN: [id number] Complaint:  Visit Type: Patient returns to my office for continued preventative foot care services. Complaint: Patient states" my nails have grown long and thick and become painful to walk and wear shoes" . He presents for preventative foot care services. No changes to ROS  Podiatric Exam: Vascular: dorsalis pedis and posterior tibial pulses are palpable bilateral. Capillary return is immediate. Temperature gradient is WNL. Skin turgor WNL  Sensorium: Normal Semmes Weinstein monofilament test. Normal tactile sensation bilaterally. Nail Exam: Pt has thick disfigured discolored nails with subungual debris noted bilateral entire nail hallux through fifth toenails Ulcer Exam: There is no evidence of ulcer or pre-ulcerative changes or infection. Orthopedic Exam: Muscle tone and strength are WNL. No limitations in general ROM. No crepitus or effusions noted. Foot type and digits show no abnormalities. Bony prominences are unremarkable. Skin: No Porokeratosis. No infection or ulcers  Diagnosis:  Tinea unguium, Pain in right toe, pain in left toes  Treatment & Plan Procedures and Treatment: Consent by patient was obtained for treatment procedures. The patient understood the discussion of treatment and procedures well. All questions were answered thoroughly reviewed. Debridement of mycotic and hypertrophic toenails, 1 through 5 bilateral and clearing of subungual debris. No ulceration, no infection noted.  Return Visit-Office Procedure: Patient instructed to return to the office for a follow up visit 3 months for continued evaluation and treatment.   Gardiner Barefoot DPM

## 2015-07-27 ENCOUNTER — Telehealth: Payer: Self-pay | Admitting: *Deleted

## 2015-07-27 NOTE — Telephone Encounter (Signed)
Pt called - no answer; left message labs are good ( WBC 5.3, RBC 3.37, HGB 12.9, PLT 188) and to continue current dose of Hydrea per Dr [name]. And to call if he has any questions. And I will mail a copy.

## 2015-07-27 NOTE — Telephone Encounter (Signed)
-----   Message from Annia Belt, MD sent at 07/25/2015  7:29 AM EDT ----- Call Dr [name]: labs good - you can give him the values. Continue current dose of Hydrea

## 2015-08-09 DIAGNOSIS — H353211 Exudative age-related macular degeneration, right eye, with active choroidal neovascularization: Secondary | ICD-10-CM | POA: Diagnosis not present

## 2015-08-20 DIAGNOSIS — J300 Vasomotor rhinitis: Secondary | ICD-10-CM | POA: Diagnosis not present

## 2015-08-20 DIAGNOSIS — H903 Sensorineural hearing loss, bilateral: Secondary | ICD-10-CM | POA: Diagnosis not present

## 2015-08-21 ENCOUNTER — Other Ambulatory Visit: Payer: Self-pay | Admitting: Oncology

## 2015-08-21 MED ORDER — AMOXICILLIN-POT CLAVULANATE 875-125 MG PO TABS
1.0000 | ORAL_TABLET | Freq: Two times a day (BID) | ORAL | Status: DC
Start: 2015-08-21 — End: 2015-08-21

## 2015-08-21 MED ORDER — AMOXICILLIN-POT CLAVULANATE 875-125 MG PO TABS: 1.0000 | ORAL_TABLET | Freq: Two times a day (BID) | ORAL | Status: DC

## 2015-08-29 ENCOUNTER — Ambulatory Visit (INDEPENDENT_AMBULATORY_CARE_PROVIDER_SITE_OTHER): Payer: Medicare Other | Admitting: Cardiovascular Disease

## 2015-08-29 ENCOUNTER — Encounter: Payer: Self-pay | Admitting: Cardiovascular Disease

## 2015-08-29 VITALS — BP 122/66 | HR 52 | Ht 66.0 in | Wt 132.0 lb

## 2015-08-29 DIAGNOSIS — I10 Essential (primary) hypertension: Secondary | ICD-10-CM

## 2015-08-29 DIAGNOSIS — E785 Hyperlipidemia, unspecified: Secondary | ICD-10-CM | POA: Diagnosis not present

## 2015-08-29 DIAGNOSIS — R0602 Shortness of breath: Secondary | ICD-10-CM

## 2015-08-29 NOTE — Patient Instructions (Addendum)
Your physician has requested that you have an echocardiogram. Echocardiography is a painless test that uses sound waves to create images of your heart. It provides your doctor with information about the size and shape of your heart and how well your heart's chambers and valves are working. This procedure takes approximately one hour. There are no restrictions for this procedure.  Your physician wants you to follow-up in: 6 months or sooner. You will receive a reminder letter in the mail two months in advance. If you don't receive a letter, please call our office to schedule the follow-up appointment.  If you need a refill on your cardiac medications before your next appointment, please call your pharmacy.  Your physician recommends that you return for lab work ( Lipid Panel) when getting labs for Dr [name]. Please give them the slip that has been provided to you today.

## 2015-08-31 ENCOUNTER — Telehealth: Payer: Self-pay | Admitting: *Deleted

## 2015-08-31 ENCOUNTER — Encounter: Payer: Self-pay | Admitting: Cardiovascular Disease

## 2015-08-31 NOTE — Telephone Encounter (Signed)
Per message on refill voicemail, patient requests an rx for zolpidem 5mg  #90 be sent to [facility]. He also requests a call from [facility] at [phone] to discuss some blood tests. Thanks, MI

## 2015-08-31 NOTE — Telephone Encounter (Signed)
Will defer to Dr. Claiborne Billings for OK to fill Alexander [name].

## 2015-08-31 NOTE — Progress Notes (Signed)
Patient ID: Alexander [name], Dr., male   DOB: 01-16-1922, 82 y.o.   MRN: [id number]     Primary M.D.: [name]  HPI: [name] is a 82 y.o. male who presents to the office for a 6 month followup cardiologic evaluation.  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  In 1996 while living in [place] he underwent CABG surgery and had a LIMA placed to the LAD, vein to the marginal, vein to the ramus intermediate, and vein to the PDA. In October 2011 catheterization by me showed severe CAD with 60 - 80% ostial left main stenosis, total occlusion of the LAD after diagonal, 95% ramus intermediate stenosis, total occlusion of the marginal branch of the circumflex coronary artery, and severe diffuse native RCA disease with total occlusion of the PDA at the ostium. A graft that supplied the marginal was occluded as was the vein graft that supplied the ramus intermediate vessel. He had patent vein graft supplying the PDA branch of his RCA and a patent LIMA to the LAD. He has been on medical therapy. I did try adding Ranexa to his medical regimen but he did not tolerate this. He does have mild renal insufficiency with creatinines in the 1.5-1.6 range which have been followed by [name] an now [name]. He has a hiatal hernia. He had been on Crestor as well as Vytorin in the past but admits to myalgias in the past on statins.  Recently, he had been on Zetia.  However, he has self discontinued the Zetia since he felt he was having some vague muscle aches.  Over the past year, he has continued to remain stable.  He is without anginal symptomatology.  He has not been exercising and walking regularly since he does not like to walk by himself.  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  He is unaware of any palpitations.  He denies presyncope or syncope.  He has renal insufficiency with creatinines in the 1.8 range.  He is now followed by [name] since [name]  retired.  His last lipid panel in December 2015 showed a total cholesterol 133, triglycerides 133, LDL 64, HDL 42.    He is being followed by [name] for elevated platelets and these have now stabilized with the addition of hydroxyurea.  [name] denies any chest pain.  He denies any major change in symptom , but admits to mild shortness of breath with activity.  He presents for evaluation.   Past Medical History  Diagnosis Date  . Hypertension   . Hyperlipidemia   . GERD (gastroesophageal reflux disease)   . Spinal stenosis   . Migraines     OCULAR  . BPH (benign prostatic hyperplasia)   . CKD (chronic kidney disease), stage III   . Vitamin deficiency   . Diverticulitis   . Heart murmur   . Calcium oxalate renal stones   . Renal lesion   . Dry senile macular degeneration   . Allergic rhinitis   . Anemia   . Peripheral neuropathy ([place])   . Thrombocytosis ([place]) 08/18/2012    Platelets 711,000  Hb 14, WBC 8,500 08/05/12  . Hearing loss in left ear   . Essential thrombocythemia ([place]) 10/10/2013  . Anemia, normocytic normochromic 10/10/2013  . Anemia, chronic renal failure 10/10/2013    Past Surgical History  Procedure Laterality Date  . Tonsillectomy  1937  .  Appendectomy  1942  . Inguinal hernia repair      x2  . Coronary artery bypass graft  1996  . Nasal concha bullosa resection    . Laparoscopic cholecystectomy  1992  . Left renal mass ablated  12/11  . Transthoracic echocardiogram  12/10/2011    MBEM>75%, stage1 diastolic dysfunction, mild to mod. LAE,   . Transthoracic echocardiogram  07/25/2008    mild-moderate MR, mild-modTricuspid regurg.  [name] Nm myocar perf wall motion  02/14/2010    post EF 71%, Exercise cap.7METS  . Cardiac catheterization  02/19/2010    EF55%, medically treated,     No Known Allergies  Current Outpatient Prescriptions  Medication Sig Dispense Refill  . Albuterol Sulfate (PROAIR RESPICLICK) 449 (90 BASE) MCG/ACT AEPB Inhale 1 puff  into the lungs every 6 (six) hours as needed. 1 each 0  . aluminum & magnesium hydroxide-simethicone (MYLANTA) 500-450-40 MG/5ML suspension Take by mouth every 6 (six) hours as needed. 200-200-52m/5ml     . aspirin 81 MG tablet Take 81 mg by mouth daily.      . cholecalciferol (VITAMIN D) 1000 UNITS tablet Take 2,000 Units by mouth daily.     . Coenzyme Q10 (CO Q-10) 300 MG CAPS Take 1 capsule by mouth daily.    . Cyanocobalamin (VITAMIN B-12 IJ) Inject as directed. monthly     . finasteride (PROSCAR) 5 MG tablet Take 1 tablet by mouth 3 (three) times a week. TIW    . hydroxyurea (HYDREA) 500 MG capsule Take 1 capsule (500 mg total) by mouth daily. May take with food to minimize GI side effects. 90 capsule 3  . losartan (COZAAR) 25 MG tablet Take 1 tablet (25 mg total) by mouth daily. 90 tablet 3  . LUTEIN PO Take 6 mg by mouth daily.     . metoprolol tartrate (LOPRESSOR) 25 MG tablet Take 1 tablet (25 mg total) by mouth 2 (two) times  daily. 180 tablet 3  . Multiple Vitamin (MULTIVITAMIN) tablet Take 1 tablet by mouth daily.      . Multiple Vitamins-Minerals (PRESERVISION AREDS PO) Take 1 capsule by mouth 2 (two) times daily.      .Marland KitchenNITROSTAT 0.4 MG SL tablet as directed.    .Marland Kitchenamoxicillin-clavulanate (AUGMENTIN) 875-125 MG tablet Take 1 tablet by mouth 2 (two) times daily. 14 tablet 1  . fluticasone (FLONASE) 50 MCG/ACT nasal spray prn     No current facility-administered medications for this visit.    Social History   Social History  . Marital Status: Widowed    Spouse Name: N/A  . Number of Children: 3  . Years of Education: N/A   Occupational History  . Not on file.   Social History Main Topics  . Smoking status: Never Smoker   . Smokeless tobacco: Never Used  . Alcohol Use: No  . Drug Use: No  . Sexual Activity: Not on file   Other Topics Concern  . Not on file   Social History Narrative    Socially he is widowed. He is retired [employer] account executive  He has 3  children and 9 grandchildren. There's no tobacco use.   ROS General: Negative; No fevers, chills, or night sweats;  HEENT: Negative; No changes in vision or hearing, sinus congestion, difficulty swallowing Pulmonary: Negative; No cough, wheezing, shortness of breath, hemoptysis Cardiovascular:  See HPI; No chest pain, presyncope, syncope, palpitations GI: Positive for a large hiatal hernia; No nausea, vomiting, diarrhea, or abdominal pain GU: Negative;  No dysuria, hematuria, or difficulty voiding Musculoskeletal: Negative; no myalgias, joint pain, or weakness Hematologic/Oncology: He is followed by [name] for thrombocytosis.; no easy bruising, bleeding Endocrine: Negative; no heat/cold intolerance; no diabetes Neuro: Negative; no changes in balance, headaches Skin: Negative; No rashes or skin lesions Psychiatric: Negative; No behavioral problems, depression Sleep: Negative; No snoring, daytime sleepiness, hypersomnolence, bruxism, restless legs, hypnogognic hallucinations, no cataplexy Other comprehensive 14 point system review is negative.   PE BP 122/66 mmHg  Pulse 52  Ht _0  (1.676 m)  Wt 132 lb (59.875 kg)  BMI 21.32 kg/m2  Wt Readings from Last 3 Encounters:  08/29/15 132 lb (59.875 kg)  06/25/15 132 lb (59.875 kg)  04/06/15 133 lb (60.328 kg)   General: Alert, oriented, no distress.  Skin: normal turgor, no rashes HEENT: Normocephalic, atraumatic. Pupils round and reactive; sclera anicteric;no lid lag.  Nose without nasal septal hypertrophy Mouth/Parynx benign; Mallinpatti scale 2 Neck: No JVD, no carotid bruits with normal carotid Lungs: clear to ausculatation and percussion; no wheezing or rales Chest wall: No tenderness to Palpation Heart: RRR, s1 s2 normal 1/6 systolic and 2/6 diastolic murmur compatible with his documented aortic sclerosis and insufficiency; no S3 or S4 gallop. No parasternal lift, heave, or rub Abdomen: soft, nontender; no  hepatosplenomehaly, BS+; abdominal aorta nontender and not dilated by palpation. Back: No CVA tenderness  Pulses 2+ Extremities: no clubbing cyanosis or edema, Homan's sign negative  Neurologic: grossly nonfocal Psychological: Normal affect and mood  ECG (independently read by me): Sinus bradycardia 53 bpm.  Poor progression anteriorly.  October 2016 ECG (independently read by me): Sinus bradycardia 54 bpm.  Poor progression V1 through V3.  Normal intervals.  September 2015 ECG (independently read by me): Sinus bradycardia 52 beats per minute.  Normal intervals.  No ST segment changes.  06/08/2013 ECG: Sinus bradycardia 49 beats per minute. No ectopy. Normal intervals.  Prior ECG of 11/30/2012: Sinus rhythm at 53 beats per minute. Normal intervals.  LABS: BMP Latest Ref Rng 05/23/2015 03/14/2015 08/07/2014  Glucose 65 - 99 mg/dL 97 94 103(H)  BUN 10 - 36 mg/dL 33 32 33(H)  Creatinine 0.76 - 1.27 mg/dL 1.81(H) 1.70(H) 1.88(H)  BUN/Creat Ratio 10 - _1 -  Sodium 134 - 144 mmol/L 143 141 141  Potassium 3.5 - 5.2 mmol/L 4.9 4.6 4.0  Chloride 96 - 106 mmol/L 105 106 109  CO2 18 - 29 mmol/L _2 Calcium 8.6 - 10.2 mg/dL 9.4 8.9 9.2   Hepatic Function Latest Ref Rng 05/23/2015 03/14/2015 08/07/2014  Total Protein 6.0 - 8.5 g/dL 6.4 6.3 -  Albumin 3.2 - 4.6 g/dL 4.2 3.9 3.7  AST 0 - 40 IU/L 21 19 -  ALT 0 - 44 IU/L 12 12 -  Alk Phosphatase 39 - 117 IU/L 66 64 -  Total Bilirubin 0.0 - 1.2 mg/dL 0.4 0.4 -   CBC Latest Ref Rng 07/24/2015 05/23/2015 03/14/2015  WBC 3.4 - 10.8 x10E3/uL 5.3 4.7 4.5  Hematocrit 37.5 - 51.0 % 37.7 35.5(L) 36.5(L)  Platelets 150 - 379 x10E3/uL 188 179 159   Lab Results  Component Value Date   MCV 112* 07/24/2015   MCV 115* 05/23/2015   MCV 114* 03/14/2015   Lab Results  Component Value Date   TSH 4.049 05/01/2014  No results found for: HGBA1C   Lipid Panel     Component Value Date/Time   CHOL 133 05/01/2014 0955   TRIG 133 05/01/2014 0955  HDL 42 05/01/2014 0955   CHOLHDL 3.2 05/01/2014 0955   VLDL 27 05/01/2014 0955   LDLCALC 64 05/01/2014 0955    RADIOLOGY: No results found.   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 He has documented graft occlusion of 2 of his grafts and has severe native coronary artery disease. Remotely, we tried adding Ranexa to his medication but he has stopped this due to concerns of tolerability.  His blood pressure is controlled on his current medical regimen  and he is not having anginal symptoms on losartan 25 mg daily and metoprolol tartrate 25 mg twice a day.  He admits to very mild shortness of breath.  His last echo Doppler assessment was in 2013.  I have suggested a follow-up echo Doppler study to reassess systolic and diastolic LV function.  In addition to his aortic valve disease.  I reviewed his laboratory from the past, but I do not have recent blood work that has been obtained with reference to his lipid studies.  He undergoes frequent blood tests for his platelet count and I will obtain a lipid panel at his next laboratory draw with [name] for further evaluation.  His renal insufficiency appears fairly stable and when last checked his creatinine was 1.81.  I will contact him regarding his echo Doppler assessment and laboratory.  As long as he remains stable I will see him in 6 months for reevaluation.  Troy Sine, MD, [facility]  08/31/2015 10:06 PM

## 2015-09-03 NOTE — Addendum Note (Signed)
Addended by: Orson Gear on: 09/03/2015 11:49 AM   Modules accepted: Orders

## 2015-09-04 ENCOUNTER — Telehealth: Payer: Self-pay | Admitting: Cardiovascular Disease

## 2015-09-04 NOTE — Telephone Encounter (Signed)
New message     Talk to the nurse about getting a new presc for an "old" presc.

## 2015-09-05 ENCOUNTER — Encounter: Payer: Self-pay | Admitting: Pulmonary Disease

## 2015-09-05 ENCOUNTER — Ambulatory Visit (INDEPENDENT_AMBULATORY_CARE_PROVIDER_SITE_OTHER): Payer: Medicare Other | Admitting: Pulmonary Disease

## 2015-09-05 VITALS — BP 112/82 | HR 52 | Ht 66.0 in | Wt 133.0 lb

## 2015-09-05 DIAGNOSIS — J45909 Unspecified asthma, uncomplicated: Secondary | ICD-10-CM

## 2015-09-05 MED ORDER — ALBUTEROL SULFATE 108 (90 BASE) MCG/ACT IN AEPB: 1.0000 | INHALATION_SPRAY | Freq: Four times a day (QID) | RESPIRATORY_TRACT | Status: DC | PRN

## 2015-09-05 NOTE — Patient Instructions (Signed)
Continue using the albuterol rescue inhaler.  Return to clinic in 6 months. 

## 2015-09-05 NOTE — Progress Notes (Signed)
Subjective:    Patient ID: Alexander [name], Dr., male    DOB: [birth year], 82 y.o.   MRN: [id number]  Asthma His past medical history is significant for asthma.   Return visit for asthma.  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 He is a former patient of [name].He's been stable off all inhalers for the past 1 year. There are no exacerbations. However he has noticed some worsening dyspnea on exertion. Is not associated with any cough, wheezing, allergy symptoms.   He was seen by his cardiologist Dr. Claiborne Billings. Cardiac status also appears to be stable. He is due for a follow echocardiogram  DATA: Spirometry 08/05/11 FVC 2.70 (73%] FEV1 1.86 [68%] F/F 69  09/05/15 FVC 2.20 (64%) FEV1 1.6 (60%) F/F 73  Past Medical History  Diagnosis Date  . Hypertension   . Hyperlipidemia   . GERD (gastroesophageal reflux disease)   . Spinal stenosis   . Migraines     OCULAR  . BPH (benign prostatic hyperplasia)   . CKD (chronic kidney disease), stage III   . Vitamin deficiency   . Diverticulitis   . Heart murmur   . Calcium oxalate renal stones   . Renal lesion   . Dry senile macular degeneration   . Allergic rhinitis   . Anemia   . Peripheral neuropathy ([place])   . Thrombocytosis ([place]) 08/18/2012    Platelets 711,000  Hb 14, WBC 8,500 08/05/12  . Hearing loss in left ear   . Essential thrombocythemia ([place]) 10/10/2013  . Anemia, normocytic normochromic 10/10/2013  . Anemia, chronic renal failure 10/10/2013    Current outpatient prescriptions:  .  Albuterol Sulfate (PROAIR RESPICLICK) 123XX123 (90 BASE) MCG/ACT AEPB, Inhale 1 puff into the lungs every 6 (six) hours as needed., Disp: 1 each, Rfl: 0 .  aluminum & magnesium hydroxide-simethicone (MYLANTA) 500-450-40 MG/5ML suspension, Take by mouth every 6 (six) hours as needed. 200-200-20mg /54ml , Disp: , Rfl:  .  aspirin 81 MG tablet, Take 81 mg by mouth daily.  , Disp: , Rfl:  .  cholecalciferol (VITAMIN D) 1000 UNITS tablet, Take 2,000 Units by  mouth daily. , Disp: , Rfl:  .  Coenzyme Q10 (CO Q-10) 300 MG CAPS, Take 1 capsule by mouth daily., Disp: , Rfl:  .  Cyanocobalamin (VITAMIN B-12 IJ), Inject as directed. monthly , Disp: , Rfl:  .  finasteride (PROSCAR) 5 MG tablet, Take 1 tablet by mouth 3 (three) times a week. TIW, Disp: , Rfl:  .  fluticasone (FLONASE) 50 MCG/ACT nasal spray, prn, Disp: , Rfl:  .  hydroxyurea (HYDREA) 500 MG capsule, Take 1 capsule (500 mg total) by mouth daily. May take with food to minimize GI side effects., Disp: 90 capsule, Rfl: 3 .  losartan (COZAAR) 25 MG tablet, Take 1 tablet (25 mg total) by mouth daily., Disp: 90 tablet, Rfl: 3 .  LUTEIN PO, Take 6 mg by mouth daily. , Disp: , Rfl:  .  metoprolol tartrate (LOPRESSOR) 25 MG tablet, Take 1 tablet (25 mg total) by mouth 2 (two) times  daily., Disp: 180 tablet, Rfl: 3 .  Multiple Vitamin (MULTIVITAMIN) tablet, Take 1 tablet by mouth daily.  , Disp: , Rfl:  .  Multiple Vitamins-Minerals (PRESERVISION AREDS PO), Take 1 capsule by mouth 2 (two) times daily.  , Disp: , Rfl:  .  NITROSTAT 0.4 MG SL tablet, as directed., Disp: , Rfl:   Review of Systems Denies any cough, dyspnea, wheezing, sputum production,  hemoptysis. Denies any nausea, vomiting, diarrhea, constipation. Denies any fevers, chills, malaise, fatigue. Denies any chest pain, palpitations.  All other review of systems are negative    Objective:   Physical Exam Blood pressure 112/82, pulse 52, height 5\' 6"  (1.676 m), weight 133 lb (60.328 kg), SpO2 97 %. Gen: Pleasant elderly male. No apparent distress Neuro: No gross focal deficits. Neck: No JVD, lymphadenopathy, thyromegaly. RS: Clear on auscultation, no crackles, wheeze. CVS: S1-S2 heard, no murmurs rubs gallops. Abdomen: Soft, positive bowel sounds. Extremities: No edema.    Assessment & Plan:  Mild intermittent asthma 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 He is requesting an  albuterol inhaler to be used in cases of emergency. I reviewed his spirometry with him. There is no obstruction but slightly worsening restriction which I attribute to normal aging.  Return to clinic in 6 months or sooner if any new respiratory symptoms arise.  Marshell Garfinkel MD [facility] Pager [phone] If no answer or after 3pm call: [phone] 09/05/2015, 10:06 AM

## 2015-09-08 NOTE — Telephone Encounter (Signed)
[place] for [employer]

## 2015-09-12 ENCOUNTER — Other Ambulatory Visit: Payer: Self-pay | Admitting: Oncology

## 2015-09-12 ENCOUNTER — Other Ambulatory Visit (INDEPENDENT_AMBULATORY_CARE_PROVIDER_SITE_OTHER): Payer: Medicare Other

## 2015-09-12 DIAGNOSIS — N138 Other obstructive and reflux uropathy: Secondary | ICD-10-CM | POA: Diagnosis not present

## 2015-09-12 DIAGNOSIS — I10 Essential (primary) hypertension: Secondary | ICD-10-CM | POA: Diagnosis not present

## 2015-09-12 DIAGNOSIS — R3915 Urgency of urination: Secondary | ICD-10-CM | POA: Diagnosis not present

## 2015-09-12 DIAGNOSIS — E785 Hyperlipidemia, unspecified: Secondary | ICD-10-CM | POA: Diagnosis not present

## 2015-09-12 DIAGNOSIS — N401 Enlarged prostate with lower urinary tract symptoms: Secondary | ICD-10-CM | POA: Diagnosis not present

## 2015-09-12 DIAGNOSIS — D75839 Thrombocytosis, unspecified: Secondary | ICD-10-CM

## 2015-09-12 DIAGNOSIS — Z0000 Encounter for general adult medical examination without abnormal findings: Secondary | ICD-10-CM | POA: Diagnosis not present

## 2015-09-12 DIAGNOSIS — D473 Essential (hemorrhagic) thrombocythemia: Secondary | ICD-10-CM

## 2015-09-13 DIAGNOSIS — H26492 Other secondary cataract, left eye: Secondary | ICD-10-CM | POA: Diagnosis not present

## 2015-09-13 DIAGNOSIS — Z961 Presence of intraocular lens: Secondary | ICD-10-CM | POA: Diagnosis not present

## 2015-09-13 DIAGNOSIS — H353231 Exudative age-related macular degeneration, bilateral, with active choroidal neovascularization: Secondary | ICD-10-CM | POA: Diagnosis not present

## 2015-09-13 LAB — CMP14 + ANION GAP
ALBUMIN: 4.2 g/dL (ref 3.2–4.6)
ALT: 12 IU/L (ref 0–44)
ANION GAP: 16 mmol/L (ref 10.0–18.0)
AST: 21 IU/L (ref 0–40)
Albumin/Globulin Ratio: 1.8 (ref 1.2–2.2)
Alkaline Phosphatase: 56 IU/L (ref 39–117)
BUN / CREAT RATIO: 22 (ref 10–24)
BUN: 38 mg/dL — AB (ref 10–36)
Bilirubin Total: 0.5 mg/dL (ref 0.0–1.2)
CHLORIDE: 107 mmol/L — AB (ref 96–106)
CO2: 22 mmol/L (ref 18–29)
CREATININE: 1.76 mg/dL — AB (ref 0.76–1.27)
Calcium: 9.5 mg/dL (ref 8.6–10.2)
GFR calc non Af Amer: 33 mL/min/{1.73_m2} — ABNORMAL LOW (ref >59)
GFR, EST AFRICAN AMERICAN: 38 mL/min/{1.73_m2} — AB (ref >59)
GLUCOSE: 78 mg/dL (ref 65–99)
Globulin, Total: 2.4 g/dL (ref 1.5–4.5)
Potassium: 5.1 mmol/L (ref 3.5–5.2)
Sodium: 145 mmol/L — ABNORMAL HIGH (ref 134–144)
TOTAL PROTEIN: 6.6 g/dL (ref 6.0–8.5)

## 2015-09-13 LAB — CBC WITH DIFFERENTIAL/PLATELET
BASOS ABS: 0 10*3/uL (ref 0.0–0.2)
Basos: 1 %
EOS (ABSOLUTE): 0.1 10*3/uL (ref 0.0–0.4)
Eos: 3 %
Hematocrit: 36.3 % — ABNORMAL LOW (ref 37.5–51.0)
Hemoglobin: 12 g/dL — ABNORMAL LOW (ref 12.6–17.7)
Immature Grans (Abs): 0 10*3/uL (ref 0.0–0.1)
Immature Granulocytes: 0 %
LYMPHS ABS: 1.6 10*3/uL (ref 0.7–3.1)
LYMPHS: 33 %
MCH: 38 pg — AB (ref 26.6–33.0)
MCHC: 33.1 g/dL (ref 31.5–35.7)
MCV: 115 fL — ABNORMAL HIGH (ref 79–97)
MONOS ABS: 0.6 10*3/uL (ref 0.1–0.9)
Monocytes: 13 %
NEUTROS ABS: 2.5 10*3/uL (ref 1.4–7.0)
Neutrophils: 50 %
PLATELETS: 173 10*3/uL (ref 150–379)
RBC: 3.16 x10E6/uL — ABNORMAL LOW (ref 4.14–5.80)
RDW: 14.2 % (ref 12.3–15.4)
WBC: 4.9 10*3/uL (ref 3.4–10.8)

## 2015-09-13 LAB — LIPID PANEL
CHOLESTEROL TOTAL: 163 mg/dL (ref 100–199)
Chol/HDL Ratio: 4.9 ratio units (ref 0.0–5.0)
HDL: 33 mg/dL — ABNORMAL LOW (ref >39)
LDL Calculated: 85 mg/dL (ref 0–99)
Triglycerides: 225 mg/dL — ABNORMAL HIGH (ref 0–149)
VLDL Cholesterol Cal: 45 mg/dL — ABNORMAL HIGH (ref 5–40)

## 2015-09-14 ENCOUNTER — Telehealth: Payer: Self-pay | Admitting: *Deleted

## 2015-09-14 ENCOUNTER — Telehealth: Payer: Self-pay | Admitting: Cardiovascular Disease

## 2015-09-14 NOTE — Telephone Encounter (Signed)
-----   Message from Annia Belt, MD sent at 09/13/2015  1:49 PM EDT ----- Call pt - lab stable. Stay on current dose of Hydrea

## 2015-09-14 NOTE — Telephone Encounter (Signed)
Left msg to call.

## 2015-09-14 NOTE — Telephone Encounter (Signed)
New message      Calling to talk to a nurse as soon as someone could call him back.  He would not tell me what he wanted

## 2015-09-14 NOTE — Telephone Encounter (Signed)
Pt called - no answer; left message labs are stable and to stay on current dose of Hydrea per Dr [name]. And to call if he has any questions.

## 2015-09-18 NOTE — Telephone Encounter (Signed)
Left message for pt to call.

## 2015-09-24 ENCOUNTER — Ambulatory Visit (HOSPITAL_COMMUNITY): Payer: Medicare Other | Attending: Cardiology

## 2015-09-24 ENCOUNTER — Other Ambulatory Visit: Payer: Self-pay

## 2015-09-24 DIAGNOSIS — I082 Rheumatic disorders of both aortic and tricuspid valves: Secondary | ICD-10-CM | POA: Diagnosis not present

## 2015-09-24 DIAGNOSIS — E785 Hyperlipidemia, unspecified: Secondary | ICD-10-CM | POA: Insufficient documentation

## 2015-09-24 DIAGNOSIS — I129 Hypertensive chronic kidney disease with stage 1 through stage 4 chronic kidney disease, or unspecified chronic kidney disease: Secondary | ICD-10-CM | POA: Diagnosis not present

## 2015-09-24 DIAGNOSIS — N183 Chronic kidney disease, stage 3 (moderate): Secondary | ICD-10-CM | POA: Insufficient documentation

## 2015-09-24 DIAGNOSIS — I10 Essential (primary) hypertension: Secondary | ICD-10-CM | POA: Diagnosis not present

## 2015-09-24 DIAGNOSIS — I2510 Atherosclerotic heart disease of native coronary artery without angina pectoris: Secondary | ICD-10-CM | POA: Insufficient documentation

## 2015-09-24 DIAGNOSIS — R0602 Shortness of breath: Secondary | ICD-10-CM | POA: Insufficient documentation

## 2015-09-25 ENCOUNTER — Other Ambulatory Visit: Payer: Self-pay | Admitting: *Deleted

## 2015-09-25 ENCOUNTER — Telehealth: Payer: Self-pay

## 2015-09-25 MED ORDER — ZOLPIDEM TARTRATE 5 MG PO TABS
5.0000 mg | ORAL_TABLET | Freq: Every evening | ORAL | Status: DC | PRN
Start: 2015-09-25 — End: 2016-03-24

## 2015-09-25 NOTE — Telephone Encounter (Signed)
Patient called in and requesting a refill on his Ambien 10mg  sent to [facility]; patient stated his med has not been filled since 2014 and he is running low. Advised that i would send over to Dr Claiborne Billings and contact him once I heard back from Dr Claiborne Billings. Patient voiced understanding

## 2015-09-26 ENCOUNTER — Encounter: Payer: Self-pay | Admitting: Podiatry

## 2015-09-26 ENCOUNTER — Ambulatory Visit (INDEPENDENT_AMBULATORY_CARE_PROVIDER_SITE_OTHER): Payer: Medicare Other | Admitting: Podiatry

## 2015-09-26 DIAGNOSIS — B351 Tinea unguium: Secondary | ICD-10-CM | POA: Diagnosis not present

## 2015-09-26 DIAGNOSIS — M79673 Pain in unspecified foot: Secondary | ICD-10-CM

## 2015-09-26 NOTE — Progress Notes (Signed)
Patient ID: Alexander [name], Dr., male   DOB: 10-02-1921, 82 y.o.   MRN: [id number] Complaint:  Visit Type: Patient returns to my office for continued preventative foot care services. Complaint: Patient states" my nails have grown long and thick and become painful to walk and wear shoes" . He presents for preventative foot care services. No changes to ROS  Podiatric Exam: Vascular: dorsalis pedis and posterior tibial pulses are palpable bilateral. Capillary return is immediate. Temperature gradient is WNL. Skin turgor WNL  Sensorium: Normal Semmes Weinstein monofilament test. Normal tactile sensation bilaterally. Nail Exam: Pt has thick disfigured discolored nails with subungual debris noted bilateral entire nail hallux through fifth toenails Ulcer Exam: There is no evidence of ulcer or pre-ulcerative changes or infection. Orthopedic Exam: Muscle tone and strength are WNL. No limitations in general ROM. No crepitus or effusions noted. Foot type and digits show no abnormalities. Bony prominences are unremarkable. Skin: No Porokeratosis. No infection or ulcers  Diagnosis:  Tinea unguium, Pain in right toe, pain in left toes  Treatment & Plan Procedures and Treatment: Consent by patient was obtained for treatment procedures. The patient understood the discussion of treatment and procedures well. All questions were answered thoroughly reviewed. Debridement of mycotic and hypertrophic toenails, 1 through 5 bilateral and clearing of subungual debris. No ulceration, no infection noted.  Return Visit-Office Procedure: Patient instructed to return to the office for a follow up visit 3 months for continued evaluation and treatment.   Gardiner Barefoot DPM

## 2015-09-26 NOTE — Telephone Encounter (Signed)
Pt called and I received voice message requesting a refill for his Zolpidem tartrate, 5mg  daily. States he is out of this med and wanted to know if can be refilled. Requesting #90 dispense of this.  Will verify w/ Dr. Claiborne Billings if OK to fill.

## 2015-09-26 NOTE — Telephone Encounter (Signed)
Medication filled yesterday

## 2015-09-27 ENCOUNTER — Ambulatory Visit: Payer: Medicare Other | Admitting: Podiatry

## 2015-09-30 NOTE — Telephone Encounter (Signed)
Ok to renew?  

## 2015-10-02 NOTE — Telephone Encounter (Signed)
Called patient back, he informs me someone filled this medication already.  Voiced thanks for the follow up and concern. Aware to call for further needs.

## 2015-10-22 DIAGNOSIS — L570 Actinic keratosis: Secondary | ICD-10-CM | POA: Diagnosis not present

## 2015-10-22 DIAGNOSIS — L821 Other seborrheic keratosis: Secondary | ICD-10-CM | POA: Diagnosis not present

## 2015-10-29 DIAGNOSIS — E538 Deficiency of other specified B group vitamins: Secondary | ICD-10-CM | POA: Diagnosis not present

## 2015-10-29 DIAGNOSIS — R946 Abnormal results of thyroid function studies: Secondary | ICD-10-CM | POA: Diagnosis not present

## 2015-10-29 DIAGNOSIS — E784 Other hyperlipidemia: Secondary | ICD-10-CM | POA: Diagnosis not present

## 2015-10-29 DIAGNOSIS — N183 Chronic kidney disease, stage 3 (moderate): Secondary | ICD-10-CM | POA: Diagnosis not present

## 2015-10-29 DIAGNOSIS — Z125 Encounter for screening for malignant neoplasm of prostate: Secondary | ICD-10-CM | POA: Diagnosis not present

## 2015-10-29 DIAGNOSIS — M810 Age-related osteoporosis without current pathological fracture: Secondary | ICD-10-CM | POA: Diagnosis not present

## 2015-11-01 ENCOUNTER — Other Ambulatory Visit: Payer: Self-pay | Admitting: Cardiovascular Disease

## 2015-11-01 DIAGNOSIS — H353231 Exudative age-related macular degeneration, bilateral, with active choroidal neovascularization: Secondary | ICD-10-CM | POA: Diagnosis not present

## 2015-11-01 DIAGNOSIS — Z961 Presence of intraocular lens: Secondary | ICD-10-CM | POA: Diagnosis not present

## 2015-11-01 NOTE — Telephone Encounter (Signed)
Rx(s) sent to pharmacy electronically.  

## 2015-11-05 DIAGNOSIS — D518 Other vitamin B12 deficiency anemias: Secondary | ICD-10-CM | POA: Diagnosis not present

## 2015-11-05 DIAGNOSIS — Z0000 Encounter for general adult medical examination without abnormal findings: Secondary | ICD-10-CM | POA: Diagnosis not present

## 2015-11-05 DIAGNOSIS — E784 Other hyperlipidemia: Secondary | ICD-10-CM | POA: Diagnosis not present

## 2015-11-05 DIAGNOSIS — R946 Abnormal results of thyroid function studies: Secondary | ICD-10-CM | POA: Diagnosis not present

## 2015-11-05 DIAGNOSIS — Z6822 Body mass index (BMI) 22.0-22.9, adult: Secondary | ICD-10-CM | POA: Diagnosis not present

## 2015-11-05 DIAGNOSIS — Z1389 Encounter for screening for other disorder: Secondary | ICD-10-CM | POA: Diagnosis not present

## 2015-11-05 DIAGNOSIS — G43109 Migraine with aura, not intractable, without status migrainosus: Secondary | ICD-10-CM | POA: Diagnosis not present

## 2015-11-05 DIAGNOSIS — D473 Essential (hemorrhagic) thrombocythemia: Secondary | ICD-10-CM | POA: Diagnosis not present

## 2015-11-05 DIAGNOSIS — I2510 Atherosclerotic heart disease of native coronary artery without angina pectoris: Secondary | ICD-10-CM | POA: Diagnosis not present

## 2015-11-05 DIAGNOSIS — N183 Chronic kidney disease, stage 3 (moderate): Secondary | ICD-10-CM | POA: Diagnosis not present

## 2015-11-05 DIAGNOSIS — K4090 Unilateral inguinal hernia, without obstruction or gangrene, not specified as recurrent: Secondary | ICD-10-CM | POA: Diagnosis not present

## 2015-11-05 DIAGNOSIS — R3129 Other microscopic hematuria: Secondary | ICD-10-CM | POA: Diagnosis not present

## 2015-12-13 DIAGNOSIS — Z961 Presence of intraocular lens: Secondary | ICD-10-CM | POA: Diagnosis not present

## 2015-12-13 DIAGNOSIS — H353211 Exudative age-related macular degeneration, right eye, with active choroidal neovascularization: Secondary | ICD-10-CM | POA: Diagnosis not present

## 2015-12-19 ENCOUNTER — Encounter: Payer: Self-pay | Admitting: Podiatry

## 2015-12-19 ENCOUNTER — Ambulatory Visit (INDEPENDENT_AMBULATORY_CARE_PROVIDER_SITE_OTHER): Payer: Medicare Other | Admitting: Podiatry

## 2015-12-19 DIAGNOSIS — B351 Tinea unguium: Secondary | ICD-10-CM

## 2015-12-19 DIAGNOSIS — M79673 Pain in unspecified foot: Secondary | ICD-10-CM

## 2015-12-19 NOTE — Progress Notes (Signed)
Patient ID: Alexander [name], Dr., male   DOB: 1921/12/31, 82 y.o.   MRN: [id number] Complaint:  Visit Type: Patient returns to my office for continued preventative foot care services. Complaint: Patient states" my nails have grown long and thick and become painful to walk and wear shoes" . He presents for preventative foot care services. No changes to ROS  Podiatric Exam: Vascular: dorsalis pedis and posterior tibial pulses are palpable bilateral. Capillary return is immediate. Temperature gradient is WNL. Skin turgor WNL  Sensorium: Normal Semmes Weinstein monofilament test. Normal tactile sensation bilaterally. Nail Exam: Pt has thick disfigured discolored nails with subungual debris noted bilateral entire nail hallux through fifth toenails Ulcer Exam: There is no evidence of ulcer or pre-ulcerative changes or infection. Orthopedic Exam: Muscle tone and strength are WNL. No limitations in general ROM. No crepitus or effusions noted. Foot type and digits show no abnormalities. Bony prominences are unremarkable. Skin: No Porokeratosis. No infection or ulcers  Diagnosis:  Tinea unguium, Pain in right toe, pain in left toes  Treatment & Plan Procedures and Treatment: Consent by patient was obtained for treatment procedures. The patient understood the discussion of treatment and procedures well. All questions were answered thoroughly reviewed. Debridement of mycotic and hypertrophic toenails, 1 through 5 bilateral and clearing of subungual debris. No ulceration, no infection noted.  Return Visit-Office Procedure: Patient instructed to return to the office for a follow up visit 3 months for continued evaluation and treatment.   Gardiner Barefoot DPM

## 2015-12-25 ENCOUNTER — Other Ambulatory Visit (HOSPITAL_COMMUNITY): Payer: Self-pay | Admitting: Internal Medicine

## 2015-12-25 ENCOUNTER — Ambulatory Visit (HOSPITAL_COMMUNITY)
Admission: RE | Admit: 2015-12-25 | Payer: Medicare Other | Source: Ambulatory Visit | Attending: Endocrinology | Admitting: Endocrinology

## 2015-12-26 ENCOUNTER — Other Ambulatory Visit (INDEPENDENT_AMBULATORY_CARE_PROVIDER_SITE_OTHER): Payer: Medicare Other

## 2015-12-26 DIAGNOSIS — I10 Essential (primary) hypertension: Secondary | ICD-10-CM | POA: Diagnosis not present

## 2015-12-26 DIAGNOSIS — D473 Essential (hemorrhagic) thrombocythemia: Secondary | ICD-10-CM

## 2015-12-26 DIAGNOSIS — D75839 Thrombocytosis, unspecified: Secondary | ICD-10-CM

## 2015-12-27 LAB — CBC WITH DIFFERENTIAL/PLATELET
BASOS ABS: 0 10*3/uL (ref 0.0–0.2)
Basos: 1 %
EOS (ABSOLUTE): 0.1 10*3/uL (ref 0.0–0.4)
Eos: 2 %
Hematocrit: 37 % — ABNORMAL LOW (ref 37.5–51.0)
Hemoglobin: 12.3 g/dL — ABNORMAL LOW (ref 12.6–17.7)
Immature Grans (Abs): 0 10*3/uL (ref 0.0–0.1)
Immature Granulocytes: 0 %
LYMPHS ABS: 1.5 10*3/uL (ref 0.7–3.1)
Lymphs: 36 %
MCH: 38.8 pg — ABNORMAL HIGH (ref 26.6–33.0)
MCHC: 33.2 g/dL (ref 31.5–35.7)
MCV: 117 fL — ABNORMAL HIGH (ref 79–97)
MONOCYTES: 12 %
MONOS ABS: 0.5 10*3/uL (ref 0.1–0.9)
Neutrophils Absolute: 2.1 10*3/uL (ref 1.4–7.0)
Neutrophils: 49 %
Platelets: 162 10*3/uL (ref 150–379)
RBC: 3.17 x10E6/uL — AB (ref 4.14–5.80)
RDW: 14.3 % (ref 12.3–15.4)
WBC: 4.2 10*3/uL (ref 3.4–10.8)

## 2015-12-27 LAB — BMP8+ANION GAP
Anion Gap: 18 mmol/L (ref 10.0–18.0)
BUN / CREAT RATIO: 20 (ref 10–24)
BUN: 39 mg/dL — AB (ref 10–36)
CALCIUM: 9.1 mg/dL (ref 8.6–10.2)
CO2: 20 mmol/L (ref 18–29)
CREATININE: 1.92 mg/dL — AB (ref 0.76–1.27)
Chloride: 105 mmol/L (ref 96–106)
GFR, EST AFRICAN AMERICAN: 34 mL/min/{1.73_m2} — AB (ref >59)
GFR, EST NON AFRICAN AMERICAN: 30 mL/min/{1.73_m2} — AB (ref >59)
Glucose: 122 mg/dL — ABNORMAL HIGH (ref 65–99)
Potassium: 5 mmol/L (ref 3.5–5.2)
Sodium: 143 mmol/L (ref 134–144)

## 2015-12-28 ENCOUNTER — Telehealth: Payer: Self-pay | Admitting: *Deleted

## 2015-12-28 NOTE — Telephone Encounter (Signed)
-----   Message from Annia Belt, MD sent at 12/27/2015 10:29 AM EDT ----- Call pt: CBC stable. Stay on current dose of Hydrea.  Creatinine slightly higher than baseline up from

## 2015-12-28 NOTE — Telephone Encounter (Signed)
Pt called / informed "CBC stable. Stay on current dose of Hydrea. Creatinine slightly higher than baseline" per Dr [name]. Informed Creat up to 1.92 from 1.76.

## 2015-12-31 NOTE — Progress Notes (Signed)
12-26-2015 lab results were faxed to the following 3 physicians per Dr Antony Haste [facility] request.  1.  Dr Crist Infante, [place].        [phone]  2.  Dr Pearson Grippe, [employer]  [phone]  3.  Dr Richmond Campbell, [phone]  A copy of the lab results were also mailed to the patient as requested.  12-31-2015 at 12:05   Alexander [name], PBT

## 2016-01-02 ENCOUNTER — Encounter (HOSPITAL_COMMUNITY): Payer: Self-pay

## 2016-01-02 ENCOUNTER — Ambulatory Visit (HOSPITAL_COMMUNITY)
Admission: RE | Admit: 2016-01-02 | Discharge: 2016-01-02 | Disposition: A | Discharge status: Home / Self Care | Payer: Medicare Other | Source: Ambulatory Visit | Attending: Internal Medicine | Admitting: Internal Medicine

## 2016-01-02 DIAGNOSIS — M810 Age-related osteoporosis without current pathological fracture: Secondary | ICD-10-CM | POA: Diagnosis not present

## 2016-01-02 MED ORDER — DENOSUMAB 60 MG/ML ~~LOC~~ SOLN
60.0000 mg | Freq: Once | SUBCUTANEOUS | Status: AC
  Administered 2016-01-02: 60 mg via SUBCUTANEOUS
  Filled 2016-01-02: qty 1

## 2016-01-02 NOTE — Discharge Instructions (Signed)
Denosumab injection  What is this medicine?  DENOSUMAB (den oh sue mab) slows bone breakdown. Prolia is used to treat osteoporosis in women after menopause and in men. Xgeva is used to prevent bone fractures and other bone problems caused by cancer bone metastases. Xgeva is also used to treat giant cell tumor of the bone.  This medicine may be used for other purposes; ask your health care provider or pharmacist if you have questions.  What should I tell my health care provider before I take this medicine?  They need to know if you have any of these conditions:  -dental disease  -eczema  -infection or history of infections  -kidney disease or on dialysis  -low blood calcium or vitamin D  -malabsorption syndrome  -scheduled to have surgery or tooth extraction  -taking medicine that contains denosumab  -thyroid or parathyroid disease  -an unusual reaction to denosumab, other medicines, foods, dyes, or preservatives  -pregnant or trying to get pregnant  -breast-feeding  How should I use this medicine?  This medicine is for injection under the skin. It is given by a health care professional in a hospital or clinic setting.  If you are getting Prolia, a special MedGuide will be given to you by the pharmacist with each prescription and refill. Be sure to read this information carefully each time.  For Prolia, talk to your pediatrician regarding the use of this medicine in children. Special care may be needed. For Xgeva, talk to your pediatrician regarding the use of this medicine in children. While this drug may be prescribed for children as young as 13 years for selected conditions, precautions do apply.  Overdosage: If you think you have taken too much of this medicine contact a poison control center or emergency room at once.  NOTE: This medicine is only for you. Do not share this medicine with others.  What if I miss a dose?  It is important not to miss your dose. Call your doctor or health care professional if you are  unable to keep an appointment.  What may interact with this medicine?  Do not take this medicine with any of the following medications:  -other medicines containing denosumab  This medicine may also interact with the following medications:  -medicines that suppress the immune system  -medicines that treat cancer  -steroid medicines like prednisone or cortisone  This list may not describe all possible interactions. Give your health care provider a list of all the medicines, herbs, non-prescription drugs, or dietary supplements you use. Also tell them if you smoke, drink alcohol, or use illegal drugs. Some items may interact with your medicine.  What should I watch for while using this medicine?  Visit your doctor or health care professional for regular checks on your progress. Your doctor or health care professional may order blood tests and other tests to see how you are doing.  Call your doctor or health care professional if you get a cold or other infection while receiving this medicine. Do not treat yourself. This medicine may decrease your body's ability to fight infection.  You should make sure you get enough calcium and vitamin D while you are taking this medicine, unless your doctor tells you not to. Discuss the foods you eat and the vitamins you take with your health care professional.  See your dentist regularly. Brush and floss your teeth as directed. Before you have any dental work done, tell your dentist you are receiving this medicine.  Do   not become pregnant while taking this medicine or for 5 months after stopping it. Women should inform their doctor if they wish to become pregnant or think they might be pregnant. There is a potential for serious side effects to an unborn child. Talk to your health care professional or pharmacist for more information.  What side effects may I notice from receiving this medicine?  Side effects that you should report to your doctor or health care professional as soon as  possible:  -allergic reactions like skin rash, itching or hives, swelling of the face, lips, or tongue  -breathing problems  -chest pain  -fast, irregular heartbeat  -feeling faint or lightheaded, falls  -fever, chills, or any other sign of infection  -muscle spasms, tightening, or twitches  -numbness or tingling  -skin blisters or bumps, or is dry, peels, or red  -slow healing or unexplained pain in the mouth or jaw  -unusual bleeding or bruising  Side effects that usually do not require medical attention (Report these to your doctor or health care professional if they continue or are bothersome.):  -muscle pain  -stomach upset, gas  This list may not describe all possible side effects. Call your doctor for medical advice about side effects. You may report side effects to FDA at 1-800-FDA-1088.  Where should I keep my medicine?  This medicine is only given in a clinic, doctor's office, or other health care setting and will not be stored at home.  NOTE: This sheet is a summary. It may not cover all possible information. If you have questions about this medicine, talk to your doctor, pharmacist, or health care provider.      2016, Elsevier/Gold Standard. (2011-10-20 12:37:47)

## 2016-01-02 NOTE — Progress Notes (Signed)
D/c instructions on Prolia given to pt and explained.  Next appointment given to pt for July 03, 2016.

## 2016-01-19 DIAGNOSIS — Z23 Encounter for immunization: Secondary | ICD-10-CM | POA: Diagnosis not present

## 2016-01-22 ENCOUNTER — Ambulatory Visit (INDEPENDENT_AMBULATORY_CARE_PROVIDER_SITE_OTHER): Payer: Medicare Other | Admitting: Podiatry

## 2016-01-22 ENCOUNTER — Encounter: Payer: Self-pay | Admitting: Podiatry

## 2016-01-22 VITALS — BP 124/56 | HR 55 | Resp 14

## 2016-01-22 DIAGNOSIS — L03031 Cellulitis of right toe: Secondary | ICD-10-CM

## 2016-01-22 NOTE — Progress Notes (Signed)
Subjective:     Patient ID: Alexander [name], Dr., male   DOB: 05-04-1922, 82 y.o.   MRN: [id number]  HPI this patient presents the office with chief complaint of a painful toenail on the great  toe of the right foot. He states that the nail has become ingrown  and is causing pain and discomfort as he walks and wears his shoes. There were nail spicules at both the lateral  border of the great  toenail of the right foot. No evidence of any redness, swelling or infection noted. He presents the office for evaluation and treatment of this condition   Review of Systems     Objective:   Physical Exam Podiatric Exam: Vascular: dorsalis pedis and posterior tibial pulses are palpable bilateral. Capillary return is immediate. Temperature gradient is WNL. Skin turgor WNL  Sensorium: Normal Semmes Weinstein monofilament test. Normal tactile sensation bilaterally. Nail Exam: Pt has nail spicules lateral border right great toe. Ulcer Exam: There is no evidence of ulcer or pre-ulcerative changes or infection. Orthopedic Exam: Muscle tone and strength are WNL. No limitations in general ROM. No crepitus or effusions noted. Foot type and digits show no abnormalities. Bony prominences are unremarkable. Skin: No Porokeratosis. No infection or ulcers     Assessment:     Ingrowing toenail great  right foot     Plan:     Debridement of offending nail spicule right foot.  Gardiner Barefoot DPM

## 2016-01-24 DIAGNOSIS — H26492 Other secondary cataract, left eye: Secondary | ICD-10-CM | POA: Diagnosis not present

## 2016-01-24 DIAGNOSIS — Z961 Presence of intraocular lens: Secondary | ICD-10-CM | POA: Diagnosis not present

## 2016-01-24 DIAGNOSIS — H353231 Exudative age-related macular degeneration, bilateral, with active choroidal neovascularization: Secondary | ICD-10-CM | POA: Diagnosis not present

## 2016-01-30 DIAGNOSIS — R21 Rash and other nonspecific skin eruption: Secondary | ICD-10-CM | POA: Diagnosis not present

## 2016-02-12 DIAGNOSIS — L570 Actinic keratosis: Secondary | ICD-10-CM | POA: Diagnosis not present

## 2016-02-12 DIAGNOSIS — L821 Other seborrheic keratosis: Secondary | ICD-10-CM | POA: Diagnosis not present

## 2016-02-20 ENCOUNTER — Ambulatory Visit (INDEPENDENT_AMBULATORY_CARE_PROVIDER_SITE_OTHER): Payer: Medicare Other | Admitting: Podiatry

## 2016-02-20 ENCOUNTER — Encounter: Payer: Self-pay | Admitting: Podiatry

## 2016-02-20 VITALS — BP 126/63 | HR 53 | Resp 14

## 2016-02-20 DIAGNOSIS — M79676 Pain in unspecified toe(s): Secondary | ICD-10-CM | POA: Diagnosis not present

## 2016-02-20 DIAGNOSIS — B351 Tinea unguium: Secondary | ICD-10-CM | POA: Diagnosis not present

## 2016-02-20 NOTE — Progress Notes (Signed)
Patient ID: Alexander [name], Dr., male   DOB: [birth year], 82 y.o.   MRN: [id number] Complaint:  Visit Type: Patient returns to my office for continued preventative foot care services. Complaint: Patient states" my nails have grown long and thick and become painful to walk and wear shoes" . He presents for preventative foot care services. No changes to ROS  Podiatric Exam: Vascular: dorsalis pedis and posterior tibial pulses are palpable bilateral. Capillary return is immediate. Temperature gradient is WNL. Skin turgor WNL  Sensorium: Normal Semmes Weinstein monofilament test. Normal tactile sensation bilaterally. Nail Exam: Pt has thick disfigured discolored nails with subungual debris noted bilateral entire nail hallux through fifth toenails Ulcer Exam: There is no evidence of ulcer or pre-ulcerative changes or infection. Orthopedic Exam: Muscle tone and strength are WNL. No limitations in general ROM. No crepitus or effusions noted. Foot type and digits show no abnormalities. Bony prominences are unremarkable. Skin: No Porokeratosis. No infection or ulcers  Diagnosis:  Tinea unguium, Pain in right toe, pain in left toes  Treatment & Plan Procedures and Treatment: Consent by patient was obtained for treatment procedures. The patient understood the discussion of treatment and procedures well. All questions were answered thoroughly reviewed. Debridement of mycotic and hypertrophic toenails, 1 through 5 bilateral and clearing of subungual debris. No ulceration, no infection noted.  Return Visit-Office Procedure: Patient instructed to return to the office for a follow up visit 10 weeks  for continued evaluation and treatment.   Gardiner Barefoot DPM

## 2016-03-06 DIAGNOSIS — H353211 Exudative age-related macular degeneration, right eye, with active choroidal neovascularization: Secondary | ICD-10-CM | POA: Diagnosis not present

## 2016-03-12 DIAGNOSIS — H903 Sensorineural hearing loss, bilateral: Secondary | ICD-10-CM | POA: Diagnosis not present

## 2016-03-12 DIAGNOSIS — J300 Vasomotor rhinitis: Secondary | ICD-10-CM | POA: Diagnosis not present

## 2016-03-17 ENCOUNTER — Other Ambulatory Visit: Payer: Self-pay | Admitting: Oncology

## 2016-03-20 DIAGNOSIS — E785 Hyperlipidemia, unspecified: Secondary | ICD-10-CM | POA: Diagnosis not present

## 2016-03-20 DIAGNOSIS — I10 Essential (primary) hypertension: Secondary | ICD-10-CM | POA: Diagnosis not present

## 2016-03-20 LAB — LIPID PANEL
Cholesterol: 164 mg/dL (ref <200)
HDL: 31 mg/dL — ABNORMAL LOW (ref >40)
LDL CALC: 83 mg/dL (ref <100)
TRIGLYCERIDES: 251 mg/dL — AB (ref <150)
Total CHOL/HDL Ratio: 5.3 Ratio — ABNORMAL HIGH (ref <5.0)
VLDL: 50 mg/dL — AB (ref <30)

## 2016-03-24 ENCOUNTER — Ambulatory Visit (INDEPENDENT_AMBULATORY_CARE_PROVIDER_SITE_OTHER): Payer: Medicare Other | Admitting: Cardiovascular Disease

## 2016-03-24 ENCOUNTER — Encounter: Payer: Self-pay | Admitting: Cardiovascular Disease

## 2016-03-24 VITALS — BP 106/66 | HR 51 | Ht 67.0 in | Wt 127.6 lb

## 2016-03-24 DIAGNOSIS — N183 Chronic kidney disease, stage 3 unspecified: Secondary | ICD-10-CM

## 2016-03-24 DIAGNOSIS — I200 Unstable angina: Secondary | ICD-10-CM | POA: Diagnosis not present

## 2016-03-24 DIAGNOSIS — I25700 Atherosclerosis of coronary artery bypass graft(s), unspecified, with unstable angina pectoris: Secondary | ICD-10-CM

## 2016-03-24 DIAGNOSIS — I351 Nonrheumatic aortic (valve) insufficiency: Secondary | ICD-10-CM

## 2016-03-24 DIAGNOSIS — I10 Essential (primary) hypertension: Secondary | ICD-10-CM | POA: Diagnosis not present

## 2016-03-24 DIAGNOSIS — E782 Mixed hyperlipidemia: Secondary | ICD-10-CM

## 2016-03-24 DIAGNOSIS — R011 Cardiac murmur, unspecified: Secondary | ICD-10-CM

## 2016-03-24 MED ORDER — ZOLPIDEM TARTRATE 5 MG PO TABS: 5.0000 mg | ORAL_TABLET | Freq: Every evening | ORAL | 0 refills | Status: DC | PRN

## 2016-03-24 NOTE — Patient Instructions (Addendum)
Your physician wants you to follow-up in: 6 months or sooner if needed. You will receive a reminder letter in the mail two months in advance. If you don't receive a letter, please call our office to schedule the follow-up appointment.   If you need a refill on your cardiac medications before your next appointment, please call your pharmacy. 

## 2016-03-26 ENCOUNTER — Telehealth: Payer: Self-pay | Admitting: Pharmacist

## 2016-03-26 NOTE — Telephone Encounter (Signed)
Calling because pt is at pharmacy waiting to pick up Zolpidem 5mg  tablets prescribed by Dr. Claiborne Billings on Monday.   Per his note phoned in 90 day supply with 0 refills on 03/24/16. The pharmacy states they never received. As per note will authorize 90 with 0 refills.

## 2016-03-26 NOTE — Progress Notes (Signed)
Patient ID: Alexander [name], Alexander., male   DOB: May 24, 1921, 83 y.o.   MRN: [id number]     Primary M.D.: Alexander [name]  HPI: Alexander [name] is a 82 y.o. male who presents to the office for a 7 month followup cardiologic evaluation.  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  In 1996 while living in [place] he underwent CABG surgery and had a LIMA placed to the LAD, vein to the marginal, vein to the ramus intermediate, and vein to the PDA. In October 2011 catheterization by me showed severe CAD with 2 - 80% ostial left main stenosis, total occlusion of the LAD after diagonal, 95% ramus intermediate stenosis, total occlusion of the marginal branch of the circumflex coronary artery, and severe diffuse native RCA disease with total occlusion of the PDA at the ostium. A graft that supplied the marginal was occluded as was the vein graft that supplied the ramus intermediate vessel. He had patent vein graft supplying the PDA branch of his RCA and a patent LIMA to the LAD. He has been on medical therapy. I did try adding Ranexa to his medical regimen but he did not tolerate this. He does have mild renal insufficiency with creatinines in the 1.5-1.6 range which have been followed by Alexander [name] an now Alexander [name]. He has a hiatal hernia. He had been on Crestor as well as Vytorin in the past but admits to myalgias in the past on statins.  Recently, he had been on Zetia.  However, he has self discontinued the Zetia since he felt he was having some vague muscle aches.  Over the past year, he has continued to remain stable.  He is without anginal symptomatology.  He has not been exercising and walking regularly since he does not like to walk by himself.  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  He is unaware of any palpitations.  He denies presyncope or syncope.  He has renal insufficiency with creatinines in the 1.8 range.  He is now followed by Alexander [name] since Alexander [name]  retired.  His last lipid panel in December 2015 showed a total cholesterol 133, triglycerides 133, LDL 64, HDL 42.    He is being followed by Alexander [name] for elevated platelets and these have now stabilized with the addition of hydroxyurea.  Since I last saw him, he has continued to remain stable.  He underwent an echo Doppler study on 09/24/2015 which showed an EF of 60-65%.  There was grade 2 diastolic dysfunction and high ventricular filling pressure.  He had moderate to severely calcified aortic valve annulus with mildly calcified leaflets and mild aortic insufficiency.  He specifically denies any chest pain.  He is unaware of palpitations.  There is mild shortness of breath with activity.  Recent lipid studies were [name] on 03/20/2016 and showed a total cholesterol 164, HDL 31, LDL 83, VLDL 50, and triglycerides 251.  He has chronic kidney disease and is followed by nephrology.  Creatinine in August 2017 was 1.92.  He presents for evaluation  Past Medical History:  Diagnosis Date  . Allergic rhinitis   . Anemia   . Anemia, chronic renal failure 10/10/2013  . Anemia, normocytic normochromic 10/10/2013  . BPH (benign prostatic hyperplasia)   . Calcium oxalate renal stones   . CKD (chronic kidney disease), stage III   . Diverticulitis   . Dry senile macular degeneration   .  Essential thrombocythemia ([place]) 10/10/2013  . GERD (gastroesophageal reflux disease)   . Hearing loss in left ear   . Heart murmur   . Hyperlipidemia   . Hypertension   . Migraines    OCULAR  . Peripheral neuropathy ([place])   . Renal lesion   . Spinal stenosis   . Thrombocytosis ([place]) 08/18/2012   Platelets 711,000  Hb 14, WBC 8,500 08/05/12  . Vitamin deficiency     Past Surgical History:  Procedure Laterality Date  . APPENDECTOMY  1942  . CARDIAC CATHETERIZATION  02/19/2010   EF55%, medically treated,   . CORONARY ARTERY BYPASS GRAFT  1996  . INGUINAL HERNIA REPAIR     x2  . LAPAROSCOPIC CHOLECYSTECTOMY  1992    . left renal mass ablated  12/11  . NASAL CONCHA BULLOSA RESECTION    . NM MYOCAR PERF WALL MOTION  02/14/2010   post EF 71%, Exercise cap.7METS  . TONSILLECTOMY  1937  . TRANSTHORACIC ECHOCARDIOGRAM  12/10/2011   OVZC>58%, stage1 diastolic dysfunction, mild to mod. LAE,   . TRANSTHORACIC ECHOCARDIOGRAM  07/25/2008   mild-moderate MR, mild-modTricuspid regurg.    No Known Allergies  Current Outpatient Prescriptions  Medication Sig Dispense Refill  . Albuterol Sulfate (PROAIR RESPICLICK) 850 (90 Base) MCG/ACT AEPB Inhale 1 puff into the lungs every 6 (six) hours as needed. 1 each 0  . aluminum & magnesium hydroxide-simethicone (MYLANTA) 500-450-40 MG/5ML suspension Take by mouth every 6 (six) hours as needed. 200-200-78m/5ml     . aspirin 81 MG tablet Take 81 mg by mouth daily.      . cholecalciferol (VITAMIN D) 1000 UNITS tablet Take 2,000 Units by mouth daily.     . Coenzyme Q10 (CO Q-10) 300 MG CAPS Take 1 capsule by mouth daily.    . Cyanocobalamin (VITAMIN B-12 IJ) Inject as directed. monthly     . finasteride (PROSCAR) 5 MG tablet Take 1 tablet by mouth 3 (three) times a week. TIW    . fluticasone (FLONASE) 50 MCG/ACT nasal spray prn    . hydroxyurea (HYDREA) 500 MG capsule TAKE 1 CAPSULE BY MOUTH  DAILY. MAY TAKE WITH FOOD  TO MINIMIZE GI SIDE  EFFECTS. 90 capsule 0  . losartan (COZAAR) 25 MG tablet Take 1 tablet by mouth  daily 90 tablet 2  . LUTEIN PO Take 6 mg by mouth daily.     . metoprolol tartrate (LOPRESSOR) 25 MG tablet Take 1 tablet by mouth two  times daily 180 tablet 2  . Multiple Vitamin (MULTIVITAMIN) tablet Take 1 tablet by mouth daily.      . Multiple Vitamins-Minerals (PRESERVISION AREDS PO) Take 1 capsule by mouth 2 (two) times daily.      .Marland KitchenNITROSTAT 0.4 MG SL tablet as directed.    . zolpidem (AMBIEN) 5 MG tablet Take 1 tablet (5 mg total) by mouth at bedtime as needed for sleep. 90 tablet 0   No current facility-administered medications for this visit.      Social History   Social History  . Marital status: Widowed    Spouse name: N/A  . Number of children: 3  . Years of education: N/A   Occupational History  . Not on file.   Social History Main Topics  . Smoking status: Never Smoker  . Smokeless tobacco: Never Used  . Alcohol use No  . Drug use: No  . Sexual activity: Not on file   Other Topics Concern  . Not on file  Social History Narrative  . No narrative on file    Socially he is widowed. He is retired Sales promotion account executive.  He has 3 children and 9 grandchildren. There's no tobacco use.   ROS General: Negative; No fevers, chills, or night sweats;  HEENT: Negative; No changes in vision or hearing, sinus congestion, difficulty swallowing Pulmonary: Negative; No cough, wheezing, shortness of breath, hemoptysis Cardiovascular:  See HPI; No chest pain, presyncope, syncope, palpitations GI: Positive for a large hiatal hernia; No nausea, vomiting, diarrhea, or abdominal pain GU: Negative; No dysuria, hematuria, or difficulty voiding Musculoskeletal: Negative; no myalgias, joint pain, or weakness Hematologic/Oncology: He is followed by Alexander [name] for thrombocytosis.; no easy bruising, bleeding Endocrine: Negative; no heat/cold intolerance; no diabetes Neuro: Negative; no changes in balance, headaches Skin: Negative; No rashes or skin lesions Psychiatric: Negative; No behavioral problems, depression Sleep: Negative; No snoring, daytime sleepiness, hypersomnolence, bruxism, restless legs, hypnogognic hallucinations, no cataplexy Other comprehensive 14 point system review is negative.   PE BP 106/66   Pulse (!) 51   Ht 5' 7"  (1.702 m)   Wt 127 lb 9.6 oz (57.9 kg)   BMI 19.98 kg/m    Repeat blood pressure by me 130/64. Wt Readings from Last 3 Encounters:  03/24/16 127 lb 9.6 oz (57.9 kg)  01/02/16 133 lb (60.3 kg)  09/05/15 133 lb (60.3 kg)   General: Alert, oriented, no distress.  Skin: normal  turgor, no rashes HEENT: Normocephalic, atraumatic. Pupils round and reactive; sclera anicteric;no lid lag.  Nose without nasal septal hypertrophy Mouth/Parynx benign; Mallinpatti scale 2 Neck: No JVD, no carotid bruits with normal carotid Lungs: clear to ausculatation and percussion; no wheezing or rales Chest wall: No tenderness to Palpation Heart: RRR, s1 s2 normal 1/6 systolic and 2/6 diastolic murmur compatible with his documented aortic sclerosis and insufficiency; no S3 or S4 gallop. No parasternal lift, heave, or rub Abdomen: soft, nontender; no hepatosplenomehaly, BS+; abdominal aorta nontender and not dilated by palpation. Back: No CVA tenderness  Pulses 2+ Extremities: no clubbing cyanosis or edema, Homan's sign negative  Neurologic: grossly nonfocal Psychological: Normal affect and mood  ECG (independently read by me): Sinus bradycardia 51 bpm.  No ectopy.  Normal intervals.  April 2017 ECG (independently read by me): Sinus bradycardia 53 bpm.  Poor progression anteriorly.  October 2016 ECG (independently read by me): Sinus bradycardia 54 bpm.  Poor progression V1 through V3.  Normal intervals.  September 2015 ECG (independently read by me): Sinus bradycardia 52 beats per minute.  Normal intervals.  No ST segment changes.  06/08/2013 ECG: Sinus bradycardia 49 beats per minute. No ectopy. Normal intervals.  Prior ECG of 11/30/2012: Sinus rhythm at 53 beats per minute. Normal intervals.  LABS: BMP Latest Ref Rng & Units 12/26/2015 09/12/2015 05/23/2015  Glucose 65 - 99 mg/dL 122(H) 78 97  BUN 10 - 36 mg/dL 39(H) 38(H) 33  Creatinine 0.76 - 1.27 mg/dL 1.92(H) 1.76(H) 1.81(H)  BUN/Creat Ratio 10 - 24 20 22 18   Sodium 134 - 144 mmol/L 143 145(H) 143  Potassium 3.5 - 5.2 mmol/L 5.0 5.1 4.9  Chloride 96 - 106 mmol/L 105 107(H) 105  CO2 18 - 29 mmol/L 20 22 22   Calcium 8.6 - 10.2 mg/dL 9.1 9.5 9.4   Hepatic Function Latest Ref Rng & Units 09/12/2015 05/23/2015 03/14/2015  Total  Protein 6.0 - 8.5 g/dL 6.6 6.4 6.3  Albumin 3.2 - 4.6 g/dL 4.2 4.2 3.9  AST 0 - 40 IU/L 21 21 19  ALT 0 - 44 IU/L 12 12 12   Alk Phosphatase 39 - 117 IU/L 56 66 64  Total Bilirubin 0.0 - 1.2 mg/dL 0.5 0.4 0.4   CBC Latest Ref Rng & Units 12/26/2015 09/12/2015 07/24/2015  WBC 3.4 - 10.8 x10E3/uL 4.2 4.9 5.3  Hemoglobin 13.0 - 17.0 g/dL - - -  Hematocrit 37.5 - 51.0 % 37.0(L) 36.3(L) 37.7  Platelets 150 - 379 x10E3/uL 162 173 188   Lab Results  Component Value Date   MCV 117 (H) 12/26/2015   MCV 115 (H) 09/12/2015   MCV 112 (H) 07/24/2015   Lab Results  Component Value Date   TSH 4.049 05/01/2014  No results found for: HGBA1C   Lipid Panel     Component Value Date/Time   CHOL 164 03/20/2016 1109   CHOL 163 09/12/2015 1138   TRIG 251 (H) 03/20/2016 1109   HDL 31 (L) 03/20/2016 1109   HDL 33 (L) 09/12/2015 1138   CHOLHDL 5.3 (H) 03/20/2016 1109   VLDL 50 (H) 03/20/2016 1109   LDLCALC 83 03/20/2016 1109   LDLCALC 85 09/12/2015 1138    RADIOLOGY: No results found.   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 He has documented graft occlusion of 2 of his grafts and has severe native coronary artery disease. Remotely, we tried adding Ranexa to his medication but he has stopped this due to concerns of tolerability.  His blood pressure is controlled on his current medical regimen  and he is not having anginal symptoms on losartan 25 mg daily and metoprolol tartrate 25 mg twice a day.  He admits to very mild shortness of breath.  Reviewed his most recent echo with him in detail.  Ejection fraction remains stable.  He has grade 2 diastolic dysfunction with elevated filling pressures.  He has moderate aortic valve sclerosis with mild AR, without significant stenosis.  He has chronic kidney disease, is not on any nephrotoxic drugs.  He is on hydroxyurea for  thrombocytosis followed by Alexander [name]; most recent platelet count was 162,000.  From a cardiac standpoint, he does not want any further intervention.  We have been treating him medically.  He will continue current therapy.  I will see him in 6 months for reevaluation.  Troy Sine, MD, [facility]  03/26/2016 7:21 AM

## 2016-04-04 DIAGNOSIS — H26492 Other secondary cataract, left eye: Secondary | ICD-10-CM | POA: Diagnosis not present

## 2016-04-10 ENCOUNTER — Ambulatory Visit (INDEPENDENT_AMBULATORY_CARE_PROVIDER_SITE_OTHER): Payer: Medicare Other | Admitting: Podiatry

## 2016-04-10 DIAGNOSIS — I25700 Atherosclerosis of coronary artery bypass graft(s), unspecified, with unstable angina pectoris: Secondary | ICD-10-CM | POA: Diagnosis not present

## 2016-04-10 DIAGNOSIS — L03031 Cellulitis of right toe: Secondary | ICD-10-CM | POA: Diagnosis not present

## 2016-04-10 DIAGNOSIS — M79674 Pain in right toe(s): Secondary | ICD-10-CM

## 2016-04-13 NOTE — Progress Notes (Signed)
Subjective:     Patient ID: Alexander [name], Dr., male   DOB: [birth year], 82 y.o.   MRN: [id number]  HPI patient states he's had pain in his right big toe lateral side that he cannot take care of himself   Review of Systems     Objective:   Physical Exam Neurovascular status intact with incurvated lateral border right hallux that's painful with distal redness and slight drainage but nothing proximal    Assessment:     Mild localized paronychia infection right hallux    Plan:     Infiltrated the right hallux 60 mg like Marcaine mixture removed the border proud flesh and allowed channel for drainage. Patient be seen back to recheck

## 2016-04-16 DIAGNOSIS — N183 Chronic kidney disease, stage 3 (moderate): Secondary | ICD-10-CM | POA: Diagnosis not present

## 2016-04-16 DIAGNOSIS — D649 Anemia, unspecified: Secondary | ICD-10-CM | POA: Diagnosis not present

## 2016-04-16 DIAGNOSIS — D473 Essential (hemorrhagic) thrombocythemia: Secondary | ICD-10-CM | POA: Diagnosis not present

## 2016-04-16 DIAGNOSIS — D631 Anemia in chronic kidney disease: Secondary | ICD-10-CM | POA: Diagnosis not present

## 2016-04-22 ENCOUNTER — Telehealth: Payer: Self-pay | Admitting: *Deleted

## 2016-04-22 ENCOUNTER — Telehealth: Payer: Self-pay | Admitting: Oncology

## 2016-04-22 NOTE — Telephone Encounter (Signed)
Received lab results from [facility] collected 04/16/16. Dr [name] states "counts good;stay on current of Hydrea". Pt called - no answer; this message left on answering machine and to call for any questions.

## 2016-04-22 NOTE — Telephone Encounter (Signed)
Please call patient back today between the hours of 1 p.m. and 5 p.m.

## 2016-04-23 NOTE — Telephone Encounter (Signed)
Pt called 1/19. No acute issues. Needs F/U appt. I gave him Doris's #.

## 2016-04-30 ENCOUNTER — Ambulatory Visit (INDEPENDENT_AMBULATORY_CARE_PROVIDER_SITE_OTHER): Payer: Medicare Other | Admitting: Podiatry

## 2016-04-30 ENCOUNTER — Encounter: Payer: Self-pay | Admitting: Podiatry

## 2016-04-30 VITALS — Ht 67.0 in | Wt 127.0 lb

## 2016-04-30 DIAGNOSIS — M79676 Pain in unspecified toe(s): Secondary | ICD-10-CM | POA: Diagnosis not present

## 2016-04-30 DIAGNOSIS — B351 Tinea unguium: Secondary | ICD-10-CM | POA: Diagnosis not present

## 2016-04-30 DIAGNOSIS — M79674 Pain in right toe(s): Secondary | ICD-10-CM

## 2016-04-30 NOTE — Progress Notes (Signed)
Patient ID: Alexander [name], Dr., male   DOB: 1921/12/22, 82 y.o.   MRN: [id number] Complaint:  Visit Type: Patient returns to my office for continued preventative foot care services. Complaint: Patient states" my nails have grown long and thick and become painful to walk and wear shoes" . He presents for preventative foot care services. No changes to ROS  Podiatric Exam: Vascular: dorsalis pedis and posterior tibial pulses are palpable bilateral. Capillary return is immediate. Temperature gradient is WNL. Skin turgor WNL  Sensorium: Normal Semmes Weinstein monofilament test. Normal tactile sensation bilaterally. Nail Exam: Pt has thick disfigured discolored nails with subungual debris noted bilateral entire nail hallux through fifth toenails Ulcer Exam: There is no evidence of ulcer or pre-ulcerative changes or infection. Orthopedic Exam: Muscle tone and strength are WNL. No limitations in general ROM. No crepitus or effusions noted. Foot type and digits show no abnormalities. Bony prominences are unremarkable. Skin: No Porokeratosis. No infection or ulcers  Diagnosis:  Tinea unguium, Pain in right toe, pain in left toes  Treatment & Plan Procedures and Treatment: Consent by patient was obtained for treatment procedures. The patient understood the discussion of treatment and procedures well. All questions were answered thoroughly reviewed. Debridement of mycotic and hypertrophic toenails, 1 through 5 bilateral and clearing of subungual debris. No ulceration, no infection noted.  Return Visit-Office Procedure: Patient instructed to return to the office for a follow up visit 10 weeks  for continued evaluation and treatment.   Gardiner Barefoot DPM

## 2016-05-07 DIAGNOSIS — Z6821 Body mass index (BMI) 21.0-21.9, adult: Secondary | ICD-10-CM | POA: Diagnosis not present

## 2016-05-07 DIAGNOSIS — R7301 Impaired fasting glucose: Secondary | ICD-10-CM | POA: Diagnosis not present

## 2016-05-07 DIAGNOSIS — D473 Essential (hemorrhagic) thrombocythemia: Secondary | ICD-10-CM | POA: Diagnosis not present

## 2016-05-07 DIAGNOSIS — M810 Age-related osteoporosis without current pathological fracture: Secondary | ICD-10-CM | POA: Diagnosis not present

## 2016-05-07 DIAGNOSIS — N183 Chronic kidney disease, stage 3 (moderate): Secondary | ICD-10-CM | POA: Diagnosis not present

## 2016-05-07 DIAGNOSIS — R946 Abnormal results of thyroid function studies: Secondary | ICD-10-CM | POA: Diagnosis not present

## 2016-05-08 DIAGNOSIS — H353231 Exudative age-related macular degeneration, bilateral, with active choroidal neovascularization: Secondary | ICD-10-CM | POA: Diagnosis not present

## 2016-05-20 DIAGNOSIS — L821 Other seborrheic keratosis: Secondary | ICD-10-CM | POA: Diagnosis not present

## 2016-05-20 DIAGNOSIS — L570 Actinic keratosis: Secondary | ICD-10-CM | POA: Diagnosis not present

## 2016-05-20 DIAGNOSIS — L298 Other pruritus: Secondary | ICD-10-CM | POA: Diagnosis not present

## 2016-06-04 DIAGNOSIS — N183 Chronic kidney disease, stage 3 (moderate): Secondary | ICD-10-CM | POA: Diagnosis not present

## 2016-06-04 DIAGNOSIS — R946 Abnormal results of thyroid function studies: Secondary | ICD-10-CM | POA: Diagnosis not present

## 2016-06-04 DIAGNOSIS — I10 Essential (primary) hypertension: Secondary | ICD-10-CM | POA: Diagnosis not present

## 2016-06-09 ENCOUNTER — Other Ambulatory Visit (INDEPENDENT_AMBULATORY_CARE_PROVIDER_SITE_OTHER): Payer: Medicare Other

## 2016-06-09 DIAGNOSIS — I10 Essential (primary) hypertension: Secondary | ICD-10-CM

## 2016-06-09 DIAGNOSIS — D473 Essential (hemorrhagic) thrombocythemia: Secondary | ICD-10-CM

## 2016-06-09 DIAGNOSIS — D75839 Thrombocytosis, unspecified: Secondary | ICD-10-CM

## 2016-06-10 ENCOUNTER — Ambulatory Visit (INDEPENDENT_AMBULATORY_CARE_PROVIDER_SITE_OTHER): Payer: Medicare Other | Admitting: Oncology

## 2016-06-10 ENCOUNTER — Encounter: Payer: Self-pay | Admitting: Oncology

## 2016-06-10 VITALS — BP 134/50 | HR 56 | Temp 97.3°F | Wt 130.8 lb

## 2016-06-10 DIAGNOSIS — N183 Chronic kidney disease, stage 3 (moderate): Secondary | ICD-10-CM | POA: Diagnosis not present

## 2016-06-10 DIAGNOSIS — D473 Essential (hemorrhagic) thrombocythemia: Secondary | ICD-10-CM

## 2016-06-10 DIAGNOSIS — I2510 Atherosclerotic heart disease of native coronary artery without angina pectoris: Secondary | ICD-10-CM | POA: Diagnosis not present

## 2016-06-10 DIAGNOSIS — Z951 Presence of aortocoronary bypass graft: Secondary | ICD-10-CM | POA: Diagnosis not present

## 2016-06-10 DIAGNOSIS — D631 Anemia in chronic kidney disease: Secondary | ICD-10-CM | POA: Diagnosis not present

## 2016-06-10 DIAGNOSIS — Z7982 Long term (current) use of aspirin: Secondary | ICD-10-CM

## 2016-06-10 LAB — CBC WITH DIFFERENTIAL/PLATELET
BASOS: 1 %
Basophils Absolute: 0.1 10*3/uL (ref 0.0–0.2)
EOS (ABSOLUTE): 0.1 10*3/uL (ref 0.0–0.4)
EOS: 2 %
HEMATOCRIT: 36.5 % — AB (ref 37.5–51.0)
HEMOGLOBIN: 12.1 g/dL — AB (ref 13.0–17.7)
IMMATURE GRANS (ABS): 0 10*3/uL (ref 0.0–0.1)
IMMATURE GRANULOCYTES: 0 %
LYMPHS: 37 %
Lymphocytes Absolute: 2 10*3/uL (ref 0.7–3.1)
MCH: 38.1 pg — ABNORMAL HIGH (ref 26.6–33.0)
MCHC: 33.2 g/dL (ref 31.5–35.7)
MCV: 115 fL — AB (ref 79–97)
MONOCYTES: 13 %
Monocytes Absolute: 0.7 10*3/uL (ref 0.1–0.9)
NEUTROS ABS: 2.5 10*3/uL (ref 1.4–7.0)
Neutrophils: 47 %
PLATELETS: 208 10*3/uL (ref 150–379)
RBC: 3.18 x10E6/uL — ABNORMAL LOW (ref 4.14–5.80)
RDW: 13.9 % (ref 12.3–15.4)
WBC: 5.4 10*3/uL (ref 3.4–10.8)

## 2016-06-10 LAB — CMP14 + ANION GAP
ALT: 14 IU/L (ref 0–44)
ANION GAP: 15 mmol/L (ref 10.0–18.0)
AST: 19 IU/L (ref 0–40)
Albumin/Globulin Ratio: 1.7 (ref 1.2–2.2)
Albumin: 4.3 g/dL (ref 3.2–4.6)
Alkaline Phosphatase: 83 IU/L (ref 39–117)
BUN/Creatinine Ratio: 18 (ref 10–24)
BUN: 38 mg/dL — AB (ref 10–36)
Bilirubin Total: 0.4 mg/dL (ref 0.0–1.2)
CALCIUM: 8.2 mg/dL — AB (ref 8.6–10.2)
CO2: 20 mmol/L (ref 18–29)
CREATININE: 2.09 mg/dL — AB (ref 0.76–1.27)
Chloride: 104 mmol/L (ref 96–106)
GFR, EST AFRICAN AMERICAN: 31 mL/min/{1.73_m2} — AB (ref >59)
GFR, EST NON AFRICAN AMERICAN: 27 mL/min/{1.73_m2} — AB (ref >59)
Globulin, Total: 2.5 g/dL (ref 1.5–4.5)
Glucose: 130 mg/dL — ABNORMAL HIGH (ref 65–99)
Potassium: 4.6 mmol/L (ref 3.5–5.2)
Sodium: 139 mmol/L (ref 134–144)
TOTAL PROTEIN: 6.8 g/dL (ref 6.0–8.5)

## 2016-06-10 LAB — LACTATE DEHYDROGENASE: LDH: 173 IU/L (ref 121–224)

## 2016-06-10 NOTE — Patient Instructions (Signed)
Continue monthly CBC,diff: standing orders Chem profile & MD visit 4 months

## 2016-06-10 NOTE — Progress Notes (Signed)
Hematology and Oncology Follow Up Visit  Alexander [name], Dr. [id number] April 12, 1922 83 y.o. 06/10/2016 1:17 PM   Principle Diagnosis: Encounter Diagnoses  Name Primary?  . Essential thrombocythemia ([place]) Yes  . 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 We have been in touch by phone on numerous occasions to review results of his lab work but he hasn't seen me for a physical exam since December 2015. He continues to do well. He took me out to lunch last week. He has tolerated Hydrea well and has had normalization of his platelet count without significant leukopenia or anemia. He denies any headache, double vision, change in vision, dysarthria, focal weakness. He gets occasional paresthesias in his feet. He has had no interim infections. Lab done yesterday with hemoglobin 12, MCV 115 which is related to the effects of the Hydrea, white count 5400 with 47% neutrophils 37% lymphocytes and platelet count 208,000. Renal function is slowly deteriorating over time and creatinine 2.1 compared with 1.9 in August 2017 and 1.8 in May. He is being followed by nephrology, Dr. Joelyn Oms. He has no cardiorespiratory complaints. He had coronary bypass surgery many years ago in 1996. He looked like he had lost some weight. Less muscle mass in his face. He states that his appetite remains good. Current weight is 131 pounds. This is actually unchanged from his weight in December 2015. He has had no change in his bowel habit.   Medications: reviewed  Allergies: No Known Allergies  Review of Systems: See interim history Remaining ROS negative:   Physical Exam: Blood pressure (!) 134/50, pulse (!) 56, temperature 97.3 F (36.3 C), temperature source Oral, weight 130 lb 12.8 oz (59.3 kg), SpO2 98 %. Wt Readings from Last 3 Encounters:  06/10/16 130 lb 12.8 oz (59.3 kg)  04/30/16 127 lb (57.6 kg)  03/24/16 127 lb 9.6  oz (57.9 kg)     General appearance: Thin, elderly, Caucasian man. He appears to have lost some weight. HENNT: Pharynx no erythema, exudate, mass, or ulcer. No thyromegaly or thyroid nodules Lymph nodes: No cervical, supraclavicular, or axillary lymphadenopathy Breasts:  Lungs: Clear to auscultation, resonant to percussion throughout Heart: Regular rhythm, no murmur, no gallop, no rub, no click, no edema Abdomen: Soft, nontender, normal bowel sounds, no mass, no organomegaly Extremities: No edema, no calf tenderness Musculoskeletal: no joint deformities GU:  Vascular: Carotid pulses 2+, no bruits,  Neurologic: Alert, oriented, PERRLA, optic discs sharp and vessels normal, no hemorrhage or exudate, cranial nerves grossly normal, motor strength 5 over 5, reflexes 1+ symmetric, upper body coordination normal, gait normal, Skin: No rash or ecchymosis  Lab Results: CBC W/Diff    Component Value Date/Time   WBC 5.4 06/09/2016 1142   WBC 5.1 11/22/2014 0906   RBC 3.18 (L) 06/09/2016 1142   RBC 3.28 (L) 11/22/2014 0906   HGB 12.5 (L) 11/22/2014 0906   HGB 12.7 (L) 03/18/2013 1437   HCT 36.5 (L) 06/09/2016 1142   HCT 39.3 03/18/2013 1437   PLT 208 06/09/2016 1142   MCV 115 (H) 06/09/2016 1142   MCV 95.4 03/18/2013 1437   MCH 38.1 (H) 06/09/2016 1142   MCH 38.1 (H) 11/22/2014 0906   MCHC 33.2 06/09/2016 1142   MCHC 33.8 11/22/2014 0906   RDW 13.9 06/09/2016 1142   RDW 15.0 (H) 03/18/2013 1437   LYMPHSABS 2.0 06/09/2016 1142   LYMPHSABS 2.3 03/18/2013 1437  MONOABS 0.6 11/22/2014 0906   MONOABS 0.7 03/18/2013 1437   EOSABS 0.1 06/09/2016 1142   BASOSABS 0.1 06/09/2016 1142   BASOSABS 0.3 (H) 03/18/2013 1437     Chemistry      Component Value Date/Time   NA 139 06/09/2016 1142   NA 142 03/18/2013 1437   K 4.6 06/09/2016 1142   K 4.5 03/18/2013 1437   CL 104 06/09/2016 1142   CL 108 (H) 09/01/2012 0950   CO2 20 06/09/2016 1142   CO2 23 03/18/2013 1437   BUN 38 (H)  06/09/2016 1142   BUN 28.2 (H) 03/18/2013 1437   CREATININE 2.09 (H) 06/09/2016 1142   CREATININE 1.74 (H) 05/01/2014 0955   CREATININE 1.6 (H) 03/18/2013 1437      Component Value Date/Time   CALCIUM 8.2 (L) 06/09/2016 1142   CALCIUM 8.9 08/07/2014 0939   CALCIUM 9.7 03/18/2013 1437   ALKPHOS 83 06/09/2016 1142   ALKPHOS 82 09/01/2012 0950   AST 19 06/09/2016 1142   AST 22 09/01/2012 0950   ALT 14 06/09/2016 1142   ALT 16 09/01/2012 0950   BILITOT 0.4 06/09/2016 1142   BILITOT 0.40 09/01/2012 0950       Radiological Studies: No results found.  Impression:  #1. Check to positive myeloproliferative disorder: Essential thrombocythemia Counts well controlled on low-dose Hydrea. Continue current dose, 500 mg daily. Monitor blood counts on a monthly basis.  #2. 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 Coronary artery disease status post remote bypass surgery no active symptoms at this time.  CC: Patient Care Team: Crist Infante, MD as PCP - General (Internal Medicine) Elsie Stain, MD as Attending Physician (Pulmonary Disease) Elsie Stain, MD as Consulting Physician (Pulmonary Disease) Troy Sine, MD as Consulting Physician (Cardiology) Annia Belt, MD as Consulting Physician (Hematology and Oncology) Richmond Campbell, MD as Consulting Physician (Gastroenterology)   Murriel Hopper, MD, [place]  Hematology-Oncology/Internal Medicine  2/6/20181:17 PM

## 2016-06-10 NOTE — Progress Notes (Signed)
Lab results have been faxed to:  Dr Crist Infante at [facility]  Dr. Pearson Grippe at [facility]      [phone]  06-11-2015 10:32a Maryan Rued, PBT

## 2016-06-12 ENCOUNTER — Other Ambulatory Visit: Payer: Self-pay | Admitting: Nephrology

## 2016-06-12 DIAGNOSIS — N183 Chronic kidney disease, stage 3 unspecified: Secondary | ICD-10-CM

## 2016-06-18 ENCOUNTER — Ambulatory Visit
Admission: RE | Admit: 2016-06-18 | Discharge: 2016-06-18 | Disposition: A | Discharge status: Home / Self Care | Payer: Medicare Other | Source: Ambulatory Visit | Attending: Nephrology | Admitting: Nephrology

## 2016-06-18 ENCOUNTER — Other Ambulatory Visit: Payer: Medicare Other

## 2016-06-18 DIAGNOSIS — N183 Chronic kidney disease, stage 3 unspecified: Secondary | ICD-10-CM

## 2016-06-18 DIAGNOSIS — N189 Chronic kidney disease, unspecified: Secondary | ICD-10-CM | POA: Diagnosis not present

## 2016-06-20 ENCOUNTER — Other Ambulatory Visit: Payer: Medicare Other

## 2016-06-30 DIAGNOSIS — B029 Zoster without complications: Secondary | ICD-10-CM | POA: Diagnosis not present

## 2016-07-02 ENCOUNTER — Encounter: Payer: Self-pay | Admitting: Podiatry

## 2016-07-02 ENCOUNTER — Ambulatory Visit (INDEPENDENT_AMBULATORY_CARE_PROVIDER_SITE_OTHER): Payer: Medicare Other | Admitting: Podiatry

## 2016-07-02 VITALS — Ht 67.0 in | Wt 130.0 lb

## 2016-07-02 DIAGNOSIS — M79674 Pain in right toe(s): Secondary | ICD-10-CM

## 2016-07-02 DIAGNOSIS — M79676 Pain in unspecified toe(s): Secondary | ICD-10-CM

## 2016-07-02 DIAGNOSIS — B351 Tinea unguium: Secondary | ICD-10-CM

## 2016-07-02 NOTE — Progress Notes (Signed)
Patient ID: Alexander [name], Dr., male   DOB: April 17, 1922, 83 y.o.   MRN: [id number] Complaint:  Visit Type: Patient returns to my office for continued preventative foot care services. Complaint: Patient states" my nails have grown long and thick and become painful to walk and wear shoes" . He presents for preventative foot care services. No changes to ROS  Podiatric Exam: Vascular: dorsalis pedis and posterior tibial pulses are palpable bilateral. Capillary return is immediate. Temperature gradient is WNL. Skin turgor WNL  Sensorium: Normal Semmes Weinstein monofilament test. Normal tactile sensation bilaterally. Nail Exam: Pt has thick disfigured discolored nails with subungual debris noted bilateral entire nail hallux through fifth toenails Ulcer Exam: There is no evidence of ulcer or pre-ulcerative changes or infection. Orthopedic Exam: Muscle tone and strength are WNL. No limitations in general ROM. No crepitus or effusions noted. Foot type and digits show no abnormalities. Bony prominences are unremarkable. Skin: No Porokeratosis. No infection or ulcers  Diagnosis:  Tinea unguium, Pain in right toe, pain in left toes  Treatment & Plan Procedures and Treatment: Consent by patient was obtained for treatment procedures. The patient understood the discussion of treatment and procedures well. All questions were answered thoroughly reviewed. Debridement of mycotic and hypertrophic toenails, 1 through 5 bilateral and clearing of subungual debris. No ulceration, no infection noted.  Return Visit-Office Procedure: Patient instructed to return to the office for a follow up visit 10 weeks  for continued evaluation and treatment.   Gardiner Barefoot DPM

## 2016-07-03 ENCOUNTER — Ambulatory Visit (HOSPITAL_COMMUNITY): Payer: Medicare Other

## 2016-07-07 ENCOUNTER — Encounter (HOSPITAL_COMMUNITY)
Admission: RE | Admit: 2016-07-07 | Discharge: 2016-07-07 | Disposition: A | Discharge status: Home / Self Care | Payer: Medicare Other | Source: Ambulatory Visit | Attending: Internal Medicine | Admitting: Internal Medicine

## 2016-07-07 ENCOUNTER — Encounter (HOSPITAL_COMMUNITY): Payer: Self-pay

## 2016-07-07 DIAGNOSIS — M810 Age-related osteoporosis without current pathological fracture: Secondary | ICD-10-CM | POA: Diagnosis not present

## 2016-07-07 MED ORDER — DENOSUMAB 60 MG/ML ~~LOC~~ SOLN
60.0000 mg | Freq: Once | SUBCUTANEOUS | Status: AC
  Administered 2016-07-07: 60 mg via SUBCUTANEOUS
  Filled 2016-07-07: qty 1

## 2016-07-07 NOTE — Discharge Instructions (Signed)
Call Dr. Silvestre Mesi office for any problems or questions. [phone] Call 911 for any emergencies    Prolia Denosumab injection What is this medicine? DENOSUMAB (den oh sue mab) slows bone breakdown. Prolia is used to treat osteoporosis in women after menopause and in men. Delton See is used to treat a high calcium level due to cancer and to prevent bone fractures and other bone problems caused by multiple myeloma or cancer bone metastases. Delton See is also used to treat giant cell tumor of the bone. This medicine may be used for other purposes; ask your health care provider or pharmacist if you have questions. COMMON BRAND NAME(S): Prolia, XGEVA What should I tell my health care provider before I take this medicine? They need to know if you have any of these conditions: -dental disease -having surgery or tooth extraction -infection -kidney disease -low levels of calcium or Vitamin D in the blood -malnutrition -on hemodialysis -skin conditions or sensitivity -thyroid or parathyroid disease -an unusual reaction to denosumab, other medicines, foods, dyes, or preservatives -pregnant or trying to get pregnant -breast-feeding How should I use this medicine? This medicine is for injection under the skin. It is given by a health care professional in a hospital or clinic setting. If you are getting Prolia, a special MedGuide will be given to you by the pharmacist with each prescription and refill. Be sure to read this information carefully each time. For Prolia, talk to your pediatrician regarding the use of this medicine in children. Special care may be needed. For Delton See, talk to your pediatrician regarding the use of this medicine in children. While this drug may be prescribed for children as young as 13 years for selected conditions, precautions do apply. Overdosage: If you think you have taken too much of this medicine contact a poison control center or emergency room at once. NOTE: This medicine  is only for you. Do not share this medicine with others. What if I miss a dose? It is important not to miss your dose. Call your doctor or health care professional if you are unable to keep an appointment. What may interact with this medicine? Do not take this medicine with any of the following medications: -other medicines containing denosumab This medicine may also interact with the following medications: -medicines that lower your chance of fighting infection -steroid medicines like prednisone or cortisone This list may not describe all possible interactions. Give your health care provider a list of all the medicines, herbs, non-prescription drugs, or dietary supplements you use. Also tell them if you smoke, drink alcohol, or use illegal drugs. Some items may interact with your medicine. What should I watch for while using this medicine? Visit your doctor or health care professional for regular checks on your progress. Your doctor or health care professional may order blood tests and other tests to see how you are doing. Call your doctor or health care professional for advice if you get a fever, chills or sore throat, or other symptoms of a cold or flu. Do not treat yourself. This drug may decrease your body's ability to fight infection. Try to avoid being around people who are sick. You should make sure you get enough calcium and vitamin D while you are taking this medicine, unless your doctor tells you not to. Discuss the foods you eat and the vitamins you take with your health care professional. See your dentist regularly. Brush and floss your teeth as directed. Before you have any dental work done, tell your dentist  you are receiving this medicine. Do not become pregnant while taking this medicine or for 5 months after stopping it. Talk with your doctor or health care professional about your birth control options while taking this medicine. Women should inform their doctor if they wish to  become pregnant or think they might be pregnant. There is a potential for serious side effects to an unborn child. Talk to your health care professional or pharmacist for more information. What side effects may I notice from receiving this medicine? Side effects that you should report to your doctor or health care professional as soon as possible: -allergic reactions like skin rash, itching or hives, swelling of the face, lips, or tongue -bone pain -breathing problems -dizziness -jaw pain, especially after dental work -redness, blistering, peeling of the skin -signs and symptoms of infection like fever or chills; cough; sore throat; pain or trouble passing urine -signs of low calcium like fast heartbeat, muscle cramps or muscle pain; pain, tingling, numbness in the hands or feet; seizures -unusual bleeding or bruising -unusually weak or tired Side effects that usually do not require medical attention (report to your doctor or health care professional if they continue or are bothersome): -constipation -diarrhea -headache -joint pain -loss of appetite -muscle pain -runny nose -tiredness -upset stomach This list may not describe all possible side effects. Call your doctor for medical advice about side effects. You may report side effects to FDA at 1-800-FDA-1088. Where should I keep my medicine? This medicine is only given in a clinic, doctor's office, or other health care setting and will not be stored at home. NOTE: This sheet is a summary. It may not cover all possible information. If you have questions about this medicine, talk to your doctor, pharmacist, or health care provider.  2018 Elsevier/Gold Standard (2016-05-13 19:17:21)

## 2016-07-07 NOTE — Progress Notes (Signed)
Received call from pharmacy that patient's most updated labs 2/5 were 8.2 calcium, 2.09 serum creatinine, and 20 creatinine clearance. Anh (pharmacist) said MD must approve prolia with above labs. Called office and spoke to [place] who shared above with [name]. [name] stated it is fine for patient to receive Prolia today. Called pharmacy and made them aware.

## 2016-07-09 DIAGNOSIS — N183 Chronic kidney disease, stage 3 (moderate): Secondary | ICD-10-CM | POA: Diagnosis not present

## 2016-07-10 DIAGNOSIS — H26492 Other secondary cataract, left eye: Secondary | ICD-10-CM | POA: Diagnosis not present

## 2016-07-10 DIAGNOSIS — H353231 Exudative age-related macular degeneration, bilateral, with active choroidal neovascularization: Secondary | ICD-10-CM | POA: Diagnosis not present

## 2016-07-10 DIAGNOSIS — Z961 Presence of intraocular lens: Secondary | ICD-10-CM | POA: Diagnosis not present

## 2016-07-10 DIAGNOSIS — H04123 Dry eye syndrome of bilateral lacrimal glands: Secondary | ICD-10-CM | POA: Diagnosis not present

## 2016-08-16 ENCOUNTER — Other Ambulatory Visit: Payer: Self-pay | Admitting: Oncology

## 2016-08-16 ENCOUNTER — Other Ambulatory Visit: Payer: Self-pay | Admitting: Cardiovascular Disease

## 2016-08-18 DIAGNOSIS — L820 Inflamed seborrheic keratosis: Secondary | ICD-10-CM | POA: Diagnosis not present

## 2016-08-27 DIAGNOSIS — D518 Other vitamin B12 deficiency anemias: Secondary | ICD-10-CM | POA: Diagnosis not present

## 2016-08-27 DIAGNOSIS — N183 Chronic kidney disease, stage 3 (moderate): Secondary | ICD-10-CM | POA: Diagnosis not present

## 2016-08-27 DIAGNOSIS — R634 Abnormal weight loss: Secondary | ICD-10-CM | POA: Diagnosis not present

## 2016-08-27 DIAGNOSIS — R946 Abnormal results of thyroid function studies: Secondary | ICD-10-CM | POA: Diagnosis not present

## 2016-08-27 DIAGNOSIS — R7301 Impaired fasting glucose: Secondary | ICD-10-CM | POA: Diagnosis not present

## 2016-08-28 DIAGNOSIS — H353231 Exudative age-related macular degeneration, bilateral, with active choroidal neovascularization: Secondary | ICD-10-CM | POA: Diagnosis not present

## 2016-09-01 ENCOUNTER — Telehealth: Payer: Self-pay | Admitting: *Deleted

## 2016-09-01 NOTE — Telephone Encounter (Signed)
Dr [name] received lab results from Dr Perini's office - pt to "continue current dose of Hydrea". Called pt -no answer; left message on answering machine; and call for questions.

## 2016-09-17 ENCOUNTER — Ambulatory Visit: Payer: Medicare Other | Admitting: Podiatry

## 2016-09-22 ENCOUNTER — Encounter: Payer: Self-pay | Admitting: Cardiovascular Disease

## 2016-09-22 ENCOUNTER — Ambulatory Visit (INDEPENDENT_AMBULATORY_CARE_PROVIDER_SITE_OTHER): Payer: Medicare Other | Admitting: Cardiovascular Disease

## 2016-09-22 VITALS — BP 109/66 | HR 48 | Ht 67.0 in | Wt 128.6 lb

## 2016-09-22 DIAGNOSIS — I25700 Atherosclerosis of coronary artery bypass graft(s), unspecified, with unstable angina pectoris: Secondary | ICD-10-CM

## 2016-09-22 DIAGNOSIS — I200 Unstable angina: Secondary | ICD-10-CM

## 2016-09-22 DIAGNOSIS — I10 Essential (primary) hypertension: Secondary | ICD-10-CM

## 2016-09-22 DIAGNOSIS — R001 Bradycardia, unspecified: Secondary | ICD-10-CM | POA: Diagnosis not present

## 2016-09-22 DIAGNOSIS — I351 Nonrheumatic aortic (valve) insufficiency: Secondary | ICD-10-CM | POA: Diagnosis not present

## 2016-09-22 DIAGNOSIS — R634 Abnormal weight loss: Secondary | ICD-10-CM

## 2016-09-22 DIAGNOSIS — I359 Nonrheumatic aortic valve disorder, unspecified: Secondary | ICD-10-CM | POA: Diagnosis not present

## 2016-09-22 DIAGNOSIS — N184 Chronic kidney disease, stage 4 (severe): Secondary | ICD-10-CM | POA: Diagnosis not present

## 2016-09-22 MED ORDER — NITROGLYCERIN 0.4 MG SL SUBL: 0.4000 mg | SUBLINGUAL_TABLET | SUBLINGUAL | 1 refills | Status: DC | PRN

## 2016-09-22 MED ORDER — METOPROLOL TARTRATE 25 MG PO TABS: 12.5000 mg | ORAL_TABLET | Freq: Two times a day (BID) | ORAL | 3 refills | Status: DC

## 2016-09-22 MED ORDER — ZOLPIDEM TARTRATE 5 MG PO TABS: 5.0000 mg | ORAL_TABLET | Freq: Every evening | ORAL | 4 refills | Status: DC | PRN

## 2016-09-22 NOTE — Patient Instructions (Signed)
Your physician has requested that you have an echocardiogram. Echocardiography is a painless test that uses sound waves to create images of your heart. It provides your doctor with information about the size and shape of your heart and how well your heart's chambers and valves are working. This procedure takes approximately one hour. There are no restrictions for this procedure.  This will be done at the [place]. [employer].  Your physician has recommended you make the following change in your medication:   1.) the metoprolol has been decreased to 12.5 mg twice a day.  Your physician recommends that you schedule a follow-up appointment in: 4 months.

## 2016-09-22 NOTE — Progress Notes (Signed)
Patient ID: Alexander [name], Dr., male   DOB: 05/11/1921, 83 y.o.   MRN: [id number]     Primary M.D.: Dr. Joylene [name]  HPI: Dr Alexander [name] is a 83 y.o. male who presents to the office for a 7 month followup cardiologic evaluation.  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  In 1996 while living in [place] he underwent CABG surgery and had a LIMA placed to the LAD, vein to the marginal, vein to the ramus intermediate, and vein to the PDA. In October 2011 catheterization by me showed severe CAD with 41 - 80% ostial left main stenosis, total occlusion of the LAD after diagonal, 95% ramus intermediate stenosis, total occlusion of the marginal branch of the circumflex coronary artery, and severe diffuse native RCA disease with total occlusion of the PDA at the ostium. A graft that supplied the marginal was occluded as was the vein graft that supplied the ramus intermediate vessel. He had patent vein graft supplying the PDA branch of his RCA and a patent LIMA to the LAD. He has been on medical therapy. I did try adding Ranexa to his medical regimen but he did not tolerate this. He does have mild renal insufficiency with creatinines in the 1.5-1.6 range which have been followed by Dr. Hassell [name] an now Dr Alexander [name]. He has a hiatal hernia. He had been on Crestor as well as Vytorin in the past but admits to myalgias in the past on statins.  Recently, he had been on Zetia.  However, he has self discontinued the Zetia since he felt he was having some vague muscle aches.  Over the past year, he has continued to remain stable.  He is without anginal symptomatology.  He has not been exercising and walking regularly since he does not like to walk by himself.  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  He is unaware of any palpitations.  He denies presyncope or syncope.  He has renal insufficiency with creatinines in the 1.8 range.  He is now followed by Dr. Corbin [name] since Dr. Hassell [name]  retired.  His last lipid panel in December 2015 showed a total cholesterol 133, triglycerides 133, LDL 64, HDL 42.    He is being followed by Dr. Beryle [name] for elevated platelets and these have now stabilized with the addition of hydroxyurea.   He underwent an echo Doppler study on 09/24/2015 which showed an EF of 60-65%.  There was grade 2 diastolic dysfunction and high ventricular filling pressure.  He had moderate to severely calcified aortic valve annulus with mildly calcified leaflets and mild aortic insufficiency.  He specifically denies any chest pain.  He is unaware of palpitations.  There is mild shortness of breath with activity.  Recent lipid studies were [name] on 03/20/2016 and showed a total cholesterol 164, HDL 31, LDL 83, VLDL 50, and triglycerides 251.  He has chronic kidney disease and is followed by nephrology.  Creatinine in August 2017 was 1.92.    As I last saw him, he has been without chest pain.  He does note very mild shortness of breath with a walking or walking up steps.  A concern has been that of weight loss and he states that over the past year he has lost around 12-14 pounds.  He is followed by Dr. Baird [name] and in February 2018.  His creatinine had risen to 2.09.  He tells me that this was recently rechecked and  it had improved down to 1.9.  He admits to fatigue.  He continues to be on levothyroxine at 0.125 mg daily for hypothyroidism.  He is followed by hematology for his thrombocytosis and is on Hydrea.  He continues to be on metoprolol 25 g twice a day and losartan 25 mg.  He will be undergoing repeat blood work with Alexander [name] shortly.  He presents for evaluation He presents for evaluation  Past Medical History:  Diagnosis Date  . Allergic rhinitis   . Anemia   . Anemia, chronic renal failure 10/10/2013  . Anemia, normocytic normochromic 10/10/2013  . BPH (benign prostatic hyperplasia)   . Calcium oxalate renal stones   . CKD (chronic kidney disease), stage III   .  Diverticulitis   . Dry senile macular degeneration   . Essential thrombocythemia ([place]) 10/10/2013  . GERD (gastroesophageal reflux disease)   . Hearing loss in left ear   . Heart murmur   . Hyperlipidemia   . Hypertension   . Migraines    OCULAR  . Peripheral neuropathy   . Renal lesion   . Spinal stenosis   . Thrombocytosis ([place]) 08/18/2012   Platelets 711,000  Hb 14, WBC 8,500 08/05/12  . Vitamin deficiency     Past Surgical History:  Procedure Laterality Date  . APPENDECTOMY  1942  . CARDIAC CATHETERIZATION  02/19/2010   EF55%, medically treated,   . CORONARY ARTERY BYPASS GRAFT  1996  . INGUINAL HERNIA REPAIR     x2  . LAPAROSCOPIC CHOLECYSTECTOMY  1992  . left renal mass ablated  12/11  . NASAL CONCHA BULLOSA RESECTION    . NM MYOCAR PERF WALL MOTION  02/14/2010   post EF 71%, Exercise cap.7METS  . TONSILLECTOMY  1937  . TRANSTHORACIC ECHOCARDIOGRAM  12/10/2011   CVEL>38%, stage1 diastolic dysfunction, mild to mod. LAE,   . TRANSTHORACIC ECHOCARDIOGRAM  07/25/2008   mild-moderate MR, mild-modTricuspid regurg.    No Known Allergies  Current Outpatient Prescriptions  Medication Sig Dispense Refill  . Albuterol Sulfate (PROAIR RESPICLICK) 101 (90 Base) MCG/ACT AEPB Inhale 1 puff into the lungs every 6 (six) hours as needed. 1 each 0  . aluminum & magnesium hydroxide-simethicone (MYLANTA) 500-450-40 MG/5ML suspension Take by mouth every 6 (six) hours as needed. 200-200-26m/5ml     . aspirin 81 MG tablet Take 81 mg by mouth daily.      . calcium citrate (CALCITRATE - DOSED IN MG ELEMENTAL CALCIUM) 950 MG tablet Take 200 mg of elemental calcium by mouth daily.    . cholecalciferol (VITAMIN D) 1000 UNITS tablet Take 2,000 Units by mouth daily.     . Coenzyme Q10 (CO Q-10) 300 MG CAPS Take 1 capsule by mouth daily.    . Cyanocobalamin (VITAMIN B-12 IJ) Inject as directed. monthly     . finasteride (PROSCAR) 5 MG tablet Take 1 tablet by mouth 3 (three) times a week. TIW    .  hydroxyurea (HYDREA) 500 MG capsule TAKE 1 CAPSULE BY MOUTH  DAILY. MAY TAKE WITH FOOD  TO MINIMIZE GI SIDE  EFFECTS. 90 capsule 3  . losartan (COZAAR) 25 MG tablet TAKE 1 TABLET BY MOUTH  DAILY 90 tablet 0  . LUTEIN PO Take 6 mg by mouth daily.     . metoprolol tartrate (LOPRESSOR) 25 MG tablet Take 0.5 tablets (12.5 mg total) by mouth 2 (two) times daily. 90 tablet 3  . Multiple Vitamin (MULTIVITAMIN) tablet Take 1 tablet by mouth daily.      .[name]  Multiple Vitamins-Minerals (PRESERVISION AREDS PO) Take 1 capsule by mouth 2 (two) times daily.      . nitroGLYCERIN (NITROSTAT) 0.4 MG SL tablet Place 1 tablet (0.4 mg total) under the tongue every 5 (five) minutes as needed for chest pain. 25 tablet 1  . zolpidem (AMBIEN) 5 MG tablet Take 1 tablet (5 mg total) by mouth at bedtime as needed for sleep. 30 tablet 4   No current facility-administered medications for this visit.     Social History   Social History  . Marital status: Widowed    Spouse name: N/A  . Number of children: 3  . Years of education: N/A   Occupational History  . Not on file.   Social History Main Topics  . Smoking status: Never Smoker  . Smokeless tobacco: Never Used  . Alcohol use No  . Drug use: No  . Sexual activity: Not on file   Other Topics Concern  . Not on file   Social History Narrative  . No narrative on file    Socially he is widowed. He is retired Sales promotion account executive.  He has 3 children and 9 grandchildren. There's no tobacco use.   ROS General: Negative; No fevers, chills, or night sweats;  HEENT: Negative; No changes in vision or hearing, sinus congestion, difficulty swallowing Pulmonary: Negative; No cough, wheezing, shortness of breath, hemoptysis Cardiovascular:  See HPI; No chest pain, presyncope, syncope, palpitations GI: Positive for a large hiatal hernia; No nausea, vomiting, diarrhea, or abdominal pain GU: Negative; No dysuria, hematuria, or difficulty voiding Musculoskeletal:  Negative; no myalgias, joint pain, or weakness Hematologic/Oncology: He is followed by Dr. Beryle [name] for thrombocytosis.; no easy bruising, bleeding Endocrine: Negative; no heat/cold intolerance; no diabetes Neuro: Negative; no changes in balance, headaches Skin: Negative; No rashes or skin lesions Psychiatric: Negative; No behavioral problems, depression Sleep: Negative; No snoring, daytime sleepiness, hypersomnolence, bruxism, restless legs, hypnogognic hallucinations, no cataplexy Other comprehensive 14 point system review is negative.   PE BP 109/66   Pulse (!) 48   Ht _0  (1.702 m)   Wt 128 lb 9.6 oz (58.3 kg)   BMI 20.14 kg/m    Repeat blood pressure by me 130/64. Wt Readings from Last 3 Encounters:  09/22/16 128 lb 9.6 oz (58.3 kg)  07/07/16 130 lb (59 kg)  07/02/16 130 lb (59 kg)   General: Alert, oriented, no distress.  Skin: normal turgor, no rashes, warm and dry HEENT: Normocephalic, atraumatic. Pupils equal round and reactive to light; sclera anicteric; extraocular muscles intact;  Nose without nasal septal hypertrophy Mouth/Parynx benign; Mallinpatti scale 2 Neck: No JVD, no carotid bruits; normal carotid upstroke Lungs: clear to ausculatation and percussion; no wheezing or rales Chest wall: without tenderness to palpitation Heart: PMI not displaced, RRR, s1 s2 normal, no S3 YIRSWN;4/6 systolic murmur and diastolic murmur, no rubs, gallops, thrills, or heaves Abdomen: soft, nontender; no hepatosplenomehaly, BS+; abdominal aorta nontender and not dilated by palpation. Back: no CVA tenderness Pulses 2+ Musculoskeletal: full range of motion, normal strength, no joint deformities Extremities: no clubbing cyanosis or edema, Homan's sign negative  Neurologic: grossly nonfocal; Cranial nerves grossly wnl Psychologic: Normal mood and affect   ECG (independently read by me): Sinus bradycardia at 48 bpm.  PR interval 194 ms.  QTc interval 416 ms.  No ST segment  changes.  November 2017 ECG (independently read by me): Sinus bradycardia 51 bpm.  No ectopy.  Normal intervals.  April 2017 ECG (independently read by me):  Sinus bradycardia 53 bpm.  Poor progression anteriorly.  October 2016 ECG (independently read by me): Sinus bradycardia 54 bpm.  Poor progression V1 through V3.  Normal intervals.  September 2015 ECG (independently read by me): Sinus bradycardia 52 beats per minute.  Normal intervals.  No ST segment changes.  06/08/2013 ECG: Sinus bradycardia 49 beats per minute. No ectopy. Normal intervals.  Prior ECG of 11/30/2012: Sinus rhythm at 53 beats per minute. Normal intervals.  LABS: BMP Latest Ref Rng & Units 06/09/2016 12/26/2015 09/12/2015  Glucose 65 - 99 mg/dL 130(H) 122(H) 78  BUN 10 - 36 mg/dL 38(H) 39(H) 38(H)  Creatinine 0.76 - 1.27 mg/dL 2.09(H) 1.92(H) 1.76(H)  BUN/Creat Ratio 10 - _0 Sodium 134 - 144 mmol/L 139 143 145(H)  Potassium 3.5 - 5.2 mmol/L 4.6 5.0 5.1  Chloride 96 - 106 mmol/L 104 105 107(H)  CO2 18 - 29 mmol/L _1 Calcium 8.6 - 10.2 mg/dL 8.2(L) 9.1 9.5   Hepatic Function Latest Ref Rng & Units 06/09/2016 09/12/2015 05/23/2015  Total Protein 6.0 - 8.5 g/dL 6.8 6.6 6.4  Albumin 3.2 - 4.6 g/dL 4.3 4.2 4.2  AST 0 - 40 IU/L _2 ALT 0 - 44 IU/L _3 Alk Phosphatase 39 - 117 IU/L 83 56 66  Total Bilirubin 0.0 - 1.2 mg/dL 0.4 0.5 0.4   CBC Latest Ref Rng & Units 06/09/2016 12/26/2015 09/12/2015  WBC 3.4 - 10.8 x10E3/uL 5.4 4.2 4.9  Hemoglobin 13.0 - 17.0 g/dL - - -  Hematocrit 37.5 - 51.0 % 36.5(L) 37.0(L) 36.3(L)  Platelets 150 - 379 x10E3/uL 208 162 173   Lab Results  Component Value Date   MCV 115 (H) 06/09/2016   MCV 117 (H) 12/26/2015   MCV 115 (H) 09/12/2015   Lab Results  Component Value Date   TSH 4.049 05/01/2014  No results found for: HGBA1C   Lipid Panel     Component Value Date/Time   CHOL 164 03/20/2016 1109   CHOL 163 09/12/2015 1138   TRIG 251 (H) 03/20/2016 1109    HDL 31 (L) 03/20/2016 1109   HDL 33 (L) 09/12/2015 1138   CHOLHDL 5.3 (H) 03/20/2016 1109   VLDL 50 (H) 03/20/2016 1109   LDLCALC 83 03/20/2016 1109   LDLCALC 85 09/12/2015 1138    RADIOLOGY: No results found.   IMPRESSION:  1. Coronary artery disease involving coronary bypass graft of native heart with unstable angina pectoris ([place])   2. Nonrheumatic aortic valve insufficiency   3. Hypertension, unspecified type   4. Aortic valve calcification   5. Sinus bradycardia   6. Weight loss   7. 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 He has documented graft occlusion of 2 of his grafts and has severe native coronary artery disease. Remotely, we tried adding Ranexa to his medication but he has stopped this due to concerns of tolerability.  He has chronic kidney disease with his creatinines typically running at 1.9, although earlier this year, his creatinine had risen to 2.09.  He continues to be on very low dose losartan at 25 mg and if his kidney function worsens it may be necessary to discontinue this therapy.  He is not having any anginal symptomatology.  He is significant.  He bradycardic today with heart rates at 48.  I will reduce metoprolol to 12.5 mg twice a day.  He is on Hydrea for his thrombocytosis.  He has significant macrocytic indices.  He is followed by Dr. Beryle [name]. He continues to use Ambien for sleep.  His cardiac murmur sounds more louder today.  I am recommending follow-up evaluation.  I will try to get the blood work that he will have [name] at  [facility].  I will see him in 4 months for reevaluation.   Troy Sine, MD, [facility]  09/24/2016 7:24 PM

## 2016-09-24 ENCOUNTER — Encounter: Payer: Self-pay | Admitting: Podiatry

## 2016-09-24 ENCOUNTER — Ambulatory Visit (INDEPENDENT_AMBULATORY_CARE_PROVIDER_SITE_OTHER): Payer: Medicare Other | Admitting: Podiatry

## 2016-09-24 DIAGNOSIS — B351 Tinea unguium: Secondary | ICD-10-CM

## 2016-09-24 DIAGNOSIS — M79676 Pain in unspecified toe(s): Secondary | ICD-10-CM | POA: Diagnosis not present

## 2016-09-24 NOTE — Progress Notes (Signed)
Patient ID: Alexander [name], Dr., male   DOB: [birth year], 83 y.o.   MRN: [id number] Complaint:  Visit Type: Patient returns to my office for continued preventative foot care services. Complaint: Patient states" my nails have grown long and thick and become painful to walk and wear shoes" . He presents for preventative foot care services. No changes to ROS  Podiatric Exam: Vascular: dorsalis pedis and posterior tibial pulses are palpable bilateral. Capillary return is immediate. Temperature gradient is WNL. Skin turgor WNL  Sensorium: Normal Semmes Weinstein monofilament test. Normal tactile sensation bilaterally. Nail Exam: Pt has thick disfigured discolored nails with subungual debris noted bilateral entire nail hallux through fifth toenails Ulcer Exam: There is no evidence of ulcer or pre-ulcerative changes or infection. Orthopedic Exam: Muscle tone and strength are WNL. No limitations in general ROM. No crepitus or effusions noted. Foot type and digits show no abnormalities. Bony prominences are unremarkable. Skin: No Porokeratosis. No infection or ulcers  Diagnosis:  Tinea unguium, Pain in right toe, pain in left toes  Treatment & Plan Procedures and Treatment: Consent by patient was obtained for treatment procedures. The patient understood the discussion of treatment and procedures well. All questions were answered thoroughly reviewed. Debridement of mycotic and hypertrophic toenails, 1 through 5 bilateral and clearing of subungual debris. No ulceration, no infection noted.  Return Visit-Office Procedure: Patient instructed to return to the office for a follow up visit 10 weeks  for continued evaluation and treatment.   Gardiner Barefoot DPM

## 2016-09-25 DIAGNOSIS — I2510 Atherosclerotic heart disease of native coronary artery without angina pectoris: Secondary | ICD-10-CM | POA: Diagnosis not present

## 2016-09-25 DIAGNOSIS — N183 Chronic kidney disease, stage 3 (moderate): Secondary | ICD-10-CM | POA: Diagnosis not present

## 2016-09-25 DIAGNOSIS — M810 Age-related osteoporosis without current pathological fracture: Secondary | ICD-10-CM | POA: Diagnosis not present

## 2016-09-25 DIAGNOSIS — Z6821 Body mass index (BMI) 21.0-21.9, adult: Secondary | ICD-10-CM | POA: Diagnosis not present

## 2016-09-25 DIAGNOSIS — R634 Abnormal weight loss: Secondary | ICD-10-CM | POA: Diagnosis not present

## 2016-09-25 DIAGNOSIS — R1031 Right lower quadrant pain: Secondary | ICD-10-CM | POA: Diagnosis not present

## 2016-09-25 DIAGNOSIS — D473 Essential (hemorrhagic) thrombocythemia: Secondary | ICD-10-CM | POA: Diagnosis not present

## 2016-10-01 ENCOUNTER — Other Ambulatory Visit: Payer: Self-pay | Admitting: Gastroenterology

## 2016-10-01 DIAGNOSIS — Z23 Encounter for immunization: Secondary | ICD-10-CM | POA: Diagnosis not present

## 2016-10-01 DIAGNOSIS — R1031 Right lower quadrant pain: Secondary | ICD-10-CM

## 2016-10-03 DIAGNOSIS — H353231 Exudative age-related macular degeneration, bilateral, with active choroidal neovascularization: Secondary | ICD-10-CM | POA: Diagnosis not present

## 2016-10-03 DIAGNOSIS — Z961 Presence of intraocular lens: Secondary | ICD-10-CM | POA: Diagnosis not present

## 2016-10-03 DIAGNOSIS — H11823 Conjunctivochalasis, bilateral: Secondary | ICD-10-CM | POA: Diagnosis not present

## 2016-10-07 ENCOUNTER — Other Ambulatory Visit: Payer: Self-pay

## 2016-10-07 ENCOUNTER — Other Ambulatory Visit (HOSPITAL_COMMUNITY): Payer: Medicare Other

## 2016-10-07 ENCOUNTER — Ambulatory Visit (HOSPITAL_COMMUNITY): Payer: Medicare Other | Attending: Cardiovascular Disease

## 2016-10-07 DIAGNOSIS — I10 Essential (primary) hypertension: Secondary | ICD-10-CM | POA: Diagnosis not present

## 2016-10-07 DIAGNOSIS — N183 Chronic kidney disease, stage 3 (moderate): Secondary | ICD-10-CM | POA: Insufficient documentation

## 2016-10-07 DIAGNOSIS — E785 Hyperlipidemia, unspecified: Secondary | ICD-10-CM | POA: Insufficient documentation

## 2016-10-07 DIAGNOSIS — I351 Nonrheumatic aortic (valve) insufficiency: Secondary | ICD-10-CM

## 2016-10-07 DIAGNOSIS — I129 Hypertensive chronic kidney disease with stage 1 through stage 4 chronic kidney disease, or unspecified chronic kidney disease: Secondary | ICD-10-CM | POA: Diagnosis not present

## 2016-10-07 DIAGNOSIS — I359 Nonrheumatic aortic valve disorder, unspecified: Secondary | ICD-10-CM | POA: Diagnosis present

## 2016-10-07 DIAGNOSIS — I25700 Atherosclerosis of coronary artery bypass graft(s), unspecified, with unstable angina pectoris: Secondary | ICD-10-CM | POA: Diagnosis not present

## 2016-10-13 ENCOUNTER — Ambulatory Visit
Admission: RE | Admit: 2016-10-13 | Discharge: 2016-10-13 | Disposition: A | Discharge status: Home / Self Care | Payer: Medicare Other | Source: Ambulatory Visit | Attending: Gastroenterology | Admitting: Gastroenterology

## 2016-10-13 ENCOUNTER — Ambulatory Visit: Payer: Medicare Other | Admitting: Oncology

## 2016-10-13 DIAGNOSIS — K449 Diaphragmatic hernia without obstruction or gangrene: Secondary | ICD-10-CM | POA: Diagnosis not present

## 2016-10-13 DIAGNOSIS — R1031 Right lower quadrant pain: Secondary | ICD-10-CM

## 2016-10-14 ENCOUNTER — Other Ambulatory Visit: Payer: Self-pay | Admitting: *Deleted

## 2016-10-14 ENCOUNTER — Ambulatory Visit (INDEPENDENT_AMBULATORY_CARE_PROVIDER_SITE_OTHER): Payer: Medicare Other | Admitting: Oncology

## 2016-10-14 ENCOUNTER — Encounter: Payer: Self-pay | Admitting: Oncology

## 2016-10-14 VITALS — BP 116/50 | HR 50 | Temp 97.7°F | Wt 130.0 lb

## 2016-10-14 DIAGNOSIS — I2510 Atherosclerotic heart disease of native coronary artery without angina pectoris: Secondary | ICD-10-CM

## 2016-10-14 DIAGNOSIS — N189 Chronic kidney disease, unspecified: Secondary | ICD-10-CM | POA: Diagnosis not present

## 2016-10-14 DIAGNOSIS — Z7982 Long term (current) use of aspirin: Secondary | ICD-10-CM | POA: Diagnosis not present

## 2016-10-14 DIAGNOSIS — I351 Nonrheumatic aortic (valve) insufficiency: Secondary | ICD-10-CM

## 2016-10-14 DIAGNOSIS — D473 Essential (hemorrhagic) thrombocythemia: Secondary | ICD-10-CM

## 2016-10-14 DIAGNOSIS — Z951 Presence of aortocoronary bypass graft: Secondary | ICD-10-CM | POA: Diagnosis not present

## 2016-10-14 DIAGNOSIS — D631 Anemia in chronic kidney disease: Secondary | ICD-10-CM

## 2016-10-14 DIAGNOSIS — K449 Diaphragmatic hernia without obstruction or gangrene: Secondary | ICD-10-CM

## 2016-10-14 DIAGNOSIS — H9192 Unspecified hearing loss, left ear: Secondary | ICD-10-CM

## 2016-10-14 DIAGNOSIS — Z79899 Other long term (current) drug therapy: Secondary | ICD-10-CM

## 2016-10-14 NOTE — Patient Instructions (Signed)
To lab today CBC every 2 months Lab & visit 4 months

## 2016-10-14 NOTE — Progress Notes (Signed)
Hematology and Oncology Follow Up Visit  Alexander [name], Dr. [id number] 05-Apr-1922 83 y.o. 10/14/2016 3:19 PM   Principle Diagnosis: Encounter Diagnosis  Name Primary?  . 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 Platelet count running in the 7-800,000 range. He had no prior history of any thrombotic or neurologic events. I elected to start him on low-dose aspirin and observation. However, at the time of a 01/23/2014 visit, he reported  an acute change in hearing in his left ear. His ear nose and throat surgeon felt that this might represent a thrombotic event to a small vessel to his eighth nerve. I elected to start him on platelet lowering therapy with hydroxyurea at that time. Initial dose 500 mg daily. Dose initially increased but ultimately stabilized at just 500 mg daily with excellent control of his counts.    Interim History:   Overall he continues to do well. Appetite has not been as good. He eats small meals. He has lost some weight. He is trying to eat double meals now and feels that his weight is starting to come back up. He is had no change in bowel habit.  Recent follow-up with his cardiologist on May 21. He has a history of remote coronary bypass surgery. Subsequent occlusion of 2 of his grafts. Nonrheumatic aortic valve insufficiency. No new cardiorespiratory symptoms. In view of bradycardia at time of the visit, his metoprolol dose was decreased to 12.5 mg twice daily. Aortic murmur was more prominent on exam. He has not had any return in the hearing in his left ear. He denies any other neurologic signs or symptoms. Specifically no headache, change in vision, dysarthria, focal weakness, or paresthesias. I noticed that his memory is failing more and more every time I see  him.  Medications: reviewed  Allergies: No Known Allergies  Review of Systems: See interim history Remaining ROS negative:   Physical Exam: Blood pressure (!) 116/50, pulse (!) 50, temperature 97.7 F (36.5 C), temperature source Oral, weight 130 lb (59 kg), SpO2 95 %. Wt Readings from Last 3 Encounters:  10/14/16 130 lb (59 kg)  09/22/16 128 lb 9.6 oz (58.3 kg)  07/07/16 130 lb (59 kg)     General appearance:  Frail, elderly, Caucasian man HENNT: Pharynx no erythema, exudate, mass, or ulcer. No thyromegaly or thyroid nodules Lymph nodes: No cervical, supraclavicular, or axillary lymphadenopathy Breasts:  Lungs: Clear to auscultation, resonant to percussion throughout Heart: Regular rhythm, 2/6 aortic systolic murmur, no gallop, no rub, no click, no edema Abdomen: Soft, nontender, normal bowel sounds, no mass, spleen tip palpable in the left upper quadrant. No hepatomegaly. Extremities: No edema, no calf tenderness Musculoskeletal: no joint deformities GU:  Vascular: Carotid pulses 2+, no bruits, he Neurologic: Alert, oriented, poor memory. PERRLA, optic discs sharp and vessels normal, no hemorrhage or exudate, cranial nerves grossly normal, motor strength 5 over 5, reflexes 1+ symmetric at the biceps, absent symmetric at the knees,, upper body coordination normal, gait normal, Skin: Sallow but anicteric. No rash or ecchymosis  Lab Results: CBC W/Diff    Component Value Date/Time   WBC 5.4 06/09/2016 1142   WBC 5.1 11/22/2014 0906   RBC 3.18 (L) 06/09/2016 1142   RBC 3.28 (L) 11/22/2014 0906   HGB 12.1 (L) 06/09/2016 1142   HGB 12.7 (L) 03/18/2013  1437   HCT 36.5 (L) 06/09/2016 1142   HCT 39.3 03/18/2013 1437   PLT 208 06/09/2016 1142   MCV 115 (H) 06/09/2016 1142   MCV 95.4 03/18/2013 1437   MCH 38.1 (H) 06/09/2016 1142   MCH 38.1 (H) 11/22/2014 0906   MCHC 33.2 06/09/2016 1142   MCHC 33.8 11/22/2014 0906   RDW 13.9 06/09/2016 1142   RDW 15.0 (H) 03/18/2013 1437    LYMPHSABS 2.0 06/09/2016 1142   LYMPHSABS 2.3 03/18/2013 1437   MONOABS 0.6 11/22/2014 0906   MONOABS 0.7 03/18/2013 1437   EOSABS 0.1 06/09/2016 1142   BASOSABS 0.1 06/09/2016 1142   BASOSABS 0.3 (H) 03/18/2013 1437     Chemistry      Component Value Date/Time   NA 139 06/09/2016 1142   NA 142 03/18/2013 1437   K 4.6 06/09/2016 1142   K 4.5 03/18/2013 1437   CL 104 06/09/2016 1142   CL 108 (H) 09/01/2012 0950   CO2 20 06/09/2016 1142   CO2 23 03/18/2013 1437   BUN 38 (H) 06/09/2016 1142   BUN 28.2 (H) 03/18/2013 1437   CREATININE 2.09 (H) 06/09/2016 1142   CREATININE 1.74 (H) 05/01/2014 0955   CREATININE 1.6 (H) 03/18/2013 1437      Component Value Date/Time   CALCIUM 8.2 (L) 06/09/2016 1142   CALCIUM 8.9 08/07/2014 0939   CALCIUM 9.7 03/18/2013 1437   ALKPHOS 83 06/09/2016 1142   ALKPHOS 82 09/01/2012 0950   AST 19 06/09/2016 1142   AST 22 09/01/2012 0950   ALT 14 06/09/2016 1142   ALT 16 09/01/2012 0950   BILITOT 0.4 06/09/2016 1142   BILITOT 0.40 09/01/2012 0950       Radiological Studies: Mr Abdomen Wo Contrast:Routine study 10/13/2016. See chart for results. Stable large hiatus hernia. Postcholecystectomy changes. Spleen normal size.     Impression:  #1. Essential thrombocythemia Blood count stable on 500 mg of Hydrea daily plus aspirin 81 mg daily. Continue lab monitoring every 2 months.  #2. Anemia of chronic renal insufficiency-stable  #3. Coronary artery disease over 20 years status post bypass surgery.  #4. Hiatus hernia  CC: Patient Care Team: Alexander Infante, MD as PCP - General (Internal Medicine) Alexander Stain, MD as Attending Physician (Pulmonary Disease) Alexander Stain, MD as Consulting Physician (Pulmonary Disease) Alexander Sine, MD as Consulting Physician (Cardiology) Alexander Belt, MD as Consulting Physician (Hematology and Oncology) Alexander Campbell, MD as Consulting Physician (Gastroenterology)   Alexander Hopper, MD, Alexander [name]  Hematology-Oncology/Internal Medicine     6/12/20183:19 PM

## 2016-10-15 ENCOUNTER — Telehealth: Payer: Self-pay | Admitting: *Deleted

## 2016-10-15 LAB — CBC WITH DIFFERENTIAL/PLATELET
Basophils Absolute: 0.1 10*3/uL (ref 0.0–0.2)
Basos: 1 %
EOS (ABSOLUTE): 0.1 10*3/uL (ref 0.0–0.4)
Eos: 3 %
Hematocrit: 35.6 % — ABNORMAL LOW (ref 37.5–51.0)
Hemoglobin: 11.9 g/dL — ABNORMAL LOW (ref 13.0–17.7)
IMMATURE GRANS (ABS): 0 10*3/uL (ref 0.0–0.1)
Immature Granulocytes: 0 %
LYMPHS: 29 %
Lymphocytes Absolute: 1.5 10*3/uL (ref 0.7–3.1)
MCH: 39.3 pg — AB (ref 26.6–33.0)
MCHC: 33.4 g/dL (ref 31.5–35.7)
MCV: 118 fL — AB (ref 79–97)
MONOS ABS: 0.7 10*3/uL (ref 0.1–0.9)
Monocytes: 13 %
NEUTROS ABS: 2.7 10*3/uL (ref 1.4–7.0)
Neutrophils: 54 %
PLATELETS: 217 10*3/uL (ref 150–379)
RBC: 3.03 x10E6/uL — ABNORMAL LOW (ref 4.14–5.80)
RDW: 14.4 % (ref 12.3–15.4)
WBC: 5.1 10*3/uL (ref 3.4–10.8)

## 2016-10-15 LAB — CMP14 + ANION GAP
A/G RATIO: 1.7 (ref 1.2–2.2)
ALBUMIN: 4.1 g/dL (ref 3.2–4.6)
ALT: 13 IU/L (ref 0–44)
AST: 19 IU/L (ref 0–40)
Alkaline Phosphatase: 53 IU/L (ref 39–117)
Anion Gap: 13 mmol/L (ref 10.0–18.0)
BUN / CREAT RATIO: 22 (ref 10–24)
BUN: 39 mg/dL — AB (ref 10–36)
Bilirubin Total: 0.5 mg/dL (ref 0.0–1.2)
CHLORIDE: 108 mmol/L — AB (ref 96–106)
CO2: 20 mmol/L (ref 20–29)
Calcium: 8.8 mg/dL (ref 8.6–10.2)
Creatinine, Ser: 1.8 mg/dL — ABNORMAL HIGH (ref 0.76–1.27)
GFR, EST AFRICAN AMERICAN: 37 mL/min/{1.73_m2} — AB (ref >59)
GFR, EST NON AFRICAN AMERICAN: 32 mL/min/{1.73_m2} — AB (ref >59)
GLOBULIN, TOTAL: 2.4 g/dL (ref 1.5–4.5)
GLUCOSE: 86 mg/dL (ref 65–99)
POTASSIUM: 5.2 mmol/L (ref 3.5–5.2)
SODIUM: 141 mmol/L (ref 134–144)
TOTAL PROTEIN: 6.5 g/dL (ref 6.0–8.5)

## 2016-10-15 NOTE — Telephone Encounter (Signed)
-----   Message from Annia Belt, MD sent at 10/15/2016  3:41 PM EDT ----- Please call pt w lab results: platelets good @ 217,000. Hb stable at 11.9 (was 12 so no difference). Kidney function stable at baseline 1.8 (was 2.1 last time so better).Stay on current dose of Hydrea

## 2016-10-15 NOTE — Telephone Encounter (Signed)
Pt called- no answer; left message "platelets good @ 217,000. Hb stable at 11.9 (was 12 so no difference). Kidney function stable at baseline 1.8 (was 2.1 last time so better).Stay on current dose of Hydrea " per Dr [name]. And call for any questions.

## 2016-10-16 DIAGNOSIS — H353231 Exudative age-related macular degeneration, bilateral, with active choroidal neovascularization: Secondary | ICD-10-CM | POA: Diagnosis not present

## 2016-10-16 DIAGNOSIS — H353211 Exudative age-related macular degeneration, right eye, with active choroidal neovascularization: Secondary | ICD-10-CM | POA: Diagnosis not present

## 2016-10-18 ENCOUNTER — Other Ambulatory Visit: Payer: Self-pay | Admitting: Cardiovascular Disease

## 2016-11-11 DIAGNOSIS — Z6820 Body mass index (BMI) 20.0-20.9, adult: Secondary | ICD-10-CM | POA: Diagnosis not present

## 2016-11-11 DIAGNOSIS — J209 Acute bronchitis, unspecified: Secondary | ICD-10-CM | POA: Diagnosis not present

## 2016-11-11 DIAGNOSIS — R05 Cough: Secondary | ICD-10-CM | POA: Diagnosis not present

## 2016-11-17 DIAGNOSIS — L821 Other seborrheic keratosis: Secondary | ICD-10-CM | POA: Diagnosis not present

## 2016-11-17 DIAGNOSIS — L570 Actinic keratosis: Secondary | ICD-10-CM | POA: Diagnosis not present

## 2016-11-26 ENCOUNTER — Encounter: Payer: Self-pay | Admitting: Podiatry

## 2016-11-26 ENCOUNTER — Ambulatory Visit (INDEPENDENT_AMBULATORY_CARE_PROVIDER_SITE_OTHER): Payer: Medicare Other | Admitting: Podiatry

## 2016-11-26 DIAGNOSIS — M79676 Pain in unspecified toe(s): Secondary | ICD-10-CM | POA: Diagnosis not present

## 2016-11-26 DIAGNOSIS — B351 Tinea unguium: Secondary | ICD-10-CM | POA: Diagnosis not present

## 2016-11-26 DIAGNOSIS — M79674 Pain in right toe(s): Secondary | ICD-10-CM

## 2016-11-26 NOTE — Progress Notes (Signed)
Patient ID: Alexander [name], Dr., male   DOB: 05/14/1921, 83 y.o.   MRN: [id number] Complaint:  Visit Type: Patient returns to my office for continued preventative foot care services. Complaint: Patient states" my nails have grown long and thick and become painful to walk and wear shoes" . He presents for preventative foot care services. No changes to ROS  Podiatric Exam: Vascular: dorsalis pedis and posterior tibial pulses are palpable bilateral. Capillary return is immediate. Temperature gradient is WNL. Skin turgor WNL  Sensorium: Normal Semmes Weinstein monofilament test. Normal tactile sensation bilaterally. Nail Exam: Pt has thick disfigured discolored nails with subungual debris noted bilateral entire nail hallux through fifth toenails Ulcer Exam: There is no evidence of ulcer or pre-ulcerative changes or infection. Orthopedic Exam: Muscle tone and strength are WNL. No limitations in general ROM. No crepitus or effusions noted. Foot type and digits show no abnormalities. Bony prominences are unremarkable. Skin: No Porokeratosis. No infection or ulcers  Diagnosis:  Tinea unguium, Pain in right toe, pain in left toes  Treatment & Plan Procedures and Treatment: Consent by patient was obtained for treatment procedures. The patient understood the discussion of treatment and procedures well. All questions were answered thoroughly reviewed. Debridement of mycotic and hypertrophic toenails, 1 through 5 bilateral and clearing of subungual debris. No ulceration, no infection noted.  Return Visit-Office Procedure: Patient instructed to return to the office for a follow up visit 9 weeks  for continued evaluation and treatment.   Gardiner Barefoot DPM

## 2016-11-27 DIAGNOSIS — H353231 Exudative age-related macular degeneration, bilateral, with active choroidal neovascularization: Secondary | ICD-10-CM | POA: Diagnosis not present

## 2016-11-27 DIAGNOSIS — H04123 Dry eye syndrome of bilateral lacrimal glands: Secondary | ICD-10-CM | POA: Diagnosis not present

## 2016-11-27 DIAGNOSIS — H35361 Drusen (degenerative) of macula, right eye: Secondary | ICD-10-CM | POA: Diagnosis not present

## 2016-11-27 DIAGNOSIS — Z961 Presence of intraocular lens: Secondary | ICD-10-CM | POA: Diagnosis not present

## 2016-11-27 DIAGNOSIS — H26492 Other secondary cataract, left eye: Secondary | ICD-10-CM | POA: Diagnosis not present

## 2016-12-15 DIAGNOSIS — E784 Other hyperlipidemia: Secondary | ICD-10-CM | POA: Diagnosis not present

## 2016-12-15 DIAGNOSIS — D518 Other vitamin B12 deficiency anemias: Secondary | ICD-10-CM | POA: Diagnosis not present

## 2016-12-15 DIAGNOSIS — Z125 Encounter for screening for malignant neoplasm of prostate: Secondary | ICD-10-CM | POA: Diagnosis not present

## 2016-12-15 DIAGNOSIS — D519 Vitamin B12 deficiency anemia, unspecified: Secondary | ICD-10-CM | POA: Diagnosis not present

## 2016-12-15 DIAGNOSIS — N183 Chronic kidney disease, stage 3 (moderate): Secondary | ICD-10-CM | POA: Diagnosis not present

## 2016-12-15 DIAGNOSIS — M810 Age-related osteoporosis without current pathological fracture: Secondary | ICD-10-CM | POA: Diagnosis not present

## 2016-12-15 DIAGNOSIS — R946 Abnormal results of thyroid function studies: Secondary | ICD-10-CM | POA: Diagnosis not present

## 2016-12-15 DIAGNOSIS — R7301 Impaired fasting glucose: Secondary | ICD-10-CM | POA: Diagnosis not present

## 2016-12-22 DIAGNOSIS — Z1389 Encounter for screening for other disorder: Secondary | ICD-10-CM | POA: Diagnosis not present

## 2016-12-22 DIAGNOSIS — R7301 Impaired fasting glucose: Secondary | ICD-10-CM | POA: Diagnosis not present

## 2016-12-22 DIAGNOSIS — J45998 Other asthma: Secondary | ICD-10-CM | POA: Diagnosis not present

## 2016-12-22 DIAGNOSIS — N183 Chronic kidney disease, stage 3 (moderate): Secondary | ICD-10-CM | POA: Diagnosis not present

## 2016-12-22 DIAGNOSIS — E038 Other specified hypothyroidism: Secondary | ICD-10-CM | POA: Diagnosis not present

## 2016-12-22 DIAGNOSIS — D518 Other vitamin B12 deficiency anemias: Secondary | ICD-10-CM | POA: Diagnosis not present

## 2016-12-22 DIAGNOSIS — D473 Essential (hemorrhagic) thrombocythemia: Secondary | ICD-10-CM | POA: Diagnosis not present

## 2016-12-22 DIAGNOSIS — M810 Age-related osteoporosis without current pathological fracture: Secondary | ICD-10-CM | POA: Diagnosis not present

## 2016-12-22 DIAGNOSIS — Z0000 Encounter for general adult medical examination without abnormal findings: Secondary | ICD-10-CM | POA: Diagnosis not present

## 2016-12-22 DIAGNOSIS — E784 Other hyperlipidemia: Secondary | ICD-10-CM | POA: Diagnosis not present

## 2016-12-22 DIAGNOSIS — I2510 Atherosclerotic heart disease of native coronary artery without angina pectoris: Secondary | ICD-10-CM | POA: Diagnosis not present

## 2016-12-22 DIAGNOSIS — Z6820 Body mass index (BMI) 20.0-20.9, adult: Secondary | ICD-10-CM | POA: Diagnosis not present

## 2016-12-29 DIAGNOSIS — R2689 Other abnormalities of gait and mobility: Secondary | ICD-10-CM | POA: Diagnosis not present

## 2017-01-06 ENCOUNTER — Encounter (HOSPITAL_COMMUNITY)
Admission: RE | Admit: 2017-01-06 | Discharge: 2017-01-06 | Disposition: A | Discharge status: Home / Self Care | Payer: Medicare Other | Source: Ambulatory Visit | Attending: Internal Medicine | Admitting: Internal Medicine

## 2017-01-06 ENCOUNTER — Encounter (HOSPITAL_COMMUNITY): Payer: Self-pay

## 2017-01-06 DIAGNOSIS — M810 Age-related osteoporosis without current pathological fracture: Secondary | ICD-10-CM | POA: Diagnosis not present

## 2017-01-06 MED ORDER — DENOSUMAB 60 MG/ML ~~LOC~~ SOLN
60.0000 mg | Freq: Once | SUBCUTANEOUS | Status: AC
  Administered 2017-01-06: 60 mg via SUBCUTANEOUS
  Filled 2017-01-06: qty 1

## 2017-01-07 DIAGNOSIS — R2689 Other abnormalities of gait and mobility: Secondary | ICD-10-CM | POA: Diagnosis not present

## 2017-01-08 DIAGNOSIS — H353231 Exudative age-related macular degeneration, bilateral, with active choroidal neovascularization: Secondary | ICD-10-CM | POA: Diagnosis not present

## 2017-01-08 DIAGNOSIS — H04129 Dry eye syndrome of unspecified lacrimal gland: Secondary | ICD-10-CM | POA: Diagnosis not present

## 2017-01-08 DIAGNOSIS — Z961 Presence of intraocular lens: Secondary | ICD-10-CM | POA: Diagnosis not present

## 2017-01-08 DIAGNOSIS — H26492 Other secondary cataract, left eye: Secondary | ICD-10-CM | POA: Diagnosis not present

## 2017-01-08 DIAGNOSIS — H353211 Exudative age-related macular degeneration, right eye, with active choroidal neovascularization: Secondary | ICD-10-CM | POA: Diagnosis not present

## 2017-01-13 ENCOUNTER — Ambulatory Visit (INDEPENDENT_AMBULATORY_CARE_PROVIDER_SITE_OTHER): Payer: Medicare Other | Admitting: Orthopedic Surgery

## 2017-01-13 ENCOUNTER — Encounter (INDEPENDENT_AMBULATORY_CARE_PROVIDER_SITE_OTHER): Payer: Self-pay | Admitting: Orthopedic Surgery

## 2017-01-13 VITALS — BP 129/62 | HR 74 | Resp 14 | Ht 64.0 in | Wt 125.0 lb

## 2017-01-13 DIAGNOSIS — L600 Ingrowing nail: Secondary | ICD-10-CM

## 2017-01-13 NOTE — Progress Notes (Signed)
Office Visit Note   Patient: Alexander [name], Dr.           Date of Birth: Aug 29, 1921           MRN: [id number] Visit Date: 01/13/2017              Requested by: Crist Infante, MD [address] [place], [place] [address] PCP: Crist Infante, MD   Assessment & Plan: Visit Diagnoses:  1. Ingrown nail of great toe of right foot     Plan:  #1: Given a tube of mupirocin ointment take put to this toe twice a day #2: Follow back up next Tuesday to see Dr. Durward Fortes for discussion of outpatient nail ablation right great toe  Follow-Up Instructions: Return in about 1 week (around 01/20/2017).   Orders:  No orders of the defined types were placed in this encounter.  No orders of the defined types were placed in this encounter.     Procedures: No procedures performed   Clinical Data: No additional findings.   Subjective: Chief Complaint  Patient presents with  . 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 He is previously had problems with ingrown nails on his left great toe. On 03/17/2013 he had undergone a partial ablation of the great toenail both medially and laterally including the germinal nail bed. He states that in regards to his left foot he has been cured. He states though that for the past week he has been having similar symptoms on the right great toe and would like to have the same surgery performed. His pain is mainly on the lateral aspect of the right great toe. Seen for evaluation.    Review of Systems  Constitutional: Negative for fatigue.  HENT: Negative for hearing loss.   Respiratory: Negative for apnea, chest tightness and shortness of breath.   Cardiovascular: Negative for chest pain, palpitations and leg swelling.  Gastrointestinal: Negative for blood in stool, constipation and diarrhea.  Genitourinary:  Negative for difficulty urinating.  Musculoskeletal: Positive for back pain. Negative for arthralgias, joint swelling, myalgias, neck pain and neck stiffness.  Neurological: Negative for weakness, numbness and headaches.  Hematological: Does not bruise/bleed easily.  Psychiatric/Behavioral: Positive for sleep disturbance. The patient is not nervous/anxious.      Objective: Vital Signs: BP 129/62   Pulse 74   Resp 14   Ht 5\' 4"  (1.626 m)   Wt 125 lb (56.7 kg)   BMI 21.46 kg/m   Physical Exam  Constitutional: He is oriented to person, place, and time. He appears well-developed and well-nourished.  HENT:  Head: Normocephalic and atraumatic.  Eyes: Pupils are equal, round, and reactive to light. EOM are normal.  Pulmonary/Chest: Effort normal.  Neurological: He is alert and oriented to person, place, and time.  Skin: Skin is warm and dry.  Psychiatric: He has a normal mood and affect. His behavior is normal. Judgment and thought content normal.    Ortho Exam  His right great toe does have some ingrowing of the lateral portion of the great toenail. I do not really see much in way of erythema. He is certainly tender to palpation especially the distal portion. Medially does not have much pain. Good capillary refill. Sensation is intact to light touch.  Specialty Comments:  No specialty comments available.  Imaging: No results found.   PMFS History: Patient Active Problem List   Diagnosis Date Noted  . Essential thrombocythemia ([place]) 10/10/2013  . Anemia, normocytic normochromic 10/10/2013  . Anemia, chronic renal failure 10/10/2013  . Aortic valve insufficiency 06/12/2013  . CAD (coronary artery disease) of artery bypass graft 12/12/2012  . Hiatal hernia 12/12/2012  . Inguinal hernia, left 10/21/2012  . Thrombocytosis ([place]) 08/18/2012  . Moderate persistent asthma without complication 12/87/8676  . Hypertension   . Hyperlipidemia   . GERD (gastroesophageal reflux disease)    . Spinal stenosis   . Migraines   . CKD (chronic kidney disease), stage III   . BPH (benign prostatic hyperplasia)   . Heart murmur   . Calcium oxalate renal stones   . Allergic rhinitis   . Peripheral neuropathy    Past Medical History:  Diagnosis Date  . Allergic rhinitis   . Anemia   . Anemia, chronic renal failure 10/10/2013  . Anemia, normocytic normochromic 10/10/2013  . BPH (benign prostatic hyperplasia)   . Calcium oxalate renal stones   . CKD (chronic kidney disease), stage III   . Diverticulitis   . Dry senile macular degeneration   . Essential thrombocythemia ([place]) 10/10/2013  . GERD (gastroesophageal reflux disease)   . Hearing loss in left ear   . Heart murmur   . Hyperlipidemia   . Hypertension   . Migraines    OCULAR  . Peripheral neuropathy   . Renal lesion   . Spinal stenosis   . Thrombocytosis ([place]) 08/18/2012   Platelets 711,000  Hb 14, WBC 8,500 08/05/12  . Vitamin deficiency     Family History  Problem Relation Age of Onset  . Heart failure Mother   . Other Mother        Mitral Insuffiency  . Coronary artery disease Maternal Grandfather   . Heart attack Maternal Grandfather   . Other Father        Brain Meningioma  . Diabetes Father   . Heart failure Father   . Heart failure Sister   . Heart failure Maternal Grandmother   . Heart disease Maternal Grandmother   . Other Paternal Grandmother        pnumonia  . Allergies Daughter   . Asthma Grandchild     Past Surgical History:  Procedure Laterality Date  . APPENDECTOMY  1942  . CARDIAC CATHETERIZATION  02/19/2010   EF55%, medically treated,   . CORONARY ARTERY BYPASS GRAFT  1996  . INGUINAL HERNIA REPAIR     x2  . LAPAROSCOPIC CHOLECYSTECTOMY  1992  . left renal mass ablated  12/11  . NASAL CONCHA BULLOSA RESECTION    . NM MYOCAR PERF WALL MOTION  02/14/2010   post EF 71%, Exercise cap.7METS  . TONSILLECTOMY  1937  . TRANSTHORACIC ECHOCARDIOGRAM  12/10/2011   HMCN>47%, stage1 diastolic  dysfunction, mild to mod. LAE,   . TRANSTHORACIC ECHOCARDIOGRAM  07/25/2008   mild-moderate MR, mild-modTricuspid regurg.   Social History   Occupational History  . Not on file.   Social History Main Topics  . Smoking status: Never Smoker  . Smokeless tobacco: Never Used  . Alcohol use No  . Drug use: No  . Sexual activity: Not on file

## 2017-01-14 ENCOUNTER — Encounter (INDEPENDENT_AMBULATORY_CARE_PROVIDER_SITE_OTHER): Payer: Self-pay | Admitting: Orthopaedic Surgery

## 2017-01-17 ENCOUNTER — Other Ambulatory Visit: Payer: Self-pay | Admitting: Cardiovascular Disease

## 2017-01-20 ENCOUNTER — Ambulatory Visit (INDEPENDENT_AMBULATORY_CARE_PROVIDER_SITE_OTHER): Payer: Medicare Other | Admitting: Orthopaedic Surgery

## 2017-01-20 ENCOUNTER — Encounter (INDEPENDENT_AMBULATORY_CARE_PROVIDER_SITE_OTHER): Payer: Self-pay | Admitting: Orthopaedic Surgery

## 2017-01-20 VITALS — BP 114/51 | HR 58 | Resp 12 | Ht 64.0 in | Wt 124.0 lb

## 2017-01-20 DIAGNOSIS — L03031 Cellulitis of right toe: Secondary | ICD-10-CM | POA: Diagnosis not present

## 2017-01-20 DIAGNOSIS — I25700 Atherosclerosis of coronary artery bypass graft(s), unspecified, with unstable angina pectoris: Secondary | ICD-10-CM

## 2017-01-20 NOTE — Progress Notes (Signed)
Office Visit Note   Patient: Alexander [name], Dr.           Date of Birth: 27-Aug-1921           MRN: [id number] Visit Date: 01/20/2017              Requested by: Crist Infante, MD [address] [place], [place] [address] PCP: Crist Infante, MD   Assessment & Plan: Visit Diagnoses:  1. Paronychia of great toe of right foot   recurrent paronychia lateral aspect right great toenail Plan: we will plan partialexcision of the lateral right great toenail under IV sedation and local anesthesia in the next 1-2 weeks.I would also like to ablate the nailgerminal layer if possible to prevent recurrent growth of the lateral aspect of the nail  Follow-Up Instructions: Return will schedule surgery .   Orders:  No orders of the defined types were placed in this encounter.  No orders of the defined types were placed in this encounter.     Procedures: No procedures performed   Clinical Data: No additional findings.   Subjective: Chief Complaint  Patient presents with  . 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   had partial excision of the right great toenail in 2012 and has done well along the medial aspect of the nail but has had some recurrent ingrowing laterally.There has not been any drainage but has some induration and pain with some difficulty with foot wear. No injury trauma fever or chills. He would like to discuss partial excision of the lateral aspect of the right great toenail HPI  Review of Systems  Constitutional: Negative for fatigue.  HENT: Negative for hearing loss.   Respiratory: Negative for apnea, chest tightness and shortness of breath.   Cardiovascular: Negative for chest pain, palpitations and leg swelling.  Gastrointestinal: Negative for blood in stool, constipation and diarrhea.  Genitourinary: Negative for difficulty urinating.  Musculoskeletal: Negative for arthralgias, back pain, joint  swelling, myalgias, neck pain and neck stiffness.  Neurological: Negative for weakness, numbness and headaches.  Hematological: Does not bruise/bleed easily.  Psychiatric/Behavioral: Negative for sleep disturbance. The patient is not nervous/anxious.      Objective: Vital Signs: BP (!) 114/51   Pulse (!) 58   Resp 12   Ht 5\' 4"  (1.626 m)   Wt 124 lb (56.2 kg)   BMI 21.28 kg/m   Physical Exam  Ortho Examright great toe without active infection. There is some induration along the lateral border of the toenail. Mild tenderness. No obvious drainage or abscess formation. Good capillary refill. Normal sensibility. A little sensitivity along the medial aspect of the nail but there is no evidence of any ingrowing, induration or active infection.  Specialty Comments:  No specialty comments available.  Imaging: No results found.   PMFS History: Patient Active Problem List   Diagnosis Date Noted  . Essential thrombocythemia ([place]) 10/10/2013  . Anemia, normocytic normochromic 10/10/2013  . Anemia, chronic renal failure 10/10/2013  . Aortic valve insufficiency 06/12/2013  . CAD (coronary artery disease) of artery bypass graft 12/12/2012  . Hiatal hernia 12/12/2012  . Inguinal hernia, left 10/21/2012  . Thrombocytosis ([place]) 08/18/2012  . Moderate persistent asthma without complication 67/89/3810  . Hypertension   . Hyperlipidemia   . GERD (gastroesophageal reflux disease)   . Spinal stenosis   . Migraines   . CKD (  chronic kidney disease), stage III   . BPH (benign prostatic hyperplasia)   . Heart murmur   . Calcium oxalate renal stones   . Allergic rhinitis   . Peripheral neuropathy    Past Medical History:  Diagnosis Date  . Allergic rhinitis   . Anemia   . Anemia, chronic renal failure 10/10/2013  . Anemia, normocytic normochromic 10/10/2013  . BPH (benign prostatic hyperplasia)   . Calcium oxalate renal stones   . CKD (chronic kidney disease), stage III   . Diverticulitis    . Dry senile macular degeneration   . Essential thrombocythemia ([place]) 10/10/2013  . GERD (gastroesophageal reflux disease)   . Hearing loss in left ear   . Heart murmur   . Hyperlipidemia   . Hypertension   . Migraines    OCULAR  . Peripheral neuropathy   . Renal lesion   . Spinal stenosis   . Thrombocytosis ([place]) 08/18/2012   Platelets 711,000  Hb 14, WBC 8,500 08/05/12  . Vitamin deficiency     Family History  Problem Relation Age of Onset  . Heart failure Mother   . Other Mother        Mitral Insuffiency  . Coronary artery disease Maternal Grandfather   . Heart attack Maternal Grandfather   . Other Father        Brain Meningioma  . Diabetes Father   . Heart failure Father   . Heart failure Sister   . Heart failure Maternal Grandmother   . Heart disease Maternal Grandmother   . Other Paternal Grandmother        pnumonia  . Allergies Daughter   . Asthma Grandchild     Past Surgical History:  Procedure Laterality Date  . APPENDECTOMY  1942  . CARDIAC CATHETERIZATION  02/19/2010   EF55%, medically treated,   . CORONARY ARTERY BYPASS GRAFT  1996  . INGUINAL HERNIA REPAIR     x2  . LAPAROSCOPIC CHOLECYSTECTOMY  1992  . left renal mass ablated  12/11  . NASAL CONCHA BULLOSA RESECTION    . NM MYOCAR PERF WALL MOTION  02/14/2010   post EF 71%, Exercise cap.7METS  . TONSILLECTOMY  1937  . TRANSTHORACIC ECHOCARDIOGRAM  12/10/2011   CBUL>84%, stage1 diastolic dysfunction, mild to mod. LAE,   . TRANSTHORACIC ECHOCARDIOGRAM  07/25/2008   mild-moderate MR, mild-modTricuspid regurg.   Social History   Occupational History  . Not on file.   Social History Main Topics  . Smoking status: Never Smoker  . Smokeless tobacco: Never Used  . Alcohol use No  . Drug use: No  . Sexual activity: Not on file

## 2017-01-22 DIAGNOSIS — L600 Ingrowing nail: Secondary | ICD-10-CM | POA: Diagnosis not present

## 2017-01-22 DIAGNOSIS — L03031 Cellulitis of right toe: Secondary | ICD-10-CM | POA: Diagnosis not present

## 2017-01-26 ENCOUNTER — Encounter (INDEPENDENT_AMBULATORY_CARE_PROVIDER_SITE_OTHER): Payer: Self-pay | Admitting: Orthopaedic Surgery

## 2017-01-26 ENCOUNTER — Ambulatory Visit (INDEPENDENT_AMBULATORY_CARE_PROVIDER_SITE_OTHER): Payer: Medicare Other | Admitting: Orthopaedic Surgery

## 2017-01-26 VITALS — BP 109/60 | HR 61

## 2017-01-26 DIAGNOSIS — M79671 Pain in right foot: Secondary | ICD-10-CM

## 2017-01-26 NOTE — Progress Notes (Signed)
Office Visit Note   Patient: Alexander [name], Dr.           Date of Birth: Jul 02, 1921           MRN: [id number] Visit Date: 01/26/2017              Requested by: Crist Infante, MD [address] [place], [place] [address] PCP: Crist Infante, MD   Assessment & Plan: Visit Diagnoses:  1. Right foot pain     Plan: 4 days status post partial excision of the right great toenail laterally for recurrent paronychia doing well. Dressing change. Office 1 week to remove stitches  Follow-Up Instructions: Return in about 1 week (around 02/02/2017).   Orders:  No orders of the defined types were placed in this encounter.  No orders of the defined types were placed in this encounter.     Procedures: No procedures performed   Clinical Data: No additional findings.   Subjective: Chief Complaint  Patient presents with  . 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 No pain, wearing a shoe.  4 days status post partial excision of the left great toenail. I removed the lateral 20% of nail and the nailbed and germinal layer  HPI  Review of Systems  Constitutional: Negative for fatigue.  HENT: Negative for hearing loss.   Respiratory: Negative for apnea, chest tightness and shortness of breath.   Cardiovascular: Negative for chest pain, palpitations and leg swelling.  Gastrointestinal: Negative for blood in stool, constipation and diarrhea.  Genitourinary: Negative for difficulty urinating.  Musculoskeletal: Negative for arthralgias, back pain, joint swelling, myalgias, neck pain and neck stiffness.  Neurological: Negative for weakness, numbness and headaches.  Hematological: Does not bruise/bleed easily.  Psychiatric/Behavioral: Negative for sleep disturbance. The patient is not nervous/anxious.      Objective: Vital Signs: BP 109/60   Pulse 61   Physical Exam  Ortho Examdressing right great toe intact. No active drainage. No evidence of  infection. Neurovascular exam intact.  Specialty Comments:  No specialty comments available.  Imaging: No results found.   PMFS History: Patient Active Problem List   Diagnosis Date Noted  . Essential thrombocythemia ([place]) 10/10/2013  . Anemia, normocytic normochromic 10/10/2013  . Anemia, chronic renal failure 10/10/2013  . Aortic valve insufficiency 06/12/2013  . CAD (coronary artery disease) of artery bypass graft 12/12/2012  . Hiatal hernia 12/12/2012  . Inguinal hernia, left 10/21/2012  . Thrombocytosis ([place]) 08/18/2012  . Moderate persistent asthma without complication 58/52/7782  . Hypertension   . Hyperlipidemia   . GERD (gastroesophageal reflux disease)   . Spinal stenosis   . Migraines   . CKD (chronic kidney disease), stage III   . BPH (benign prostatic hyperplasia)   . Heart murmur   . Calcium oxalate renal stones   . Allergic rhinitis   . Peripheral neuropathy    Past Medical History:  Diagnosis Date  . Allergic rhinitis   . Anemia   . Anemia, chronic renal failure 10/10/2013  . Anemia, normocytic normochromic 10/10/2013  . BPH (benign prostatic hyperplasia)   . Calcium oxalate renal stones   . CKD (chronic kidney disease), stage III   . Diverticulitis   . Dry senile macular degeneration   . Essential thrombocythemia ([place]) 10/10/2013  . GERD (gastroesophageal reflux disease)   . Hearing loss in left ear   . Heart murmur   . Hyperlipidemia   .  Hypertension   . Migraines    OCULAR  . Peripheral neuropathy   . Renal lesion   . Spinal stenosis   . Thrombocytosis ([place]) 08/18/2012   Platelets 711,000  Hb 14, WBC 8,500 08/05/12  . Vitamin deficiency     Family History  Problem Relation Age of Onset  . Heart failure Mother   . Other Mother        Mitral Insuffiency  . Coronary artery disease Maternal Grandfather   . Heart attack Maternal Grandfather   . Other Father        Brain Meningioma  . Diabetes Father   . Heart failure Father   . Heart failure  Sister   . Heart failure Maternal Grandmother   . Heart disease Maternal Grandmother   . Other Paternal Grandmother        pnumonia  . Allergies Daughter   . Asthma Grandchild     Past Surgical History:  Procedure Laterality Date  . APPENDECTOMY  1942  . CARDIAC CATHETERIZATION  02/19/2010   EF55%, medically treated,   . CORONARY ARTERY BYPASS GRAFT  1996  . INGUINAL HERNIA REPAIR     x2  . LAPAROSCOPIC CHOLECYSTECTOMY  1992  . left renal mass ablated  12/11  . NASAL CONCHA BULLOSA RESECTION    . NM MYOCAR PERF WALL MOTION  02/14/2010   post EF 71%, Exercise cap.7METS  . TONSILLECTOMY  1937  . TRANSTHORACIC ECHOCARDIOGRAM  12/10/2011   DIYM>41%, stage1 diastolic dysfunction, mild to mod. LAE,   . TRANSTHORACIC ECHOCARDIOGRAM  07/25/2008   mild-moderate MR, mild-modTricuspid regurg.   Social History   Occupational History  . Not on file.   Social History Main Topics  . Smoking status: Never Smoker  . Smokeless tobacco: Never Used  . Alcohol use No  . Drug use: No  . Sexual activity: Not on file

## 2017-01-27 ENCOUNTER — Ambulatory Visit (INDEPENDENT_AMBULATORY_CARE_PROVIDER_SITE_OTHER): Payer: Medicare Other | Admitting: Cardiovascular Disease

## 2017-01-27 ENCOUNTER — Encounter: Payer: Self-pay | Admitting: Cardiovascular Disease

## 2017-01-27 VITALS — BP 110/64 | HR 64 | Wt 126.0 lb

## 2017-01-27 DIAGNOSIS — I10 Essential (primary) hypertension: Secondary | ICD-10-CM | POA: Diagnosis not present

## 2017-01-27 DIAGNOSIS — I25700 Atherosclerosis of coronary artery bypass graft(s), unspecified, with unstable angina pectoris: Secondary | ICD-10-CM

## 2017-01-27 DIAGNOSIS — N183 Chronic kidney disease, stage 3 unspecified: Secondary | ICD-10-CM

## 2017-01-27 DIAGNOSIS — G479 Sleep disorder, unspecified: Secondary | ICD-10-CM | POA: Diagnosis not present

## 2017-01-27 DIAGNOSIS — E782 Mixed hyperlipidemia: Secondary | ICD-10-CM

## 2017-01-27 DIAGNOSIS — I200 Unstable angina: Secondary | ICD-10-CM

## 2017-01-27 MED ORDER — ZOLPIDEM TARTRATE ER 6.25 MG PO TBCR: 6.2500 mg | EXTENDED_RELEASE_TABLET | Freq: Every evening | ORAL | 3 refills | Status: DC | PRN

## 2017-01-27 NOTE — Patient Instructions (Signed)
Medication Instructions:  STOP zolpidem   START zolpidem ER 6.25 mg at night as needed for sleep maintenance   Follow-Up: Your physician wants you to follow-up in: 6 MONTHS with Dr. Claiborne Billings. You will receive a reminder letter in the mail two months in advance. If you don't receive a letter, please call our office to schedule the follow-up appointment.  Any Other Special Instructions Will Be Listed Below (If Applicable).     If you need a refill on your cardiac medications before your next appointment, please call your pharmacy.

## 2017-01-27 NOTE — Progress Notes (Signed)
Patient ID: Alexander [name], Alexander., male   DOB: 01-07-1922, 83 y.o.   MRN: [id number]     Primary M.D.: Alexander [name]  HPI: Alexander [name] is a 82 y.o. male who presents to the office for a 4 month followup cardiologic evaluation.  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  In 1996 while living in [place] he underwent CABG surgery and had a LIMA placed to the LAD, vein to the marginal, vein to the ramus intermediate, and vein to the PDA. In October 2011 catheterization by me showed severe CAD with 21 - 80% ostial left main stenosis, total occlusion of the LAD after diagonal, 95% ramus intermediate stenosis, total occlusion of the marginal branch of the circumflex coronary artery, and severe diffuse native RCA disease with total occlusion of the PDA at the ostium. A graft that supplied the marginal was occluded as was the vein graft that supplied the ramus intermediate vessel. He had patent vein graft supplying the PDA branch of his RCA and a patent LIMA to the LAD. He has been on medical therapy. I did try adding Ranexa to his medical regimen but he did not tolerate this. He does have mild renal insufficiency with creatinines in the 1.5-1.6 range which have been followed by Alexander [name] an now Alexander [name]. He has a hiatal hernia. He had been on Crestor as well as Vytorin in the past but admits to myalgias in the past on statins.  Recently, he had been on Zetia.  However, he has self discontinued the Zetia since he felt he was having some vague muscle aches.  Over the past year, he has continued to remain stable.  He is without anginal symptomatology.  He has not been exercising and walking regularly since he does not like to walk by himself.  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  He is unaware of any palpitations.  He denies presyncope or syncope.  He has renal insufficiency with creatinines in the 1.8 range.  He is now followed by Alexander [name] since Alexander [name]  retired.  His last lipid panel in December 2015 showed a total cholesterol 133, triglycerides 133, LDL 64, HDL 42.    He is being followed by Alexander [name] for elevated platelets and these have now stabilized with the addition of hydroxyurea.   He underwent an echo Doppler study on 09/24/2015 which showed an EF of 60-65%.  There was grade 2 diastolic dysfunction and high ventricular filling pressure.  He had moderate to severely calcified aortic valve annulus with mildly calcified leaflets and mild aortic insufficiency.  He specifically denies any chest pain.  He is unaware of palpitations.  There is mild shortness of breath with activity.  Recent lipid studies were [name] on 03/20/2016 and showed a total cholesterol 164, HDL 31, LDL 83, VLDL 50, and triglycerides 251.  He has chronic kidney disease and is followed by nephrology.  Creatinine in August 2017 was 1.92.    He has been without chest pain.  He does note very mild shortness of breath with a walking or walking up steps.  A concern has been that of weight loss and he states that over the past year he has lost around 12-14 pounds.  He is followed by Alexander. Baird [name] and in February 2018 creatinine had risen to 2.09.  He tells me that this was recently rechecked and it had improved down to 1.9.  He admits to fatigue.  He continues to be on levothyroxine at 0.125 mg daily for hypothyroidism.  He is followed by hematology for his thrombocytosis and is on Hydrea.  When I last saw him, he was bradycardic and I recommended reduction of his metoprolol dose down to 12.5 mg twice a day.  He has felt well with this adjustment.  He underwent an echo Doppler study on 10/07/2016, which continue to show normal systolic function with an EF of 55-60%.  There was restricted mobility of his noncoronary cusp as she is aortic valve with mild aortic insufficiency.  There was mild pulmonary hypertension with a PA pressure 37 mm.  There was grade 1 diastolic dysfunction.  His peak  aortic gradient 6 mm.  Subsequent blood work in June showed further improvement in his creatinine to 1.80.  He has been having difficulty with sleep maintenance.  Typically he takes zolpidem 5 mg at bedtime, but usually within 4 hours he is back awake and has had difficulty with sleep maintenance.  Last week he underwent surgery on his right toe due to ingrown toenail by Alexander [name] and tolerated surgery well including general anesthesia without cardiovascular compromise.  He presents for evaluation.  Past Medical History:  Diagnosis Date  . Allergic rhinitis   . Anemia   . Anemia, chronic renal failure 10/10/2013  . Anemia, normocytic normochromic 10/10/2013  . BPH (benign prostatic hyperplasia)   . Calcium oxalate renal stones   . CKD (chronic kidney disease), stage III   . Diverticulitis   . Dry senile macular degeneration   . Essential thrombocythemia ([place]) 10/10/2013  . GERD (gastroesophageal reflux disease)   . Hearing loss in left ear   . Heart murmur   . Hyperlipidemia   . Hypertension   . Migraines    OCULAR  . Peripheral neuropathy   . Renal lesion   . Spinal stenosis   . Thrombocytosis ([place]) 08/18/2012   Platelets 711,000  Hb 14, WBC 8,500 08/05/12  . Vitamin deficiency     Past Surgical History:  Procedure Laterality Date  . APPENDECTOMY  1942  . CARDIAC CATHETERIZATION  02/19/2010   EF55%, medically treated,   . CORONARY ARTERY BYPASS GRAFT  1996  . INGUINAL HERNIA REPAIR     x2  . LAPAROSCOPIC CHOLECYSTECTOMY  1992  . left renal mass ablated  12/11  . NASAL CONCHA BULLOSA RESECTION    . NM MYOCAR PERF WALL MOTION  02/14/2010   post EF 71%, Exercise cap.7METS  . TONSILLECTOMY  1937  . TRANSTHORACIC ECHOCARDIOGRAM  12/10/2011   EAVW>09%, stage1 diastolic dysfunction, mild to mod. LAE,   . TRANSTHORACIC ECHOCARDIOGRAM  07/25/2008   mild-moderate MR, mild-modTricuspid regurg.    No Known Allergies  Current Outpatient Prescriptions  Medication Sig Dispense  Refill  . aluminum & magnesium hydroxide-simethicone (MYLANTA) 500-450-40 MG/5ML suspension Take by mouth every 6 (six) hours as needed. 200-200-39m/5ml     . aspirin 81 MG tablet Take 81 mg by mouth daily.      . calcium citrate (CALCITRATE - DOSED IN MG ELEMENTAL CALCIUM) 950 MG tablet Take 200 mg of elemental calcium by mouth daily.    . cholecalciferol (VITAMIN D) 1000 UNITS tablet Take 2,000 Units by mouth daily.     . Coenzyme Q10 (CO Q-10) 300 MG CAPS Take 1 capsule by mouth daily.    . Cyanocobalamin (VITAMIN B-12 IJ) Inject as directed. monthly     . finasteride (PROSCAR) 5 MG tablet Take  1 tablet by mouth 3 (three) times a week. TIW    . hydroxyurea (HYDREA) 500 MG capsule TAKE 1 CAPSULE BY MOUTH  DAILY. MAY TAKE WITH FOOD  TO MINIMIZE GI SIDE  EFFECTS. 90 capsule 3  . levothyroxine (SYNTHROID, LEVOTHROID) 25 MCG tablet Take 25 mcg by mouth daily before breakfast.    . losartan (COZAAR) 25 MG tablet TAKE 1 TABLET BY MOUTH  DAILY 90 tablet 0  . LUTEIN PO Take 6 mg by mouth daily.     . metoprolol tartrate (LOPRESSOR) 25 MG tablet Take 0.5 tablets (12.5 mg total) by mouth 2 (two) times daily. 30 tablet 6  . Multiple Vitamin (MULTIVITAMIN) tablet Take 1 tablet by mouth daily.      . Multiple Vitamins-Minerals (PRESERVISION AREDS PO) Take 1 capsule by mouth 2 (two) times daily.      [name] zolpidem (AMBIEN CR) 6.25 MG CR tablet Take 1 tablet (6.25 mg total) by mouth at bedtime as needed for sleep. For sleep maintenance 30 tablet 3   No current facility-administered medications for this visit.     Social History   Social History  . Marital status: Widowed    Spouse name: N/A  . Number of children: 3  . Years of education: N/A   Occupational History  . Not on file.   Social History Main Topics  . Smoking status: Never Smoker  . Smokeless tobacco: Never Used  . Alcohol use No  . Drug use: No  . Sexual activity: Not on file   Other Topics Concern  . Not on file   Social History  Narrative  . No narrative on file    Socially he is widowed. He is retired Sales promotion account executive.  He has 3 children and 9 grandchildren. There's no tobacco use.   ROS General: Negative; No fevers, chills, or night sweats;  HEENT: Negative; No changes in vision or hearing, sinus congestion, difficulty swallowing Pulmonary: Negative; No cough, wheezing, shortness of breath, hemoptysis Cardiovascular:  See HPI; No chest pain, presyncope, syncope, palpitations GI: Positive for a large hiatal hernia; No nausea, vomiting, diarrhea, or abdominal pain GU: Negative; No dysuria, hematuria, or difficulty voiding Musculoskeletal: Negative; no myalgias, joint pain, or weakness Hematologic/Oncology: He is followed by Alexander [name] for thrombocytosis.; no easy bruising, bleeding Endocrine: Negative; no heat/cold intolerance; no diabetes Neuro: Negative; no changes in balance, headaches Skin: Negative; No rashes or skin lesions Psychiatric: Negative; No behavioral problems, depression Sleep: Negative; No snoring, daytime sleepiness, hypersomnolence, bruxism, restless legs, hypnogognic hallucinations, no cataplexy Other comprehensive 14 point system review is negative.   PE BP 110/64   Pulse 64   Wt 126 lb (57.2 kg)   BMI 21.63 kg/m    Repeat blood pressure by me 114/66  Wt Readings from Last 3 Encounters:  01/27/17 126 lb (57.2 kg)  01/20/17 124 lb (56.2 kg)  01/13/17 125 lb (56.7 kg)   General: Alert, oriented, no distress.  Skin: normal turgor, no rashes, warm and dry HEENT: Normocephalic, atraumatic. Pupils equal round and reactive to light; sclera anicteric; extraocular muscles intact;  Nose without nasal septal hypertrophy Mouth/Parynx benign; Mallinpatti scale 2 Neck: No JVD, no carotid bruits; normal carotid upstroke Lungs: clear to ausculatation and percussion; no wheezing or rales Chest wall: without tenderness to palpitation Heart: PMI not displaced, RRR, s1 s2  normal, 2/6 systolic murmur, 1/6 diastolic murmur, no rubs, gallops, thrills, or heaves Abdomen: soft, nontender; no hepatosplenomehaly, BS+; abdominal aorta nontender and not dilated by  palpation. Back: no CVA tenderness Pulses 2+ Musculoskeletal: full range of motion, normal strength, no joint deformities Extremities: no clubbing cyanosis or edema, Homan's sign negative  Neurologic: grossly nonfocal; Cranial nerves grossly wnl Psychologic: Normal mood and affect; normal cognition   ECG (independently read by me): Normal sinus rhythm at 64 bpm.  Probable left atrial dilatation.  Poor anterior R-wave progression.  QTc interval 410 ms.  May 2018 ECG (independently read by me): Sinus bradycardia at 48 bpm.  PR interval 194 ms.  QTc interval 416 ms.  No ST segment changes.  November 2017 ECG (independently read by me): Sinus bradycardia 51 bpm.  No ectopy.  Normal intervals.  April 2017 ECG (independently read by me): Sinus bradycardia 53 bpm.  Poor progression anteriorly.  October 2016 ECG (independently read by me): Sinus bradycardia 54 bpm.  Poor progression V1 through V3.  Normal intervals.  September 2015 ECG (independently read by me): Sinus bradycardia 52 beats per minute.  Normal intervals.  No ST segment changes.  06/08/2013 ECG: Sinus bradycardia 49 beats per minute. No ectopy. Normal intervals.  Prior ECG of 11/30/2012: Sinus rhythm at 53 beats per minute. Normal intervals.  LABS: BMP Latest Ref Rng & Units 10/14/2016 06/09/2016 12/26/2015  Glucose 65 - 99 mg/dL 86 130(H) 122(H)  BUN 10 - 36 mg/dL 39(H) 38(H) 39(H)  Creatinine 0.76 - 1.27 mg/dL 1.80(H) 2.09(H) 1.92(H)  BUN/Creat Ratio 10 - _0 Sodium 134 - 144 mmol/L 141 139 143  Potassium 3.5 - 5.2 mmol/L 5.2 4.6 5.0  Chloride 96 - 106 mmol/L 108(H) 104 105  CO2 20 - 29 mmol/L _1 Calcium 8.6 - 10.2 mg/dL 8.8 8.2(L) 9.1   Hepatic Function Latest Ref Rng & Units 10/14/2016 06/09/2016 09/12/2015  Total Protein 6.0  - 8.5 g/dL 6.5 6.8 6.6  Albumin 3.2 - 4.6 g/dL 4.1 4.3 4.2  AST 0 - 40 IU/L _2 ALT 0 - 44 IU/L _3 Alk Phosphatase 39 - 117 IU/L 53 83 56  Total Bilirubin 0.0 - 1.2 mg/dL 0.5 0.4 0.5   CBC Latest Ref Rng & Units 10/14/2016 06/09/2016 12/26/2015  WBC 3.4 - 10.8 x10E3/uL 5.1 5.4 4.2  Hemoglobin 13.0 - 17.7 g/dL 11.9(L) 12.1(L) 12.3(L)  Hematocrit 37.5 - 51.0 % 35.6(L) 36.5(L) 37.0(L)  Platelets 150 - 379 x10E3/uL 217 208 162   Lab Results  Component Value Date   MCV 118 (H) 10/14/2016   MCV 115 (H) 06/09/2016   MCV 117 (H) 12/26/2015   Lab Results  Component Value Date   TSH 4.049 05/01/2014  No results found for: HGBA1C   Lipid Panel     Component Value Date/Time   CHOL 164 03/20/2016 1109   CHOL 163 09/12/2015 1138   TRIG 251 (H) 03/20/2016 1109   HDL 31 (L) 03/20/2016 1109   HDL 33 (L) 09/12/2015 1138   CHOLHDL 5.3 (H) 03/20/2016 1109   VLDL 50 (H) 03/20/2016 1109   LDLCALC 83 03/20/2016 1109   LDLCALC 85 09/12/2015 1138    RADIOLOGY: No results found.   IMPRESSION:  1. Coronary artery disease involving coronary bypass graft of native heart with unstable angina pectoris ([place])   2. Hypertension, unspecified type   3. Mixed hyperlipidemia   4. CKD (chronic kidney disease), stage III b   5. 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 He has documented graft occlusion of 2 of his grafts and has severe native coronary artery disease. Remotely, we tried adding Ranexa to his medication but he has stopped this due to concerns of tolerability.  When I last saw him, he was bradycardic with heart rates in the upper 40s.  His heart rate is now 64 on his reduced dose of metoprolol.  I reviewed his echo Doppler dated with him in detail.  He continues to have normal systolic function with grade 1 diastolic  dysfunction.  He does have aortic sclerosis with mild restriction of the non-coronary cusp mobility.  There was mild aortic insufficiency.  His LV is normal size.  He has chronic kidney disease, but this appears to have stabilized with most recent creatinine at 1.3.  His blood pressure today is stable.  He's not having any palpitations.  He continues to be on low-dose levothyroxine for mild hypothyroidism.  He has a hiatal hernia.  He is had difficulty with sleep maintenance and the short acting zolpidem 5 mg has been effective for sleep initiation.  I will try to change this to extended release zolpidem at 6.25 mg, which would be beneficial for sleep maintenance.  In addition to his sleep initiation.  As long as he remains stable I will see him in 6 months for evaluation.Troy Sine, MD, [facility]  01/27/2017 6:29 PM

## 2017-01-28 ENCOUNTER — Other Ambulatory Visit: Payer: Self-pay | Admitting: Oncology

## 2017-01-28 ENCOUNTER — Encounter: Payer: Self-pay | Admitting: Podiatry

## 2017-01-28 ENCOUNTER — Ambulatory Visit (INDEPENDENT_AMBULATORY_CARE_PROVIDER_SITE_OTHER): Payer: Medicare Other | Admitting: Podiatry

## 2017-01-28 DIAGNOSIS — M79676 Pain in unspecified toe(s): Secondary | ICD-10-CM

## 2017-01-28 DIAGNOSIS — B351 Tinea unguium: Secondary | ICD-10-CM

## 2017-01-28 NOTE — Progress Notes (Signed)
Patient ID: [name], Dr., male   DOB: 02-06-1922, 83 y.o.   MRN: [id number] Complaint:  Visit Type: Patient returns to my office for continued preventative foot care services. Complaint: Patient states" my nails have grown long and thick and become painful to walk and wear shoes" . He presents for preventative foot care services. No changes to ROS.  Patient is under care for ingrown toenail right big toe by Dr.  Durward Fortes.  He requests that I leave his bandage on his right foot.  Podiatric Exam: Vascular: dorsalis pedis and posterior tibial pulses are palpable bilateral. Capillary return is immediate. Temperature gradient is WNL. Skin turgor WNL  Sensorium: Normal Semmes Weinstein monofilament test. Normal tactile sensation bilaterally. Nail Exam: Pt has thick disfigured discolored nails with subungual debris noted bilateral entire nail hallux through fifth toenails Ulcer Exam: There is no evidence of ulcer or pre-ulcerative changes or infection. Orthopedic Exam: Muscle tone and strength are WNL. No limitations in general ROM. No crepitus or effusions noted. Foot type and digits show no abnormalities. Bony prominences are unremarkable. Skin: No Porokeratosis. No infection or ulcers  Diagnosis:  Tinea unguium, Pain in right toe, pain in left toes  Treatment & Plan Procedures and Treatment: Consent by patient was obtained for treatment procedures. The patient understood the discussion of treatment and procedures well. All questions were answered thoroughly reviewed. Debridement of mycotic and hypertrophic toenails, 1 through 5 bilateral and clearing of subungual debris. No ulceration, no infection noted.  Return Visit-Office Procedure: Patient instructed to return to the office for a follow up visit 9 weeks  for continued evaluation and treatment.   Gardiner Barefoot DPM

## 2017-01-29 ENCOUNTER — Other Ambulatory Visit: Payer: Self-pay

## 2017-01-29 MED ORDER — ZOLPIDEM TARTRATE ER 6.25 MG PO TBCR: 6.2500 mg | EXTENDED_RELEASE_TABLET | Freq: Every evening | ORAL | 3 refills | Status: DC | PRN

## 2017-02-02 ENCOUNTER — Inpatient Hospital Stay (INDEPENDENT_AMBULATORY_CARE_PROVIDER_SITE_OTHER): Payer: Medicare Other | Admitting: Orthopaedic Surgery

## 2017-02-02 ENCOUNTER — Ambulatory Visit (INDEPENDENT_AMBULATORY_CARE_PROVIDER_SITE_OTHER): Payer: Medicare Other | Admitting: Orthopaedic Surgery

## 2017-02-02 ENCOUNTER — Ambulatory Visit (INDEPENDENT_AMBULATORY_CARE_PROVIDER_SITE_OTHER): Payer: Medicare Other | Admitting: Neurology

## 2017-02-02 ENCOUNTER — Encounter: Payer: Self-pay | Admitting: Neurology

## 2017-02-02 VITALS — BP 126/50 | HR 68 | Ht 65.0 in | Wt 120.0 lb

## 2017-02-02 DIAGNOSIS — I25700 Atherosclerosis of coronary artery bypass graft(s), unspecified, with unstable angina pectoris: Secondary | ICD-10-CM | POA: Diagnosis not present

## 2017-02-02 DIAGNOSIS — R2689 Other abnormalities of gait and mobility: Secondary | ICD-10-CM | POA: Diagnosis not present

## 2017-02-02 DIAGNOSIS — M79671 Pain in right foot: Secondary | ICD-10-CM

## 2017-02-02 NOTE — Patient Instructions (Addendum)
Thank you for choosing [facility] for your neurological care! It was nice to meet you today! I appreciate that you entrust me with your healthcare concerns. I hope, I was able to address at least some of your concerns today, and that I can help you feel reassured.    Here is what we discussed today and what we came up with as our plan for you: Your exam looks good.  In particular, you have no signs of one sided symptoms or signs and no evidence of parkinsonism.  Your should continue to exercise and build your strength and stamina.  Please try to supplement your nutrition.  As discussed, I will order a brain scan, called MRI and call you with the test results. We will have to schedule you for this on a separate date. This test requires authorization from your insurance, and we will take care of the insurance process. We will call you with the results; so long as it is age appropriate, I can see you back as needed.

## 2017-02-02 NOTE — Progress Notes (Signed)
Office Visit Note   Patient: Alexander [name], Dr.           Date of Birth: 02-18-1922           MRN: [id number] Visit Date: 02/02/2017              Requested by: Crist Infante, MD [address] [place], [place] [address] PCP: Crist Infante, MD   Assessment & Plan: Visit Diagnoses:  1. Right foot pain     Plan: 10 days status post ablation of the lateral 25% of right great toenail for chronic ingrowing and doing well.  Follow-Up Instructions: Return in about 2 weeks (around 02/16/2017).   Orders:  No orders of the defined types were placed in this encounter.  No orders of the defined types were placed in this encounter.     Procedures: No procedures performed   Clinical Data: No additional findings.   Subjective: No chief complaint on file. no fever or chills. Still having some pain.  HPI  Review of Systems   Objective: Vital Signs: There were no vitals taken for this visit.  Physical Exam  Ortho Examright great toe without evidence of infection. 2 Ethilon stitches removed. Normal sensibility. Minimal swelling. Some tenderness beneath the incision as expected  Specialty Comments:  No specialty comments available.  Imaging: No results found.   PMFS History: Patient Active Problem List   Diagnosis Date Noted  . Essential thrombocythemia ([place]) 10/10/2013  . Anemia, normocytic normochromic 10/10/2013  . Anemia, chronic renal failure 10/10/2013  . Aortic valve insufficiency 06/12/2013  . CAD (coronary artery disease) of artery bypass graft 12/12/2012  . Hiatal hernia 12/12/2012  . Inguinal hernia, left 10/21/2012  . Thrombocytosis ([place]) 08/18/2012  . Moderate persistent asthma without complication 36/14/4315  . Hypertension   . Hyperlipidemia   . GERD (gastroesophageal reflux disease)   . Spinal stenosis   . Migraines   . CKD (chronic kidney disease), stage III ([place])   . BPH (benign prostatic hyperplasia)   . Heart murmur   . Calcium oxalate  renal stones   . Allergic rhinitis   . Peripheral neuropathy    Past Medical History:  Diagnosis Date  . Allergic rhinitis   . Anemia   . Anemia, chronic renal failure 10/10/2013  . Anemia, normocytic normochromic 10/10/2013  . BPH (benign prostatic hyperplasia)   . Calcium oxalate renal stones   . CKD (chronic kidney disease), stage III   . Diverticulitis   . Dry senile macular degeneration   . Essential thrombocythemia ([place]) 10/10/2013  . GERD (gastroesophageal reflux disease)   . Hearing loss in left ear   . Heart murmur   . Hyperlipidemia   . Hypertension   . Migraines    OCULAR  . Peripheral neuropathy   . Renal lesion   . Spinal stenosis   . Thrombocytosis ([place]) 08/18/2012   Platelets 711,000  Hb 14, WBC 8,500 08/05/12  . Vitamin deficiency     Family History  Problem Relation Age of Onset  . Heart failure Mother   . Other Mother        Mitral Insuffiency  . Coronary artery disease Maternal Grandfather   . Heart attack Maternal Grandfather   . Other Father        Brain Meningioma  . Diabetes Father   . Heart failure Father   . Heart failure Sister   . Heart failure Maternal Grandmother   . Heart disease Maternal Grandmother   . Other  Paternal Grandmother        pnumonia  . Allergies Daughter   . Asthma Grandchild     Past Surgical History:  Procedure Laterality Date  . APPENDECTOMY  1942  . CARDIAC CATHETERIZATION  02/19/2010   EF55%, medically treated,   . CORONARY ARTERY BYPASS GRAFT  1996  . INGUINAL HERNIA REPAIR     x2  . LAPAROSCOPIC CHOLECYSTECTOMY  1992  . left renal mass ablated  12/11  . NASAL CONCHA BULLOSA RESECTION    . NM MYOCAR PERF WALL MOTION  02/14/2010   post EF 71%, Exercise cap.7METS  . TONSILLECTOMY  1937  . TRANSTHORACIC ECHOCARDIOGRAM  12/10/2011   LTRV>20%, stage1 diastolic dysfunction, mild to mod. LAE,   . TRANSTHORACIC ECHOCARDIOGRAM  07/25/2008   mild-moderate MR, mild-modTricuspid regurg.   Social History   Occupational  History  . Not on file.   Social History Main Topics  . Smoking status: Never Smoker  . Smokeless tobacco: Never Used  . Alcohol use No  . Drug use: No  . Sexual activity: Not on file     Garald Balding, MD   Note - This record has been created using [employer].  Chart creation errors have been sought, but may not always  have been located. Such creation errors do not reflect on  the standard of medical care.

## 2017-02-02 NOTE — Progress Notes (Signed)
Subjective:    Patient ID: Alexander [name], Dr. is a 83 y.o. male.  HPI     Star Age, MD, PhD [facility] [address]O. [place], [place] [address]  Dear [name],   I saw your patient, Alexander [name], upon your kind request, in my neurologic clinic today for initial consultation of his gait and balance problems. The patient is unaccompanied today. 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 He feels a non-specific sense of imbalance, not safe with turns, no lightheadedness, no headaches, no vertiginous spells. He takes a herbal supplement with melatonin for sleep, no more Ambien CR.  He had a recent abd MRI on 10/13/16, which I reviewed: IMPRESSION: 1. Stable large hiatal hernia containing the entire stomach with organo-axial volvulus. 2. Stable cryoablation changes involving the left kidney. Bilateral renal cysts appear stable. 3. Stable chronic pancreatic atrophy and cystic changes possibly the sequela of prior pancreatitis. 4. No obvious acute abdominal findings, mass lesions or lymphadenopathy. I reviewed your office note from 12/22/2016. He has not had any recent falls. He has had recent weight loss over months, and fatigue. He has 3 children. Moved from [place] to [place] in 2005.  He had a brain MRI without contrast on 05/01/2013 which I reviewed: IMPRESSION: No cause of sudden left-sided hearing loss identified. Chronic small-vessel changes of the hemispheric white matter, similar to the previous studies with mild time related  progression.  He has had some physical therapy. He has not used a walker but used to cane in the past which did not help.   His Past Medical History Is Significant For: Past Medical History:  Diagnosis Date  . Allergic rhinitis   . Anemia   . Anemia, chronic renal failure 10/10/2013  . Anemia, normocytic normochromic 10/10/2013  . BPH (benign prostatic hyperplasia)   . Calcium oxalate renal stones   . CKD (chronic kidney disease), stage III ([place])   . Diverticulitis   . Dry senile macular degeneration   . Essential thrombocythemia ([place]) 10/10/2013  . GERD (gastroesophageal reflux disease)   . Hearing loss in left ear   . Heart murmur   . Hyperlipidemia   . Hypertension   . Migraines    OCULAR  . Peripheral neuropathy   . Renal lesion   . Spinal stenosis   . Thrombocytosis ([place]) 08/18/2012   Platelets 711,000  Hb 14, WBC 8,500 08/05/12  . Vitamin deficiency     His Past Surgical History Is Significant For: Past Surgical History:  Procedure Laterality Date  . APPENDECTOMY  1942  . CARDIAC CATHETERIZATION  02/19/2010   EF55%, medically treated,   . CORONARY ARTERY BYPASS GRAFT  1996  . INGUINAL HERNIA REPAIR     x2  . LAPAROSCOPIC CHOLECYSTECTOMY  1992  . left renal mass ablated  12/11  . NASAL CONCHA BULLOSA RESECTION    . NM MYOCAR PERF WALL MOTION  02/14/2010   post EF 71%, Exercise cap.7METS  . TONSILLECTOMY  1937  . TRANSTHORACIC ECHOCARDIOGRAM  12/10/2011   XVQM>08%, stage1 diastolic dysfunction, mild to mod. LAE,   . TRANSTHORACIC ECHOCARDIOGRAM  07/25/2008  mild-moderate MR, mild-modTricuspid regurg.    His Family History Is Significant For: Family History  Problem Relation Age of Onset  . Heart failure Mother   . Other Mother        Mitral Insuffiency  . Coronary artery disease Maternal Grandfather   . Heart attack Maternal Grandfather   . Other Father        Brain Meningioma  . Diabetes Father   . Heart failure Father   . Heart failure Sister   . Heart  failure Maternal Grandmother   . Heart disease Maternal Grandmother   . Other Paternal Grandmother        pnumonia  . Allergies Daughter   . Asthma Grandchild     His Social History Is Significant For: Social History   Social History  . Marital status: Widowed    Spouse name: N/A  . Number of children: 3  . Years of education: N/A   Social History Main Topics  . Smoking status: Never Smoker  . Smokeless tobacco: Never Used  . Alcohol use No  . Drug use: No  . Sexual activity: Not Asked   Other Topics Concern  . None   Social History Narrative  . None    His Allergies Are:  No Known Allergies:   His Current Medications Are:  Outpatient Encounter Prescriptions as of 02/02/2017  Medication Sig  . aluminum & magnesium hydroxide-simethicone (MYLANTA) 500-450-40 MG/5ML suspension Take by mouth every 6 (six) hours as needed. 200-200-20mg /30ml   . aspirin 81 MG tablet Take 81 mg by mouth daily.    . calcium citrate (CALCITRATE - DOSED IN MG ELEMENTAL CALCIUM) 950 MG tablet Take 200 mg of elemental calcium by mouth daily.  . cholecalciferol (VITAMIN D) 1000 UNITS tablet Take 2,000 Units by mouth daily.   . Coenzyme Q10 (CO Q-10) 300 MG CAPS Take 1 capsule by mouth daily.  . Cyanocobalamin (VITAMIN B-12 IJ) Inject as directed. monthly   . denosumab (PROLIA) 60 MG/ML SOLN injection Inject 60 mg into the skin every 6 (six) months. Administer in upper arm, thigh, or abdomen  . ezetimibe (ZETIA) 10 MG tablet Take 10 mg by mouth daily.  . finasteride (PROSCAR) 5 MG tablet Take 1 tablet by mouth 3 (three) times a week. TIW  . hydroxyurea (HYDREA) 500 MG capsule TAKE 1 CAPSULE BY MOUTH  DAILY. MAY TAKE WITH FOOD  TO MINIMIZE GI SIDE  EFFECTS.  [name] levothyroxine (SYNTHROID, LEVOTHROID) 25 MCG tablet Take 25 mcg by mouth daily before breakfast.  . losartan (COZAAR) 25 MG tablet TAKE 1 TABLET BY MOUTH  DAILY  . LUTEIN PO Take 6 mg by mouth daily.   . metoprolol tartrate (LOPRESSOR) 25 MG  tablet Take 0.5 tablets (12.5 mg total) by mouth 2 (two) times daily. (Patient taking differently: Take 25 mg by mouth daily. )  . Multiple Vitamin (MULTIVITAMIN) tablet Take 1 tablet by mouth daily.    . Multiple Vitamins-Minerals (PRESERVISION AREDS PO) Take 1 capsule by mouth 2 (two) times daily.    [name] zolpidem (AMBIEN CR) 6.25 MG CR tablet Take 1 tablet (6.25 mg total) by mouth at bedtime as needed for sleep. For sleep maintenance   No facility-administered encounter medications on file as of 02/02/2017.   : Review of Systems:  Out of a complete 14 point review of systems, all are reviewed and negative with the exception of these symptoms as listed below: Review of Systems  Neurological:       Pt  presents today to discuss his balance. Pt reports being off balance for more than 2 years. Pt denies any recent falls.    Objective:  Neurological Exam  Physical Exam Physical Examination:   Vitals:   02/02/17 1437  BP: (!) 126/50  Pulse: 68    General Examination: The patient is a very pleasant 83 y.o. male in no acute distress. He appears well-developed and well-nourished and well groomed.   HEENT: Normocephalic, atraumatic, pupils are equal, round and reactive to light and accommodation. Funduscopic exam is normal with sharp disc margins noted. Extraocular tracking is good without limitation to gaze excursion or nystagmus noted. Normal smooth pursuit is noted. Hearing is grossly intact. Tympanic membranes are clear bilaterally. Face is symmetric with normal facial animation and normal facial sensation. Speech is clear with no dysarthria noted. There is no hypophonia. There is no lip, neck/head, jaw or voice tremor. Neck is supple with full range of passive and active motion. There are no carotid bruits on auscultation. Oropharynx exam reveals: mild mouth dryness, adequate dental hygiene. Mallampati is class I. Tongue protrudes centrally and palate elevates symmetrically. Tonsils are absent.    Chest: Clear to auscultation without wheezing, rhonchi or crackles noted.  Heart: S1+S2+0, regular and normal without murmurs, rubs or gallops noted.   Abdomen: Soft, non-tender and non-distended with normal bowel sounds appreciated on auscultation.  Extremities: There is no pitting edema in the distal lower extremities bilaterally. Pedal pulses are intact.  Skin: Warm and dry without trophic changes noted.  Musculoskeletal: exam reveals no obvious joint deformities, tenderness or joint swelling or erythema, right foot and a bandage/small boot.   Neurologically:  Mental status: The patient is awake, alert and oriented in all 4 spheres. His immediate and remote memory, attention, language skills and fund of knowledge are appropriate. There is no evidence of aphasia, agnosia, apraxia or anomia. Speech is clear with normal prosody and enunciation. Thought process is linear. Mood is normal and affect is normal.  Cranial nerves II - XII are as described above under HEENT exam. In addition: shoulder shrug is normal with equal shoulder height noted. Motor exam: Normal bulk, strength and tone is noted. There is no drift, tremor or rebound. Romberg is negative. Reflexes are 1+, but absent in the ankles. Fine motor skills and coordination: intact with normal finger taps, normal hand movements, normal rapid alternating patting, normal foot taps and normal foot agility, some limitation on the right side due to bandage.  Cerebellar testing: No dysmetria or intention tremor on finger to nose testing. Heel to shin is unremarkable bilaterally. There is no truncal or gait ataxia.  Sensory exam: intact to light touch, pinprick, vibration, temperature sense in the upper and lower extremities.  Gait, station and balance: He stands easily. No veering to one side is noted. No leaning to one side is noted. Posture is age-appropriate and stance is narrow based. Gait shows normal stride length and normal pace. No  problems turning are noted. He turns in 3 steps.   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 He feels a non-specific sense of imbalance, no vertigo,  no HAs. On examination, he has a nonfocal neurological exam, in fact, his balance and fine motor skills are probably a little bit above average for age. He has certainly no signs of parkinsonism, history not suggestive of vertigo. Thankfully, he has not fallen. He had a brain MRI without contrast nearly 4 years ago we can certainly do a repeat test for comparison to rule out an underlying structural cause. We would have to do the MRI without contrast because of impaired kidney function. He is agreeable to this. We will call him with his test results. I suggested he continue to try to stay active mentally and physically, try to ensure good nutrition and hydration. I suggested as needed follow-up so long as his MRI is age-appropriate/stable. I answered all his questions today and he was in agreement. Thank you very much for allowing me to participate in the care of this nice patient. If I can be of any further assistance to you please do not hesitate to call me at [phone].  Sincerely,   Star Age, MD, PhD

## 2017-02-09 DIAGNOSIS — D3002 Benign neoplasm of left kidney: Secondary | ICD-10-CM | POA: Diagnosis not present

## 2017-02-16 ENCOUNTER — Ambulatory Visit (INDEPENDENT_AMBULATORY_CARE_PROVIDER_SITE_OTHER): Payer: Medicare Other | Admitting: Orthopaedic Surgery

## 2017-02-16 ENCOUNTER — Encounter (INDEPENDENT_AMBULATORY_CARE_PROVIDER_SITE_OTHER): Payer: Self-pay | Admitting: Orthopaedic Surgery

## 2017-02-16 VITALS — BP 117/66 | HR 55

## 2017-02-16 DIAGNOSIS — M79671 Pain in right foot: Secondary | ICD-10-CM

## 2017-02-16 NOTE — Progress Notes (Signed)
Office Visit Note   Patient: Alexander [name], Dr.           Date of Birth: Sep 07, 1921           MRN: [id number] Visit Date: 02/16/2017              Requested by: Crist Infante, MD [address] [place], [place] [address] PCP: Crist Infante, MD   Assessment & Plan: Visit Diagnoses:  1. Right foot pain   chronic, recurrent ingrown great toenail  Plan: Nearly a month status post partial ablation of the right great toenail laterally and doing well. Preoperative symptoms have resolved. Activities as tolerated with regular shoes. Office as needed  Follow-Up Instructions: Return if symptoms worsen or fail to improve.   Orders:  No orders of the defined types were placed in this encounter.  No orders of the defined types were placed in this encounter.     Procedures: No procedures performed   Clinical Data: No additional findings.   Subjective: Chief Complaint  Patient presents with  . Follow-up    R FOOT F/U SEEMS OK HAS A SORE SPOT ON GREAT TOE  Right foot pain seems to have resolved. No complications from surgery. Wearing comfortable shoes without a problem  HPI  Review of Systems   Objective: Vital Signs: BP 117/66 (BP Location: Left Arm, Patient Position: Sitting, Cuff Size: Normal)   Pulse (!) 55   Physical Exam  Ortho Exam awake alert and oriented 3 comfortable sitting and walking. No limp. Right great toe is healing without complications. No further induration along the lateral aspect of the nail. No drainage. Toe is nice and pink with normal sensibility. No pain around the great toe. No evidence of any infection  Specialty Comments:  No specialty comments available.  Imaging: No results found.   PMFS History: Patient Active Problem List   Diagnosis Date Noted  . Essential thrombocythemia ([place]) 10/10/2013  . Anemia, normocytic normochromic 10/10/2013  . Anemia, chronic renal failure 10/10/2013  . Aortic valve insufficiency 06/12/2013  . CAD  (coronary artery disease) of artery bypass graft 12/12/2012  . Hiatal hernia 12/12/2012  . Inguinal hernia, left 10/21/2012  . Thrombocytosis ([place]) 08/18/2012  . Moderate persistent asthma without complication 82/99/3716  . Hypertension   . Hyperlipidemia   . GERD (gastroesophageal reflux disease)   . Spinal stenosis   . Migraines   . CKD (chronic kidney disease), stage III ([place])   . BPH (benign prostatic hyperplasia)   . Heart murmur   . Calcium oxalate renal stones   . Allergic rhinitis   . Peripheral neuropathy    Past Medical History:  Diagnosis Date  . Allergic rhinitis   . Anemia   . Anemia, chronic renal failure 10/10/2013  . Anemia, normocytic normochromic 10/10/2013  . BPH (benign prostatic hyperplasia)   . Calcium oxalate renal stones   . CKD (chronic kidney disease), stage III ([place])   . Diverticulitis   . Dry senile macular degeneration   . Essential thrombocythemia ([place]) 10/10/2013  . GERD (gastroesophageal reflux disease)   . Hearing loss in left ear   . Heart murmur   . Hyperlipidemia   . Hypertension   . Migraines    OCULAR  . Peripheral neuropathy   . Renal lesion   . Spinal stenosis   . Thrombocytosis ([place]) 08/18/2012   Platelets 711,000  Hb 14, WBC 8,500 08/05/12  . Vitamin deficiency     Family History  Problem Relation Age  of Onset  . Heart failure Mother   . Other Mother        Mitral Insuffiency  . Coronary artery disease Maternal Grandfather   . Heart attack Maternal Grandfather   . Other Father        Brain Meningioma  . Diabetes Father   . Heart failure Father   . Heart failure Sister   . Heart failure Maternal Grandmother   . Heart disease Maternal Grandmother   . Other Paternal Grandmother        pnumonia  . Allergies Daughter   . Asthma Grandchild     Past Surgical History:  Procedure Laterality Date  . APPENDECTOMY  1942  . CARDIAC CATHETERIZATION  02/19/2010   EF55%, medically treated,   . CORONARY ARTERY BYPASS GRAFT  1996  .  INGUINAL HERNIA REPAIR     x2  . LAPAROSCOPIC CHOLECYSTECTOMY  1992  . left renal mass ablated  12/11  . NASAL CONCHA BULLOSA RESECTION    . NM MYOCAR PERF WALL MOTION  02/14/2010   post EF 71%, Exercise cap.7METS  . TONSILLECTOMY  1937  . TRANSTHORACIC ECHOCARDIOGRAM  12/10/2011   ZOXW>96%, stage1 diastolic dysfunction, mild to mod. LAE,   . TRANSTHORACIC ECHOCARDIOGRAM  07/25/2008   mild-moderate MR, mild-modTricuspid regurg.   Social History   Occupational History  . Not on file.   Social History Main Topics  . Smoking status: Never Smoker  . Smokeless tobacco: Never Used  . Alcohol use No  . Drug use: No  . Sexual activity: Not on file     Garald Balding, MD   Note - This record has been created using [employer].  Chart creation errors have been sought, but may not always  have been located. Such creation errors do not reflect on  the standard of medical care.

## 2017-02-17 ENCOUNTER — Ambulatory Visit
Admission: RE | Admit: 2017-02-17 | Discharge: 2017-02-17 | Disposition: A | Discharge status: Home / Self Care | Payer: Medicare Other | Source: Ambulatory Visit | Attending: Neurology | Admitting: Neurology

## 2017-02-17 DIAGNOSIS — R2689 Other abnormalities of gait and mobility: Secondary | ICD-10-CM

## 2017-02-17 DIAGNOSIS — R269 Unspecified abnormalities of gait and mobility: Secondary | ICD-10-CM | POA: Diagnosis not present

## 2017-02-18 ENCOUNTER — Telehealth: Payer: Self-pay

## 2017-02-18 NOTE — Telephone Encounter (Signed)
-----   Message from Star Age, MD sent at 02/18/2017  7:39 AM EDT ----- Please call patient regarding the recent brain MRI: The brain scan showed a normal structure of the brain but some volume loss which we call atrophy. There were changes in the deeper structures of the brain, which we call white matter changes or microvascular changes. These were reported as mild in His case. These are tiny white spots, that occur with time and are seen in a variety of conditions, including with normal aging, chronic hypertension, chronic headaches, especially migraine HAs, chronic diabetes, chronic hyperlipidemia. These are not strokes and no mass or lesion were seen which is reassuring.  Overall, mild progression in the atrophy compared to study from 04/2013, but this is to be expected.  Again, there were no acute findings, such as a stroke, or mass or blood products. No further action is required on this test at this time, other than re-enforcing the importance of good blood pressure control, good cholesterol control, good blood sugar control, and weight management. The patient can FU with PCP, as discussed. Thanks,  Star Age, MD, PhD

## 2017-02-18 NOTE — Telephone Encounter (Signed)
I called pt. I advised him that his MRI brain showed a normal structure of his brain, called atrophy, and mild white matter changes. Compared to the sleep study in 2014, the atrophy has progressed mildly. I reinforced good BP, good blood sugar, good cholesterol, and weight management. Pt can follow up with [name]. Pt asked that I mail him a copy of his MRI and that I send a copy to [name]. I verified with the pt that that the address we have on file is correct. Pt verbalized understanding of results. Pt had no questions at this time but was encouraged to call back if questions arise.

## 2017-02-18 NOTE — Progress Notes (Signed)
Please call patient regarding the recent brain MRI: The brain scan showed a normal structure of the brain but some volume loss which we call atrophy. There were changes in the deeper structures of the brain, which we call white matter changes or microvascular changes. These were reported as mild in His case. These are tiny white spots, that occur with time and are seen in a variety of conditions, including with normal aging, chronic hypertension, chronic headaches, especially migraine HAs, chronic diabetes, chronic hyperlipidemia. These are not strokes and no mass or lesion were seen which is reassuring.  Overall, mild progression in the atrophy compared to study from 04/2013, but this is to be expected.  Again, there were no acute findings, such as a stroke, or mass or blood products. No further action is required on this test at this time, other than re-enforcing the importance of good blood pressure control, good cholesterol control, good blood sugar control, and weight management. The patient can FU with PCP, as discussed. Thanks,  Star Age, MD, PhD

## 2017-02-19 DIAGNOSIS — H35361 Drusen (degenerative) of macula, right eye: Secondary | ICD-10-CM | POA: Diagnosis not present

## 2017-02-19 DIAGNOSIS — H353231 Exudative age-related macular degeneration, bilateral, with active choroidal neovascularization: Secondary | ICD-10-CM | POA: Diagnosis not present

## 2017-02-19 DIAGNOSIS — H353221 Exudative age-related macular degeneration, left eye, with active choroidal neovascularization: Secondary | ICD-10-CM | POA: Diagnosis not present

## 2017-03-11 ENCOUNTER — Other Ambulatory Visit: Payer: Self-pay | Admitting: Cardiovascular Disease

## 2017-03-12 DIAGNOSIS — Z23 Encounter for immunization: Secondary | ICD-10-CM | POA: Diagnosis not present

## 2017-03-18 ENCOUNTER — Other Ambulatory Visit: Payer: Self-pay | Admitting: *Deleted

## 2017-03-18 ENCOUNTER — Other Ambulatory Visit (INDEPENDENT_AMBULATORY_CARE_PROVIDER_SITE_OTHER): Payer: Medicare Other

## 2017-03-18 DIAGNOSIS — D473 Essential (hemorrhagic) thrombocythemia: Secondary | ICD-10-CM

## 2017-03-19 LAB — CMP14 + ANION GAP
A/G RATIO: 1.7 (ref 1.2–2.2)
ALK PHOS: 53 IU/L (ref 39–117)
ALT: 18 IU/L (ref 0–44)
AST: 26 IU/L (ref 0–40)
Albumin: 4.2 g/dL (ref 3.2–4.6)
Anion Gap: 13 mmol/L (ref 10.0–18.0)
BUN/Creatinine Ratio: 20 (ref 10–24)
BUN: 37 mg/dL — ABNORMAL HIGH (ref 10–36)
Bilirubin Total: 0.4 mg/dL (ref 0.0–1.2)
CO2: 20 mmol/L (ref 20–29)
Calcium: 9 mg/dL (ref 8.6–10.2)
Chloride: 106 mmol/L (ref 96–106)
Creatinine, Ser: 1.81 mg/dL — ABNORMAL HIGH (ref 0.76–1.27)
GFR calc Af Amer: 36 mL/min/{1.73_m2} — ABNORMAL LOW (ref >59)
GFR calc non Af Amer: 32 mL/min/{1.73_m2} — ABNORMAL LOW (ref >59)
Globulin, Total: 2.5 g/dL (ref 1.5–4.5)
Glucose: 78 mg/dL (ref 65–99)
POTASSIUM: 5.2 mmol/L (ref 3.5–5.2)
SODIUM: 139 mmol/L (ref 134–144)
Total Protein: 6.7 g/dL (ref 6.0–8.5)

## 2017-03-19 LAB — CBC WITH DIFFERENTIAL/PLATELET
BASOS ABS: 0.1 10*3/uL (ref 0.0–0.2)
Basos: 1 %
EOS (ABSOLUTE): 0.1 10*3/uL (ref 0.0–0.4)
Eos: 2 %
HEMOGLOBIN: 12 g/dL — AB (ref 13.0–17.7)
Hematocrit: 36 % — ABNORMAL LOW (ref 37.5–51.0)
IMMATURE GRANS (ABS): 0 10*3/uL (ref 0.0–0.1)
IMMATURE GRANULOCYTES: 0 %
LYMPHS: 28 %
Lymphocytes Absolute: 1.6 10*3/uL (ref 0.7–3.1)
MCH: 38.6 pg — AB (ref 26.6–33.0)
MCHC: 33.3 g/dL (ref 31.5–35.7)
MCV: 116 fL — AB (ref 79–97)
MONOCYTES: 11 %
Monocytes Absolute: 0.6 10*3/uL (ref 0.1–0.9)
NEUTROS ABS: 3.2 10*3/uL (ref 1.4–7.0)
NEUTROS PCT: 58 %
PLATELETS: 244 10*3/uL (ref 150–379)
RBC: 3.11 x10E6/uL — AB (ref 4.14–5.80)
RDW: 14.3 % (ref 12.3–15.4)
WBC: 5.6 10*3/uL (ref 3.4–10.8)

## 2017-03-19 LAB — LACTATE DEHYDROGENASE: LDH: 191 IU/L (ref 121–224)

## 2017-03-20 ENCOUNTER — Telehealth: Payer: Self-pay | Admitting: Cardiovascular Disease

## 2017-03-20 NOTE — Telephone Encounter (Signed)
Returned call to pt he states that he will call [facility] and have then change his Ambien rx pt states that he needed an RX for #90 and this was not sent. He will try to fix with the pharmacy

## 2017-03-20 NOTE — Telephone Encounter (Signed)
New messages    Wants Dr. Claiborne Billings to called him - did not disclose information

## 2017-03-23 DIAGNOSIS — L570 Actinic keratosis: Secondary | ICD-10-CM | POA: Diagnosis not present

## 2017-03-23 DIAGNOSIS — L821 Other seborrheic keratosis: Secondary | ICD-10-CM | POA: Diagnosis not present

## 2017-03-23 NOTE — Telephone Encounter (Signed)
Ok; should have # 30 with refills

## 2017-03-30 DIAGNOSIS — N183 Chronic kidney disease, stage 3 (moderate): Secondary | ICD-10-CM | POA: Diagnosis not present

## 2017-03-30 DIAGNOSIS — I129 Hypertensive chronic kidney disease with stage 1 through stage 4 chronic kidney disease, or unspecified chronic kidney disease: Secondary | ICD-10-CM | POA: Diagnosis not present

## 2017-04-01 ENCOUNTER — Ambulatory Visit (INDEPENDENT_AMBULATORY_CARE_PROVIDER_SITE_OTHER): Payer: Medicare Other | Admitting: Podiatry

## 2017-04-01 ENCOUNTER — Encounter: Payer: Self-pay | Admitting: Podiatry

## 2017-04-01 DIAGNOSIS — M79676 Pain in unspecified toe(s): Secondary | ICD-10-CM

## 2017-04-01 DIAGNOSIS — B351 Tinea unguium: Secondary | ICD-10-CM

## 2017-04-01 NOTE — Progress Notes (Addendum)
Patient ID: Alexander [name], Dr., male   DOB: [birth year], 83 y.o.   MRN: [id number] Complaint:  Visit Type: Patient returns to my office for continued preventative foot care services. Complaint: Patient states" my nails have grown long and thick and become painful to walk and wear shoes" . He presents for preventative foot care services. No changes to ROS  Podiatric Exam: Vascular: dorsalis pedis and posterior tibial pulses are palpable bilateral. Capillary return is immediate. Temperature gradient is WNL. Skin turgor WNL  Sensorium: Normal Semmes Weinstein monofilament test. Normal tactile sensation bilaterally. Nail Exam: Pt has thick disfigured discolored nails with subungual debris noted bilateral entire nail hallux through fifth toenails Ulcer Exam: There is no evidence of ulcer or pre-ulcerative changes or infection. Orthopedic Exam: Muscle tone and strength are WNL. No limitations in general ROM. No crepitus or effusions noted. Foot type and digits show no abnormalities. Bony prominences are unremarkable. Skin: No Porokeratosis. No infection or ulcers  Diagnosis:  Tinea unguium, Pain in right toe, pain in left toes  Treatment & Plan Procedures and Treatment: Consent by patient was obtained for treatment procedures. The patient understood the discussion of treatment and procedures well. All questions were answered thoroughly reviewed. Debridement of mycotic and hypertrophic toenails, 1 through 5 bilateral and clearing of subungual debris. No ulceration, no infection noted. ABN signed for 2018. Return Visit-Office Procedure: Patient instructed to return to the office for a follow up visit 9 weeks  for continued evaluation and treatment.   Gardiner Barefoot DPM

## 2017-04-09 DIAGNOSIS — H353231 Exudative age-related macular degeneration, bilateral, with active choroidal neovascularization: Secondary | ICD-10-CM | POA: Diagnosis not present

## 2017-04-09 DIAGNOSIS — H353211 Exudative age-related macular degeneration, right eye, with active choroidal neovascularization: Secondary | ICD-10-CM | POA: Diagnosis not present

## 2017-04-15 ENCOUNTER — Other Ambulatory Visit: Payer: Self-pay

## 2017-04-15 DIAGNOSIS — M810 Age-related osteoporosis without current pathological fracture: Secondary | ICD-10-CM | POA: Diagnosis not present

## 2017-04-15 DIAGNOSIS — I2510 Atherosclerotic heart disease of native coronary artery without angina pectoris: Secondary | ICD-10-CM | POA: Diagnosis not present

## 2017-04-15 DIAGNOSIS — Z6820 Body mass index (BMI) 20.0-20.9, adult: Secondary | ICD-10-CM | POA: Diagnosis not present

## 2017-04-15 DIAGNOSIS — R2689 Other abnormalities of gait and mobility: Secondary | ICD-10-CM | POA: Diagnosis not present

## 2017-04-15 DIAGNOSIS — D473 Essential (hemorrhagic) thrombocythemia: Secondary | ICD-10-CM | POA: Diagnosis not present

## 2017-04-15 DIAGNOSIS — E038 Other specified hypothyroidism: Secondary | ICD-10-CM | POA: Diagnosis not present

## 2017-04-15 DIAGNOSIS — N183 Chronic kidney disease, stage 3 (moderate): Secondary | ICD-10-CM | POA: Diagnosis not present

## 2017-04-15 MED ORDER — LOSARTAN POTASSIUM 25 MG PO TABS: 25.0000 mg | ORAL_TABLET | Freq: Every day | ORAL | 0 refills | Status: DC

## 2017-04-22 ENCOUNTER — Telehealth: Payer: Self-pay | Admitting: Cardiovascular Disease

## 2017-04-22 NOTE — Telephone Encounter (Signed)
Spoke with pharmacy, the patient is asking for plain ambien because the CR is too expensive. Okay given for the change.

## 2017-05-14 DIAGNOSIS — H353231 Exudative age-related macular degeneration, bilateral, with active choroidal neovascularization: Secondary | ICD-10-CM | POA: Diagnosis not present

## 2017-05-14 DIAGNOSIS — H04123 Dry eye syndrome of bilateral lacrimal glands: Secondary | ICD-10-CM | POA: Diagnosis not present

## 2017-05-14 DIAGNOSIS — H26492 Other secondary cataract, left eye: Secondary | ICD-10-CM | POA: Diagnosis not present

## 2017-05-14 DIAGNOSIS — Z961 Presence of intraocular lens: Secondary | ICD-10-CM | POA: Diagnosis not present

## 2017-05-27 DIAGNOSIS — R197 Diarrhea, unspecified: Secondary | ICD-10-CM | POA: Diagnosis not present

## 2017-06-03 ENCOUNTER — Ambulatory Visit (INDEPENDENT_AMBULATORY_CARE_PROVIDER_SITE_OTHER): Payer: Medicare Other | Admitting: Podiatry

## 2017-06-03 ENCOUNTER — Encounter: Payer: Self-pay | Admitting: Podiatry

## 2017-06-03 DIAGNOSIS — B351 Tinea unguium: Secondary | ICD-10-CM | POA: Diagnosis not present

## 2017-06-03 DIAGNOSIS — M79674 Pain in right toe(s): Secondary | ICD-10-CM

## 2017-06-03 DIAGNOSIS — M79676 Pain in unspecified toe(s): Secondary | ICD-10-CM

## 2017-06-03 NOTE — Progress Notes (Addendum)
Patient ID: Alexander [name], Dr., male   DOB: May 23, 1921, 84 y.o.   MRN: [id number] Complaint:  Visit Type: Patient returns to my office for continued preventative foot care services. Complaint: Patient states" my nails have grown long and thick and become painful to walk and wear shoes" . He presents for preventative foot care services. No changes to ROS  Podiatric Exam: Vascular: dorsalis pedis and posterior tibial pulses are palpable bilateral. Capillary return is immediate. Temperature gradient is WNL. Skin turgor WNL  Sensorium: Normal Semmes Weinstein monofilament test. Normal tactile sensation bilaterally. Nail Exam: Pt has thick disfigured discolored nails with subungual debris noted bilateral entire nail hallux through fifth toenails Ulcer Exam: There is no evidence of ulcer or pre-ulcerative changes or infection. Orthopedic Exam: Muscle tone and strength are WNL. No limitations in general ROM. No crepitus or effusions noted. Foot type and digits show no abnormalities. Bony prominences are unremarkable. Skin: No Porokeratosis. No infection or ulcers  Diagnosis:  Tinea unguium, Pain in right toe, pain in left toes  Treatment & Plan Procedures and Treatment: Consent by patient was obtained for treatment procedures. The patient understood the discussion of treatment and procedures well. All questions were answered thoroughly reviewed. Debridement of mycotic and hypertrophic toenails, 1 through 5 bilateral and clearing of subungual debris. No ulceration, no infection noted. ABN signed for 2019. Return Visit-Office Procedure: Patient instructed to return to the office for a follow up visit 9 weeks  for continued evaluation and treatment.   Gardiner Barefoot DPM

## 2017-06-10 ENCOUNTER — Other Ambulatory Visit (INDEPENDENT_AMBULATORY_CARE_PROVIDER_SITE_OTHER): Payer: Medicare Other

## 2017-06-10 DIAGNOSIS — D473 Essential (hemorrhagic) thrombocythemia: Secondary | ICD-10-CM | POA: Diagnosis not present

## 2017-06-11 ENCOUNTER — Telehealth: Payer: Self-pay | Admitting: *Deleted

## 2017-06-11 LAB — CBC WITH DIFFERENTIAL/PLATELET
BASOS ABS: 0.1 10*3/uL (ref 0.0–0.2)
Basos: 2 %
EOS (ABSOLUTE): 0.2 10*3/uL (ref 0.0–0.4)
Eos: 4 %
Hematocrit: 36.5 % — ABNORMAL LOW (ref 37.5–51.0)
Hemoglobin: 12.6 g/dL — ABNORMAL LOW (ref 13.0–17.7)
IMMATURE GRANS (ABS): 0 10*3/uL (ref 0.0–0.1)
IMMATURE GRANULOCYTES: 0 %
LYMPHS: 33 %
Lymphocytes Absolute: 1.7 10*3/uL (ref 0.7–3.1)
MCH: 40.3 pg — AB (ref 26.6–33.0)
MCHC: 34.5 g/dL (ref 31.5–35.7)
MCV: 117 fL — ABNORMAL HIGH (ref 79–97)
MONOS ABS: 0.6 10*3/uL (ref 0.1–0.9)
Monocytes: 11 %
Neutrophils Absolute: 2.6 10*3/uL (ref 1.4–7.0)
Neutrophils: 50 %
PLATELETS: 245 10*3/uL (ref 150–379)
RBC: 3.13 x10E6/uL — ABNORMAL LOW (ref 4.14–5.80)
RDW: 14.5 % (ref 12.3–15.4)
WBC: 5.1 10*3/uL (ref 3.4–10.8)

## 2017-06-11 NOTE — Telephone Encounter (Signed)
Pt called / informed "counts remain excellent. Continue current dose of Hydrea" per Dr [name]. I will send a message to Tamela Oddi S to schedule him an appt.

## 2017-06-25 DIAGNOSIS — H353231 Exudative age-related macular degeneration, bilateral, with active choroidal neovascularization: Secondary | ICD-10-CM | POA: Diagnosis not present

## 2017-06-25 DIAGNOSIS — H35363 Drusen (degenerative) of macula, bilateral: Secondary | ICD-10-CM | POA: Diagnosis not present

## 2017-06-25 DIAGNOSIS — H353211 Exudative age-related macular degeneration, right eye, with active choroidal neovascularization: Secondary | ICD-10-CM | POA: Diagnosis not present

## 2017-06-30 ENCOUNTER — Encounter: Payer: Medicare Other | Admitting: Oncology

## 2017-07-07 ENCOUNTER — Encounter (HOSPITAL_COMMUNITY)
Admission: RE | Admit: 2017-07-07 | Discharge: 2017-07-07 | Disposition: A | Discharge status: Home / Self Care | Payer: Medicare Other | Source: Ambulatory Visit | Attending: Internal Medicine | Admitting: Internal Medicine

## 2017-07-07 DIAGNOSIS — M810 Age-related osteoporosis without current pathological fracture: Secondary | ICD-10-CM | POA: Insufficient documentation

## 2017-07-07 MED ORDER — DENOSUMAB 60 MG/ML ~~LOC~~ SOLN
60.0000 mg | Freq: Once | SUBCUTANEOUS | Status: AC
  Administered 2017-07-07: 60 mg via SUBCUTANEOUS
  Filled 2017-07-07: qty 1

## 2017-07-07 NOTE — Discharge Instructions (Signed)
Denosumab injection /prolia °What is this medicine? °DENOSUMAB (den oh sue mab) slows bone breakdown. Prolia is used to treat osteoporosis in women after menopause and in men. Xgeva is used to treat a high calcium level due to cancer and to prevent bone fractures and other bone problems caused by multiple myeloma or cancer bone metastases. Xgeva is also used to treat giant cell tumor of the bone. °This medicine may be used for other purposes; ask your health care provider or pharmacist if you have questions. °COMMON BRAND NAME(S): Prolia, XGEVA °What should I tell my health care provider before I take this medicine? °They need to know if you have any of these conditions: °-dental disease °-having surgery or tooth extraction °-infection °-kidney disease °-low levels of calcium or Vitamin D in the blood °-malnutrition °-on hemodialysis °-skin conditions or sensitivity °-thyroid or parathyroid disease °-an unusual reaction to denosumab, other medicines, foods, dyes, or preservatives °-pregnant or trying to get pregnant °-breast-feeding °How should I use this medicine? °This medicine is for injection under the skin. It is given by a health care professional in a hospital or clinic setting. °If you are getting Prolia, a special MedGuide will be given to you by the pharmacist with each prescription and refill. Be sure to read this information carefully each time. °For Prolia, talk to your pediatrician regarding the use of this medicine in children. Special care may be needed. For Xgeva, talk to your pediatrician regarding the use of this medicine in children. While this drug may be prescribed for children as young as 13 years for selected conditions, precautions do apply. °Overdosage: If you think you have taken too much of this medicine contact a poison control center or emergency room at once. °NOTE: This medicine is only for you. Do not share this medicine with others. °What if I miss a dose? °It is important not to  miss your dose. Call your doctor or health care professional if you are unable to keep an appointment. °What may interact with this medicine? °Do not take this medicine with any of the following medications: °-other medicines containing denosumab °This medicine may also interact with the following medications: °-medicines that lower your chance of fighting infection °-steroid medicines like prednisone or cortisone °This list may not describe all possible interactions. Give your health care provider a list of all the medicines, herbs, non-prescription drugs, or dietary supplements you use. Also tell them if you smoke, drink alcohol, or use illegal drugs. Some items may interact with your medicine. °What should I watch for while using this medicine? °Visit your doctor or health care professional for regular checks on your progress. Your doctor or health care professional may order blood tests and other tests to see how you are doing. °Call your doctor or health care professional for advice if you get a fever, chills or sore throat, or other symptoms of a cold or flu. Do not treat yourself. This drug may decrease your body's ability to fight infection. Try to avoid being around people who are sick. °You should make sure you get enough calcium and vitamin D while you are taking this medicine, unless your doctor tells you not to. Discuss the foods you eat and the vitamins you take with your health care professional. °See your dentist regularly. Brush and floss your teeth as directed. Before you have any dental work done, tell your dentist you are receiving this medicine. °Do not become pregnant while taking this medicine or for 5 months after   after stopping it. Talk with your doctor or health care professional about your birth control options while taking this medicine. Women should inform their doctor if they wish to become pregnant or think they might be pregnant. There is a potential for serious side effects to an unborn  child. Talk to your health care professional or pharmacist for more information. What side effects may I notice from receiving this medicine? Side effects that you should report to your doctor or health care professional as soon as possible: -allergic reactions like skin rash, itching or hives, swelling of the face, lips, or tongue -bone pain -breathing problems -dizziness -jaw pain, especially after dental work -redness, blistering, peeling of the skin -signs and symptoms of infection like fever or chills; cough; sore throat; pain or trouble passing urine -signs of low calcium like fast heartbeat, muscle cramps or muscle pain; pain, tingling, numbness in the hands or feet; seizures -unusual bleeding or bruising -unusually weak or tired Side effects that usually do not require medical attention (report to your doctor or health care professional if they continue or are bothersome): -constipation -diarrhea -headache -joint pain -loss of appetite -muscle pain -runny nose -tiredness -upset stomach This list may not describe all possible side effects. Call your doctor for medical advice about side effects. You may report side effects to FDA at 1-800-FDA-1088. Where should I keep my medicine? This medicine is only given in a clinic, doctor's office, or other health care setting and will not be stored at home. NOTE: This sheet is a summary. It may not cover all possible information. If you have questions about this medicine, talk to your doctor, pharmacist, or health care provider.  2018 Elsevier/Gold Standard (2016-05-13 19:17:21)

## 2017-07-20 DIAGNOSIS — L820 Inflamed seborrheic keratosis: Secondary | ICD-10-CM | POA: Diagnosis not present

## 2017-07-20 DIAGNOSIS — L821 Other seborrheic keratosis: Secondary | ICD-10-CM | POA: Diagnosis not present

## 2017-07-27 ENCOUNTER — Ambulatory Visit (INDEPENDENT_AMBULATORY_CARE_PROVIDER_SITE_OTHER): Payer: Medicare Other | Admitting: Oncology

## 2017-07-27 ENCOUNTER — Other Ambulatory Visit: Payer: Self-pay | Admitting: Cardiovascular Disease

## 2017-07-27 ENCOUNTER — Other Ambulatory Visit: Payer: Self-pay

## 2017-07-27 ENCOUNTER — Encounter: Payer: Medicare Other | Admitting: Oncology

## 2017-07-27 ENCOUNTER — Encounter: Payer: Self-pay | Admitting: Oncology

## 2017-07-27 VITALS — BP 122/55 | HR 66 | Temp 97.2°F | Wt 126.3 lb

## 2017-07-27 DIAGNOSIS — N183 Chronic kidney disease, stage 3 unspecified: Secondary | ICD-10-CM

## 2017-07-27 DIAGNOSIS — D473 Essential (hemorrhagic) thrombocythemia: Secondary | ICD-10-CM | POA: Diagnosis not present

## 2017-07-27 DIAGNOSIS — I2510 Atherosclerotic heart disease of native coronary artery without angina pectoris: Secondary | ICD-10-CM

## 2017-07-27 DIAGNOSIS — D631 Anemia in chronic kidney disease: Secondary | ICD-10-CM

## 2017-07-27 DIAGNOSIS — J301 Allergic rhinitis due to pollen: Secondary | ICD-10-CM

## 2017-07-27 DIAGNOSIS — Z951 Presence of aortocoronary bypass graft: Secondary | ICD-10-CM | POA: Diagnosis not present

## 2017-07-27 DIAGNOSIS — Z79899 Other long term (current) drug therapy: Secondary | ICD-10-CM

## 2017-07-27 DIAGNOSIS — Z7982 Long term (current) use of aspirin: Secondary | ICD-10-CM

## 2017-07-27 NOTE — Progress Notes (Signed)
Hematology and Oncology Follow Up Visit  Alexander [name], Dr. [id number] Sep 11, 1921 84 y.o. 07/27/2017 10:44 AM   Principle Diagnosis: Encounter Diagnoses  Name Primary?  . Essential thrombocythemia ([place]) Yes  . 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 Platelet count running in the 7-800,000 range. He had no prior history of any thrombotic or neurologic events. I elected to start him on low-dose aspirin and observation. However, at the time of a 01/23/2014 visit, he reported  an acute change in hearing in his left ear. His ear nose and throat surgeon felt that this might represent a thrombotic event to a small vessel to his eighth nerve. I elected to start him on platelet lowering therapy with hydroxyurea at that time. Initial dose 500 mg daily. Dose initially increased but ultimately stabilized at just 500 mg daily with excellent control of his counts.     Interim History: He continues to do well.  He has now been on Hydrea since September 2015 with excellent control of his platelet count with stable hemoglobin and white count.  He can ascribe no particular side effects to the drug.  He has noted increasing fatigue which may be independent of the drug effect since this is not a common complaint. He denies any neurologic symptoms and specifically denies any headache, new change in vision (he has macular degeneration), no dysarthria, no focal weakness or paresthesias.  No chest pain or palpitations.  No change in bowel habit.  Medications: reviewed  Allergies:  Allergies  Allergen Reactions  . Pollen Extract     ragweed    Review of Systems: See interim history Remaining ROS negative:   Physical Exam: Blood pressure (!) 122/55, pulse 66, temperature  (!) 97.2 F (36.2 C), temperature source Oral, weight 126 lb 4.8 oz (57.3 kg), SpO2 96 %. Wt Readings from Last 3 Encounters:  07/27/17 126 lb 4.8 oz (57.3 kg)  02/02/17 120 lb (54.4 kg)  01/27/17 126 lb (57.2 kg)     General appearance: Frail elderly Caucasian man HENNT: Pharynx no erythema, exudate, mass, or ulcer. No thyromegaly or thyroid nodules Lymph nodes: No cervical, supraclavicular, or axillary lymphadenopathy Breasts:  Lungs: Clear to auscultation, resonant to percussion throughout Heart: Regular rhythm, no murmur, no gallop, no rub, no click, no edema Abdomen: Soft, nontender, normal bowel sounds, no mass, no organomegaly Extremities: No edema, no calf tenderness Musculoskeletal: no joint deformities GU:  Vascular: Carotid pulses 2+, no bruits, symmetric Neurologic: Alert, oriented, PERRLA, optic discs sharp and vessels normal, no hemorrhage or exudate, cranial nerves grossly normal, motor strength 5 over 5, reflexes 1+ symmetric, upper body coordination normal, gait normal, Skin: No rash or ecchymosis  Lab Results: CBC W/Diff    Component Value Date/Time   WBC 5.1 06/10/2017 1044   WBC 5.1 11/22/2014 0906   RBC 3.13 (L) 06/10/2017 1044   RBC 3.28 (L) 11/22/2014 0906   HGB 12.6 (L) 06/10/2017 1044   HGB 12.7 (L) 03/18/2013 1437   HCT 36.5 (L) 06/10/2017 1044   HCT 39.3 03/18/2013 1437   PLT 245 06/10/2017 1044   MCV 117 (H) 06/10/2017 1044   MCV 95.4 03/18/2013 1437   MCH 40.3 (H) 06/10/2017 1044   MCH 38.1 (H) 11/22/2014 0906   MCHC 34.5 06/10/2017 1044  MCHC 33.8 11/22/2014 0906   RDW 14.5 06/10/2017 1044   RDW 15.0 (H) 03/18/2013 1437   LYMPHSABS 1.7 06/10/2017 1044   LYMPHSABS 2.3 03/18/2013 1437   MONOABS 0.6 11/22/2014 0906   MONOABS 0.7 03/18/2013 1437   EOSABS 0.2 06/10/2017 1044   BASOSABS 0.1 06/10/2017 1044   BASOSABS 0.3 (H) 03/18/2013 1437     Chemistry      Component Value Date/Time   NA 139 03/18/2017 1345   NA 142 03/18/2013 1437    K 5.2 03/18/2017 1345   K 4.5 03/18/2013 1437   CL 106 03/18/2017 1345   CL 108 (H) 09/01/2012 0950   CO2 20 03/18/2017 1345   CO2 23 03/18/2013 1437   BUN 37 (H) 03/18/2017 1345   BUN 28.2 (H) 03/18/2013 1437   CREATININE 1.81 (H) 03/18/2017 1345   CREATININE 1.74 (H) 05/01/2014 0955   CREATININE 1.6 (H) 03/18/2013 1437      Component Value Date/Time   CALCIUM 9.0 03/18/2017 1345   CALCIUM 8.9 08/07/2014 0939   CALCIUM 9.7 03/18/2013 1437   ALKPHOS 53 03/18/2017 1345   ALKPHOS 82 09/01/2012 0950   AST 26 03/18/2017 1345   AST 22 09/01/2012 0950   ALT 18 03/18/2017 1345   ALT 16 09/01/2012 0950   BILITOT 0.4 03/18/2017 1345   BILITOT 0.40 09/01/2012 0950       Radiological Studies: No results found.  Impression:  1. JAK-2 positive myeloproliferative disorder: essential thrombocythemia. Well-controlled on low-dose Hydrea and low-dose aspirin. Plan: Continue the same. Monitor blood counts every 2 months.  Clinical visits every 4 months.  2.  Stage III chronic renal insufficiency  3.  Mild anemia secondary to #2.  4.  History of coronary artery disease 20+ years post bypass surgery  CC: Patient Care Team: Crist Infante, MD as PCP - General (Internal Medicine) Elsie Stain, MD as Attending Physician (Pulmonary Disease) Elsie Stain, MD as Consulting Physician (Pulmonary Disease) Troy Sine, MD as Consulting Physician (Cardiology) Annia Belt, MD as Consulting Physician (Hematology and Oncology) Richmond Campbell, MD as Consulting Physician (Gastroenterology)   Murriel Hopper, MD, [place]  Hematology-Oncology/Internal Medicine     3/25/201910:44 AM

## 2017-07-27 NOTE — Patient Instructions (Signed)
To lab today Return visit 4 months Please get a CBC at your son's office in 2 months & send [place] the results

## 2017-07-28 ENCOUNTER — Other Ambulatory Visit: Payer: Self-pay | Admitting: Cardiovascular Disease

## 2017-07-28 ENCOUNTER — Telehealth: Payer: Self-pay | Admitting: *Deleted

## 2017-07-28 LAB — CBC WITH DIFFERENTIAL/PLATELET
BASOS: 1 %
Basophils Absolute: 0.1 10*3/uL (ref 0.0–0.2)
EOS (ABSOLUTE): 0.2 10*3/uL (ref 0.0–0.4)
EOS: 4 %
Hematocrit: 36.8 % — ABNORMAL LOW (ref 37.5–51.0)
Hemoglobin: 12 g/dL — ABNORMAL LOW (ref 13.0–17.7)
IMMATURE GRANS (ABS): 0 10*3/uL (ref 0.0–0.1)
IMMATURE GRANULOCYTES: 0 %
LYMPHS: 27 %
Lymphocytes Absolute: 1.3 10*3/uL (ref 0.7–3.1)
MCH: 38.6 pg — ABNORMAL HIGH (ref 26.6–33.0)
MCHC: 32.6 g/dL (ref 31.5–35.7)
MCV: 118 fL — AB (ref 79–97)
MONOCYTES: 15 %
Monocytes Absolute: 0.7 10*3/uL (ref 0.1–0.9)
NEUTROS PCT: 53 %
Neutrophils Absolute: 2.5 10*3/uL (ref 1.4–7.0)
PLATELETS: 254 10*3/uL (ref 150–379)
RBC: 3.11 x10E6/uL — ABNORMAL LOW (ref 4.14–5.80)
RDW: 14 % (ref 12.3–15.4)
WBC: 4.8 10*3/uL (ref 3.4–10.8)

## 2017-07-28 LAB — COMPREHENSIVE METABOLIC PANEL
ALT: 17 IU/L (ref 0–44)
AST: 22 IU/L (ref 0–40)
Albumin/Globulin Ratio: 1.6 (ref 1.2–2.2)
Albumin: 4.1 g/dL (ref 3.2–4.6)
Alkaline Phosphatase: 52 IU/L (ref 39–117)
BUN/Creatinine Ratio: 16 (ref 10–24)
BUN: 34 mg/dL (ref 10–36)
Bilirubin Total: 0.4 mg/dL (ref 0.0–1.2)
CALCIUM: 8.8 mg/dL (ref 8.6–10.2)
CO2: 20 mmol/L (ref 20–29)
CREATININE: 2.1 mg/dL — AB (ref 0.76–1.27)
Chloride: 108 mmol/L — ABNORMAL HIGH (ref 96–106)
GFR calc Af Amer: 30 mL/min/{1.73_m2} — ABNORMAL LOW (ref >59)
GFR, EST NON AFRICAN AMERICAN: 26 mL/min/{1.73_m2} — AB (ref >59)
Globulin, Total: 2.5 g/dL (ref 1.5–4.5)
Glucose: 67 mg/dL (ref 65–99)
POTASSIUM: 4.4 mmol/L (ref 3.5–5.2)
Sodium: 143 mmol/L (ref 134–144)
Total Protein: 6.6 g/dL (ref 6.0–8.5)

## 2017-07-28 LAB — URIC ACID: URIC ACID: 5.9 mg/dL (ref 3.7–8.6)

## 2017-07-28 LAB — LACTATE DEHYDROGENASE: LDH: 181 IU/L (ref 121–224)

## 2017-07-28 NOTE — Telephone Encounter (Signed)
-----   Message from Annia Belt, MD sent at 07/28/2017 11:50 AM EDT ----- Call pt: lab all stable compared with previous. Hb 12, platelets 254,000, creatinine 2.1

## 2017-07-28 NOTE — Telephone Encounter (Addendum)
Pt called / informed "lab all stable compared with previous. Hb 12, platelets 254,000, creatinine 2.1" per Dr [name].  Stated he's concern about elevated Creat level 2.1. From 1.8; wants to know if this will affect his condition in any way? Thanks

## 2017-07-28 NOTE — Telephone Encounter (Signed)
Answer is no - this is a random fluctuation - creatinine has been at this level in the past.

## 2017-07-29 ENCOUNTER — Telehealth: Payer: Self-pay | Admitting: Cardiovascular Disease

## 2017-07-29 ENCOUNTER — Other Ambulatory Visit: Payer: Self-pay

## 2017-07-29 MED ORDER — ZOLPIDEM TARTRATE ER 6.25 MG PO TBCR: 6.2500 mg | EXTENDED_RELEASE_TABLET | Freq: Every evening | ORAL | 3 refills | Status: DC | PRN

## 2017-07-29 NOTE — Telephone Encounter (Signed)
Called pt - no answer; left message - elevated crea will not affect his condition "this is a random fluctuation - creatinine has been at this level in the past." per Dr [name]. And call for any questions.

## 2017-07-29 NOTE — Telephone Encounter (Signed)
New message      *STAT* If patient is at the pharmacy, call can be transferred to refill team.   1. Which medications need to be refilled? (please list name of each medication and dose if known)  zolpidem (AMBIEN CR) 6.25 MG CR tablet Take 1 tablet (6.25 mg total) by mouth at bedtime as needed for sleep. For sleep maintenance        2. Which pharmacy/location (including street and city if local pharmacy) is medication to be sent to [facility]   3. Do they need a 30 day or 90 day supply? 30 -  The prescription did not transmit - it says Printed

## 2017-07-30 ENCOUNTER — Other Ambulatory Visit: Payer: Self-pay | Admitting: Cardiovascular Disease

## 2017-07-30 NOTE — Telephone Encounter (Signed)
Okay to refill? 

## 2017-07-31 MED ORDER — ZOLPIDEM TARTRATE ER 6.25 MG PO TBCR: 6.2500 mg | EXTENDED_RELEASE_TABLET | Freq: Every evening | ORAL | 3 refills | Status: DC | PRN

## 2017-07-31 NOTE — Telephone Encounter (Signed)
Follow up    Pharmacy needs to confirm the prescription called in for the Ambien

## 2017-07-31 NOTE — Telephone Encounter (Signed)
Rx called in to pharmacy. 

## 2017-08-03 NOTE — Telephone Encounter (Signed)
Spoke to pharmacist.   Patient was switched to short acting ambien 04/2017-see phone note.  Verbal per Dr. Claiborne Billings to refill Lorrin Mais 5mg  Q 30 R3

## 2017-08-05 ENCOUNTER — Ambulatory Visit (INDEPENDENT_AMBULATORY_CARE_PROVIDER_SITE_OTHER): Payer: Medicare Other | Admitting: Cardiovascular Disease

## 2017-08-05 ENCOUNTER — Encounter: Payer: Self-pay | Admitting: Cardiovascular Disease

## 2017-08-05 VITALS — BP 132/74 | HR 56 | Ht 66.0 in | Wt 128.2 lb

## 2017-08-05 DIAGNOSIS — I351 Nonrheumatic aortic (valve) insufficiency: Secondary | ICD-10-CM | POA: Diagnosis not present

## 2017-08-05 DIAGNOSIS — G479 Sleep disorder, unspecified: Secondary | ICD-10-CM

## 2017-08-05 DIAGNOSIS — N184 Chronic kidney disease, stage 4 (severe): Secondary | ICD-10-CM | POA: Diagnosis not present

## 2017-08-05 DIAGNOSIS — Z79899 Other long term (current) drug therapy: Secondary | ICD-10-CM | POA: Diagnosis not present

## 2017-08-05 DIAGNOSIS — R0602 Shortness of breath: Secondary | ICD-10-CM | POA: Diagnosis not present

## 2017-08-05 DIAGNOSIS — I10 Essential (primary) hypertension: Secondary | ICD-10-CM

## 2017-08-05 DIAGNOSIS — I25700 Atherosclerosis of coronary artery bypass graft(s), unspecified, with unstable angina pectoris: Secondary | ICD-10-CM

## 2017-08-05 LAB — BASIC METABOLIC PANEL
BUN / CREAT RATIO: 18 (ref 10–24)
BUN: 31 mg/dL (ref 10–36)
CO2: 20 mmol/L (ref 20–29)
Calcium: 8.9 mg/dL (ref 8.6–10.2)
Chloride: 109 mmol/L — ABNORMAL HIGH (ref 96–106)
Creatinine, Ser: 1.72 mg/dL — ABNORMAL HIGH (ref 0.76–1.27)
GFR calc Af Amer: 39 mL/min/{1.73_m2} — ABNORMAL LOW (ref >59)
GFR calc non Af Amer: 34 mL/min/{1.73_m2} — ABNORMAL LOW (ref >59)
GLUCOSE: 88 mg/dL (ref 65–99)
POTASSIUM: 4.8 mmol/L (ref 3.5–5.2)
SODIUM: 141 mmol/L (ref 134–144)

## 2017-08-05 MED ORDER — ESZOPICLONE 2 MG PO TABS: 1.0000 mg | ORAL_TABLET | Freq: Every evening | ORAL | 3 refills | Status: DC | PRN

## 2017-08-05 MED ORDER — HYDRALAZINE HCL 25 MG PO TABS: 12.5000 mg | ORAL_TABLET | Freq: Two times a day (BID) | ORAL | 3 refills | Status: DC

## 2017-08-05 MED ORDER — ISOSORBIDE MONONITRATE ER 30 MG PO TB24: 15.0000 mg | ORAL_TABLET | Freq: Every day | ORAL | 3 refills | Status: DC

## 2017-08-05 MED ORDER — ESZOPICLONE 2 MG PO TABS: 2.0000 mg | ORAL_TABLET | Freq: Every evening | ORAL | 3 refills | Status: DC | PRN

## 2017-08-05 NOTE — Progress Notes (Signed)
Patient brought in prescription for his vitamin B-12 and requested for it to be given.  Vitamin B-12 1000 mcg/ml. 1 ml given left deltoid IM.-W. Saverio Danker, [facility]

## 2017-08-05 NOTE — Patient Instructions (Signed)
Medication Instructions:  STOP Losartan  START isosorbide (Imdur) 30 mg ---take 1/2 tablet daily  START hydralazine 12.5 mg two times daily  Labwork: BMET-today  Testing/Procedures: Your physician has requested that you have an echocardiogram. Echocardiography is a painless test that uses sound waves to create images of your heart. It provides your doctor with information about the size and shape of your heart and how well your heart's chambers and valves are working. This procedure takes approximately one hour. There are no restrictions for this procedure.  This will be done at our [facility] location:  [address]  Follow-Up: 6-8 weeks with Dr. Claiborne Billings  Any Other Special Instructions Will Be Listed Below (If Applicable).     If you need a refill on your cardiac medications before your next appointment, please call your pharmacy.

## 2017-08-06 ENCOUNTER — Telehealth: Payer: Self-pay | Admitting: Cardiovascular Disease

## 2017-08-06 ENCOUNTER — Encounter: Payer: Self-pay | Admitting: Cardiovascular Disease

## 2017-08-06 NOTE — Telephone Encounter (Signed)
Follow up    Patient calling insisting on speaking with nurse. No details given. Please call.

## 2017-08-06 NOTE — Telephone Encounter (Signed)
New message  Pt verbalized that he is calling for RN  Please call its about the medication that he was asked to take  Please call after 2pm    Pt did not want to give me any more information about the medication

## 2017-08-06 NOTE — Progress Notes (Signed)
Patient ID: Alexander [name], Dr., male   DOB: 02-20-1922, 84 y.o.   MRN: [id number]     Primary M.D.: [name]  HPI: [name] is a 84 y.o. male who presents to the office for a 7 month followup cardiologic evaluation.  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  In 1996 while living in [place] he underwent CABG surgery and had a LIMA placed to the LAD, vein to the marginal, vein to the ramus intermediate, and vein to the PDA. In October 2011 catheterization by me showed severe CAD with 23 - 80% ostial left main stenosis, total occlusion of the LAD after diagonal, 95% ramus intermediate stenosis, total occlusion of the marginal branch of the circumflex coronary artery, and severe diffuse native RCA disease with total occlusion of the PDA at the ostium. A graft that supplied the marginal was occluded as was the vein graft that supplied the ramus intermediate vessel. He had patent vein graft supplying the PDA branch of his RCA and a patent LIMA to the LAD. He has been on medical therapy. I did try adding Ranexa to his medical regimen but he did not tolerate this. He does have mild renal insufficiency with creatinines in the 1.5-1.6 range which have been followed by [name] an now [name]. He has a hiatal hernia. He had been on Crestor as well as Vytorin in the past but admits to myalgias in the past on statins.  Recently, he had been on Zetia.  However, he has self discontinued the Zetia since he felt he was having some vague muscle aches.   He has renal insufficiency with creatinines in the 1.8 range.  He is now followed by [name] since [name] retired.  His last lipid panel in December 2015 showed a total cholesterol 133, triglycerides 133, LDL 64, HDL 42.    He is being followed by [name] for elevated platelets and these have now stabilized with the addition of hydroxyurea.  He was found to have a mutation in the JAK-2 gene consistent with a  diagnosis of essential thrombocythemia.   He underwent an echo Doppler study on 09/24/2015 which showed an EF of 60-65%.  There was grade 2 diastolic dysfunction and high ventricular filling pressure.  He had moderate to severely calcified aortic valve annulus with mildly calcified leaflets and mild aortic insufficiency.  He specifically denies any chest pain.  He is unaware of palpitations.  There is mild shortness of breath with activity.  Recent lipid studies were done on 03/20/2016 and showed a total cholesterol 164, HDL 31, LDL 83, VLDL 50, and triglycerides 251.  He has chronic kidney disease and is followed by nephrology.  Creatinine in August 2017 was 1.92.    He has been without chest pain.  He does note very mild shortness of breath with a walking or walking up steps.  A concern has been that of weight loss and he states that over the past year he has lost around 12-14 pounds.  He is followed by [name] and in February 2018 creatinine had risen to 2.09.  He tells me that this was recently rechecked and it had improved down to 1.9.  He admits to fatigue.  He continues to be on levothyroxine at 0.125 mg daily for hypothyroidism.  He is followed by hematology for his thrombocytosis and is on Hydrea.  When I last saw him, he was bradycardic and I  recommended reduction of his metoprolol dose down to 12.5 mg twice a day.  He has felt well with this adjustment.  He underwent an echo Doppler study on 10/07/2016, which continue to show normal systolic function with an EF of 55-60%.  There was restricted mobility of his noncoronary cusp as she is aortic valve with mild aortic insufficiency.  There was mild pulmonary hypertension with a PA pressure 37 mm.  There was grade 1 diastolic dysfunction.  His peak aortic gradient 6 mm.  Subsequent blood work in June showed further improvement in his creatinine to 1.80.  He has been having difficulty with sleep maintenance.  Typically he takes zolpidem 5 mg at  bedtime, but usually within 4 hours he is back awake and has had difficulty with sleep maintenance. He underwent surgery on his right toe due to ingrown toenail by [name] and tolerated surgery well including general anesthesia without cardiovascular compromise.    Since I last saw him, he admits to noticing some more shortness of breath with activity.  He denies chest tightness.  He had undergone recent laboratory [name].  His hemoglobin was 12, hematocrit 36.8, and MCV 118.  Serum creatinine had risen to 2.10.  He is unaware of palpitations.  He has not been able to routinely exercise.  Past Medical History:  Diagnosis Date  . Allergic rhinitis   . Anemia   . Anemia, chronic renal failure 10/10/2013  . Anemia, normocytic normochromic 10/10/2013  . BPH (benign prostatic hyperplasia)   . Calcium oxalate renal stones   . CKD (chronic kidney disease), stage III ([place])   . Diverticulitis   . Dry senile macular degeneration   . Essential thrombocythemia ([place]) 10/10/2013  . GERD (gastroesophageal reflux disease)   . Hearing loss in left ear   . Heart murmur   . Hyperlipidemia   . Hypertension   . Migraines    OCULAR  . Peripheral neuropathy   . Renal lesion   . Spinal stenosis   . Thrombocytosis ([place]) 08/18/2012   Platelets 711,000  Hb 14, WBC 8,500 08/05/12  . Vitamin deficiency     Past Surgical History:  Procedure Laterality Date  . APPENDECTOMY  1942  . CARDIAC CATHETERIZATION  02/19/2010   EF55%, medically treated,   . CORONARY ARTERY BYPASS GRAFT  1996  . INGUINAL HERNIA REPAIR     x2  . LAPAROSCOPIC CHOLECYSTECTOMY  1992  . left renal mass ablated  12/11  . NASAL CONCHA BULLOSA RESECTION    . NM MYOCAR PERF WALL MOTION  02/14/2010   post EF 71%, Exercise cap.7METS  . TONSILLECTOMY  1937  . TRANSTHORACIC ECHOCARDIOGRAM  12/10/2011   ZOXW>96%, stage1 diastolic dysfunction, mild to mod. LAE,   . TRANSTHORACIC ECHOCARDIOGRAM  07/25/2008   mild-moderate MR,  mild-modTricuspid regurg.    Allergies  Allergen Reactions  . Pollen Extract     ragweed    Current Outpatient Medications  Medication Sig Dispense Refill  . aluminum & magnesium hydroxide-simethicone (MYLANTA) 500-450-40 MG/5ML suspension Take by mouth every 6 (six) hours as needed. 200-200-74m/5ml     . aspirin 81 MG tablet Take 81 mg by mouth daily.      . calcium citrate (CALCITRATE - DOSED IN MG ELEMENTAL CALCIUM) 950 MG tablet Take 200 mg of elemental calcium by mouth every 30 (thirty) days.     . cholecalciferol (VITAMIN D) 1000 UNITS tablet Take 2,000 Units by mouth daily.     . Coenzyme Q10 (CO Q-10)  300 MG CAPS Take 1 capsule by mouth daily.    . Cyanocobalamin (VITAMIN B-12 IJ) Inject as directed. monthly     . denosumab (PROLIA) 60 MG/ML SOLN injection Inject 60 mg into the skin every 6 (six) months. Administer in upper arm, thigh, or abdomen    . ezetimibe (ZETIA) 10 MG tablet Take 10 mg by mouth daily.    . finasteride (PROSCAR) 5 MG tablet Take 1 tablet by mouth 3 (three) times a week. TIW    . hydroxyurea (HYDREA) 500 MG capsule TAKE 1 CAPSULE BY MOUTH  DAILY. MAY TAKE WITH FOOD  TO MINIMIZE GI SIDE  EFFECTS. 90 capsule 3  . levothyroxine (SYNTHROID, LEVOTHROID) 25 MCG tablet Take 25 mcg by mouth daily before breakfast.    . LUTEIN PO Take 6 mg by mouth daily.     . metoprolol tartrate (LOPRESSOR) 25 MG tablet Take 0.5 tablets (12.5 mg total) by mouth 2 (two) times daily. (Patient taking differently: Take 25 mg by mouth daily. Take 1 25 mg tablet daily) 30 tablet 6  . Multiple Vitamin (MULTIVITAMIN) tablet Take 1 tablet by mouth daily.      . Multiple Vitamins-Minerals (PRESERVISION AREDS PO) Take 1 capsule by mouth 2 (two) times daily.      . ranitidine (ZANTAC) 150 MG capsule Take 150 mg by mouth daily.    . eszopiclone (LUNESTA) 2 MG TABS tablet Take 0.5 tablets (1 mg total) by mouth at bedtime as needed for sleep (sleep maintenance). Take immediately before bedtime 15  tablet 3  . hydrALAZINE (APRESOLINE) 25 MG tablet Take 0.5 tablets (12.5 mg total) by mouth 2 (two) times daily. 90 tablet 3  . isosorbide mononitrate (IMDUR) 30 MG 24 hr tablet Take 0.5 tablets (15 mg total) by mouth daily. 45 tablet 3   No current facility-administered medications for this visit.     Social History   Socioeconomic History  . Marital status: Widowed    Spouse name: Not on file  . Number of children: 3  . Years of education: Not on file  . Highest education level: Not on file  Occupational History  . Not on file  Social Needs  . Financial resource strain: Not on file  . Food insecurity:    Worry: Not on file    Inability: Not on file  . Transportation needs:    Medical: Not on file    Non-medical: Not on file  Tobacco Use  . Smoking status: Never Smoker  . Smokeless tobacco: Never Used  Substance and Sexual Activity  . Alcohol use: No  . Drug use: No  . Sexual activity: Not on file  Lifestyle  . Physical activity:    Days per week: Not on file    Minutes per session: Not on file  . Stress: Not on file  Relationships  . Social connections:    Talks on phone: Not on file    Gets together: Not on file    Attends religious service: Not on file    Active member of club or organization: Not on file    Attends meetings of clubs or organizations: Not on file    Relationship status: Not on file  . Intimate partner violence:    Fear of current or ex partner: Not on file    Emotionally abused: Not on file    Physically abused: Not on file    Forced sexual activity: Not on file  Other Topics Concern  . Not on  file  Social History Narrative  . Not on file    Socially he is widowed. He is retired Sales promotion account executive.  He has 3 children and 9 grandchildren. There's no tobacco use.   ROS General: Negative; No fevers, chills, or night sweats;  HEENT: Negative; No changes in vision or hearing, sinus congestion, difficulty swallowing Pulmonary:  Negative; No cough, wheezing, shortness of breath, hemoptysis Cardiovascular:  See HPI; No chest pain, presyncope, syncope, palpitations GI: Positive for a large hiatal hernia; No nausea, vomiting, diarrhea, or abdominal pain GU: Negative; No dysuria, hematuria, or difficulty voiding Musculoskeletal: Negative; no myalgias, joint pain, or weakness Hematologic/Oncology: He is followed by [name] for thrombocytosis.; no easy bruising, bleeding Endocrine: Negative; no heat/cold intolerance; no diabetes Neuro: Negative; no changes in balance, headaches Skin: Negative; No rashes or skin lesions Psychiatric: Negative; No behavioral problems, depression Sleep: Negative; No snoring, daytime sleepiness, hypersomnolence, bruxism, restless legs, hypnogognic hallucinations, no cataplexy Other comprehensive 14 point system review is negative.   PE BP 132/74   Pulse (!) 56   Ht 5' 6" (1.676 m)   Wt 128 lb 3.2 oz (58.2 kg)   BMI 20.69 kg/m    Repeat blood pressure by me was 120/70 supine and 112/68 standing  Wt Readings from Last 3 Encounters:  08/05/17 128 lb 3.2 oz (58.2 kg)  07/27/17 126 lb 4.8 oz (57.3 kg)  02/02/17 120 lb (54.4 kg)   General: Alert, oriented, no distress.  Skin: normal turgor, no rashes, warm and dry HEENT: Normocephalic, atraumatic. Pupils equal round and reactive to light; sclera anicteric; extraocular muscles intact;  Nose without nasal septal hypertrophy Mouth/Parynx benign; Mallinpatti scale 3 Neck: No JVD, no carotid bruits; normal carotid upstroke Lungs: clear to ausculatation and percussion; no wheezing or rales Chest wall: without tenderness to palpitation Heart: PMI not displaced, RRR, s1 s2 normal, 2/6 systolic murmur, 1/6 aortic insufficiency diastolic murmur, no rubs, gallops, thrills, or heaves Abdomen: soft, nontender; no hepatosplenomehaly, BS+; abdominal aorta nontender and not dilated by palpation. Back: no CVA tenderness Pulses  2+ Musculoskeletal: full range of motion, normal strength, no joint deformities Extremities: no clubbing cyanosis or edema, Homan's sign negative  Neurologic: grossly nonfocal; Cranial nerves grossly wnl Psychologic: Normal mood and affect  ECG (independently read by me): Sinus bradycardia 56 bpm.  No ectopy.  Normal intervals  September 2018 ECG (independently read by me): Normal sinus rhythm at 64 bpm.  Probable left atrial dilatation.  Poor anterior R-wave progression.  QTc interval 410 ms.  May 2018 ECG (independently read by me): Sinus bradycardia at 48 bpm.  PR interval 194 ms.  QTc interval 416 ms.  No ST segment changes.  November 2017 ECG (independently read by me): Sinus bradycardia 51 bpm.  No ectopy.  Normal intervals.  April 2017 ECG (independently read by me): Sinus bradycardia 53 bpm.  Poor progression anteriorly.  October 2016 ECG (independently read by me): Sinus bradycardia 54 bpm.  Poor progression V1 through V3.  Normal intervals.  September 2015 ECG (independently read by me): Sinus bradycardia 52 beats per minute.  Normal intervals.  No ST segment changes.  06/08/2013 ECG: Sinus bradycardia 49 beats per minute. No ectopy. Normal intervals.  Prior ECG of 11/30/2012: Sinus rhythm at 53 beats per minute. Normal intervals.  LABS: BMP Latest Ref Rng & Units 08/05/2017 07/27/2017 03/18/2017  Glucose 65 - 99 mg/dL 88 67 78  BUN 10 - 36 mg/dL 31 34 37(H)  Creatinine 0.76 - 1.27 mg/dL 1.72(H)  2.10(H) 1.81(H)  BUN/Creat Ratio 10 - _0 Sodium 134 - 144 mmol/L 141 143 139  Potassium 3.5 - 5.2 mmol/L 4.8 4.4 5.2  Chloride 96 - 106 mmol/L 109(H) 108(H) 106  CO2 20 - 29 mmol/L _1 Calcium 8.6 - 10.2 mg/dL 8.9 8.8 9.0   Hepatic Function Latest Ref Rng & Units 07/27/2017 03/18/2017 10/14/2016  Total Protein 6.0 - 8.5 g/dL 6.6 6.7 6.5  Albumin 3.2 - 4.6 g/dL 4.1 4.2 4.1  AST 0 - 40 IU/L _2 ALT 0 - 44 IU/L _3 Alk Phosphatase 39 - 117 IU/L 52 53 53   Total Bilirubin 0.0 - 1.2 mg/dL 0.4 0.4 0.5   CBC Latest Ref Rng & Units 07/27/2017 06/10/2017 03/18/2017  WBC 3.4 - 10.8 x10E3/uL 4.8 5.1 5.6  Hemoglobin 13.0 - 17.7 g/dL 12.0(L) 12.6(L) 12.0(L)  Hematocrit 37.5 - 51.0 % 36.8(L) 36.5(L) 36.0(L)  Platelets 150 - 379 x10E3/uL 254 245 244   Lab Results  Component Value Date   MCV 118 (H) 07/27/2017   MCV 117 (H) 06/10/2017   MCV 116 (H) 03/18/2017   Lab Results  Component Value Date   TSH 4.049 05/01/2014  No results found for: HGBA1C   Lipid Panel     Component Value Date/Time   CHOL 164 03/20/2016 1109   CHOL 163 09/12/2015 1138   TRIG 251 (H) 03/20/2016 1109   HDL 31 (L) 03/20/2016 1109   HDL 33 (L) 09/12/2015 1138   CHOLHDL 5.3 (H) 03/20/2016 1109   VLDL 50 (H) 03/20/2016 1109   LDLCALC 83 03/20/2016 1109   LDLCALC 85 09/12/2015 1138    RADIOLOGY: No results found.   IMPRESSION:  1. Coronary artery disease involving coronary bypass graft of native heart with unstable angina pectoris ([place])   2. Shortness of breath   3. CKD (chronic kidney disease) stage 4, GFR 15-29 ml/min ([facility])   4. Medication management   5. Difficulty sleeping   6. Hypertension, unspecified type   7. 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 He has documented graft occlusion of 2 of his grafts and has severe native coronary artery disease. Remotely, we tried adding Ranexa to his medication but he has stopped this due to concerns of tolerability.  He is on a reduced dose of metoprolol due to previous documentation of bradycardia.  His echo Doppler study one year ago showed no systolic function with grade 1 diastolic dysfunction, aortic sclerosis with mild restriction of the noncoronary cusp and mild aortic insufficiency.  Recently he has noticed increasing shortness of  breath with activity.  This may be related to his underlying coronary artery disease as well as potential progressive aortic valve disease or diastolic dysfunction.  I am recommending a one-year follow-up echo Doppler study.  He recently was found to have increasing creatinine up to 2.1.  He has been on losartan 25 mg.  I have suggested discontinuance of losartan in light of his exertional dyspnea and will start him on low-dose isosorbide mononitrate at 15 mg in addition to hydralazine 12.5 mg twice a day.  I will repeat laboratory today to reassess renal function.  He will also be following up with [name] of nephrology.  He has no interest in pursuing future invasive assessment.  He continues to be on levothyroxine at 25 mcg for hypo-thyroidism.  He is on Hydrea for his essential thrombocythemia.  He is on Zetia 10 mg for hyperlipidemia.  He continues to take aspirin.  He continues to have issues with sleep maintenance.     Troy Sine, MD, [facility]  08/06/2017 6:27 PM

## 2017-08-06 NOTE — Telephone Encounter (Signed)
Returned call to patient. He is calling concerning new Rx for lunesta. He states he is willing to try this Rx but prefers triazolam 0.125mg , which he had a previous Rx in the past. He will call back if he wishes to change to this medication.   Patient also notified of lab results.   Routed to [employer] as Juluis Rainier

## 2017-08-12 ENCOUNTER — Ambulatory Visit (INDEPENDENT_AMBULATORY_CARE_PROVIDER_SITE_OTHER): Payer: Medicare Other | Admitting: Podiatry

## 2017-08-12 ENCOUNTER — Encounter: Payer: Self-pay | Admitting: Podiatry

## 2017-08-12 DIAGNOSIS — B351 Tinea unguium: Secondary | ICD-10-CM | POA: Diagnosis not present

## 2017-08-12 DIAGNOSIS — M79676 Pain in unspecified toe(s): Secondary | ICD-10-CM

## 2017-08-12 NOTE — Progress Notes (Signed)
Patient ID: Alexander [name], Dr., male   DOB: 06/24/1921, 84 y.o.   MRN: [id number] Complaint:  Visit Type: Patient returns to my office for continued preventative foot care services. Complaint: Patient states" my nails have grown long and thick and become painful to walk and wear shoes" . He presents for preventative foot care services. No changes to ROS  Podiatric Exam: Vascular: dorsalis pedis and posterior tibial pulses are palpable bilateral. Capillary return is immediate. Temperature gradient is WNL. Skin turgor WNL  Sensorium: Normal Semmes Weinstein monofilament test. Normal tactile sensation bilaterally. Nail Exam: Pt has thick disfigured discolored nails with subungual debris noted bilateral entire nail hallux through fifth toenails Ulcer Exam: There is no evidence of ulcer or pre-ulcerative changes or infection. Orthopedic Exam: Muscle tone and strength are WNL. No limitations in general ROM. No crepitus or effusions noted. Foot type and digits show no abnormalities. Bony prominences are unremarkable. Skin: No Porokeratosis. No infection or ulcers  Diagnosis:  Tinea unguium, Pain in right toe, pain in left toes  Treatment & Plan Procedures and Treatment: Consent by patient was obtained for treatment procedures. The patient understood the discussion of treatment and procedures well. All questions were answered thoroughly reviewed. Debridement of mycotic and hypertrophic toenails, 1 through 5 bilateral and clearing of subungual debris. No ulceration, no infection noted. ABN signed for 2019. Return Visit-Office Procedure: Patient instructed to return to the office for a follow up visit 9 weeks  for continued evaluation and treatment.   Gardiner Barefoot DPM

## 2017-08-13 DIAGNOSIS — H35363 Drusen (degenerative) of macula, bilateral: Secondary | ICD-10-CM | POA: Diagnosis not present

## 2017-08-13 DIAGNOSIS — H04129 Dry eye syndrome of unspecified lacrimal gland: Secondary | ICD-10-CM | POA: Diagnosis not present

## 2017-08-13 DIAGNOSIS — Z961 Presence of intraocular lens: Secondary | ICD-10-CM | POA: Diagnosis not present

## 2017-08-13 DIAGNOSIS — H353231 Exudative age-related macular degeneration, bilateral, with active choroidal neovascularization: Secondary | ICD-10-CM | POA: Diagnosis not present

## 2017-08-19 ENCOUNTER — Ambulatory Visit (HOSPITAL_COMMUNITY): Payer: Medicare Other | Attending: Cardiology

## 2017-08-19 ENCOUNTER — Other Ambulatory Visit: Payer: Self-pay

## 2017-08-19 ENCOUNTER — Telehealth: Payer: Self-pay | Admitting: Cardiovascular Disease

## 2017-08-19 DIAGNOSIS — I272 Pulmonary hypertension, unspecified: Secondary | ICD-10-CM | POA: Insufficient documentation

## 2017-08-19 DIAGNOSIS — R0602 Shortness of breath: Secondary | ICD-10-CM | POA: Insufficient documentation

## 2017-08-19 DIAGNOSIS — I082 Rheumatic disorders of both aortic and tricuspid valves: Secondary | ICD-10-CM | POA: Insufficient documentation

## 2017-08-19 DIAGNOSIS — I25700 Atherosclerosis of coronary artery bypass graft(s), unspecified, with unstable angina pectoris: Secondary | ICD-10-CM | POA: Diagnosis not present

## 2017-08-19 NOTE — Telephone Encounter (Signed)
New Message:      Pt is calling and wanting to speak with Alexander [name]. 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 Pt prefers to be called [name]

## 2017-08-19 NOTE — Telephone Encounter (Signed)
Returned call to Dr [name] he states that he tried the generic Lunesta and states that he does not like it at all. He states that he tried a medication  " a very long time ago" and would like to have triazolam 0.125mg . He would like #90 w/refills. I explained to pt that we may only be able to do 30 days but will ask Dr Claiborne Billings and call him

## 2017-08-19 NOTE — Telephone Encounter (Signed)
Alexander [name] has called him back

## 2017-08-21 NOTE — Telephone Encounter (Signed)
Triazolam is an old drug.  He is 84 years old.  If he wishes to try this drug.  I would give him the 0.125 mg dose, but recommend he try cutting it in half initially rather than take a whole pill.  Only give 30 pills without refill until we determine if this is effective.

## 2017-08-25 ENCOUNTER — Encounter: Payer: Self-pay | Admitting: *Deleted

## 2017-08-25 MED ORDER — TRIAZOLAM 0.125 MG PO TABS
ORAL_TABLET | ORAL | 0 refills | Status: DC
Start: 2017-08-25 — End: 2017-09-14

## 2017-08-25 NOTE — Telephone Encounter (Signed)
Left message to call back  

## 2017-08-25 NOTE — Telephone Encounter (Signed)
Spoke to patient and aware of recommendations.   Rx called into pharmacy.  Patient also aware of echo results.  Copy mailed to patient per request.

## 2017-09-01 DIAGNOSIS — N183 Chronic kidney disease, stage 3 (moderate): Secondary | ICD-10-CM | POA: Diagnosis not present

## 2017-09-01 DIAGNOSIS — R7301 Impaired fasting glucose: Secondary | ICD-10-CM | POA: Diagnosis not present

## 2017-09-01 DIAGNOSIS — I2510 Atherosclerotic heart disease of native coronary artery without angina pectoris: Secondary | ICD-10-CM | POA: Diagnosis not present

## 2017-09-01 DIAGNOSIS — R2689 Other abnormalities of gait and mobility: Secondary | ICD-10-CM | POA: Diagnosis not present

## 2017-09-01 DIAGNOSIS — D518 Other vitamin B12 deficiency anemias: Secondary | ICD-10-CM | POA: Diagnosis not present

## 2017-09-01 DIAGNOSIS — M810 Age-related osteoporosis without current pathological fracture: Secondary | ICD-10-CM | POA: Diagnosis not present

## 2017-09-01 DIAGNOSIS — Z6820 Body mass index (BMI) 20.0-20.9, adult: Secondary | ICD-10-CM | POA: Diagnosis not present

## 2017-09-01 DIAGNOSIS — D473 Essential (hemorrhagic) thrombocythemia: Secondary | ICD-10-CM | POA: Diagnosis not present

## 2017-09-01 DIAGNOSIS — E038 Other specified hypothyroidism: Secondary | ICD-10-CM | POA: Diagnosis not present

## 2017-09-07 ENCOUNTER — Ambulatory Visit: Payer: Medicare Other | Admitting: Cardiovascular Disease

## 2017-09-14 ENCOUNTER — Ambulatory Visit (INDEPENDENT_AMBULATORY_CARE_PROVIDER_SITE_OTHER): Payer: Medicare Other | Admitting: Cardiovascular Disease

## 2017-09-14 ENCOUNTER — Encounter: Payer: Self-pay | Admitting: Cardiovascular Disease

## 2017-09-14 VITALS — BP 117/69 | HR 62 | Ht 66.0 in | Wt 124.0 lb

## 2017-09-14 DIAGNOSIS — I351 Nonrheumatic aortic (valve) insufficiency: Secondary | ICD-10-CM | POA: Diagnosis not present

## 2017-09-14 DIAGNOSIS — N183 Chronic kidney disease, stage 3 unspecified: Secondary | ICD-10-CM

## 2017-09-14 DIAGNOSIS — I25700 Atherosclerosis of coronary artery bypass graft(s), unspecified, with unstable angina pectoris: Secondary | ICD-10-CM

## 2017-09-14 DIAGNOSIS — R0602 Shortness of breath: Secondary | ICD-10-CM

## 2017-09-14 NOTE — Progress Notes (Signed)
Patient ID: [name], Dr., male   DOB: November 22, 1921, 84 y.o.   MRN: [id number]     Primary M.D.: [name]  HPI: [name] is a 84 y.o. male who presents to the office for a 6 week followup cardiologic evaluation.  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  In 1996 while living in [place] he underwent CABG surgery and had a LIMA placed to the LAD, vein to the marginal, vein to the ramus intermediate, and vein to the PDA. In October 2011 catheterization by me showed severe CAD with 27 - 80% ostial left main stenosis, total occlusion of the LAD after diagonal, 95% ramus intermediate stenosis, total occlusion of the marginal branch of the circumflex coronary artery, and severe diffuse native RCA disease with total occlusion of the PDA at the ostium. A graft that supplied the marginal was occluded as was the vein graft that supplied the ramus intermediate vessel. He had patent vein graft supplying the PDA branch of his RCA and a patent LIMA to the LAD. He has been on medical therapy. I did try adding Ranexa to his medical regimen but he did not tolerate this. He does have mild renal insufficiency with creatinines in the 1.5-1.6 range which have been followed by [name] an now [name]. He has a hiatal hernia. He had been on Crestor as well as Vytorin in the past but admits to myalgias in the past on statins.  Recently, he had been on Zetia.  However, he has self discontinued the Zetia since he felt he was having some vague muscle aches.  He has renal insufficiency with creatinines in the 1.8 range.  He is now followed by [name] since [name] retired.  His last lipid panel in December 2015 showed a total cholesterol 133, triglycerides 133, LDL 64, HDL 42.    He is being followed by [name] for elevated platelets and these have now stabilized with the addition of hydroxyurea.  He was found to have a mutation in the JAK-2 gene consistent with a diagnosis  of essential thrombocythemia.   He underwent an echo Doppler study on 09/24/2015 which showed an EF of 60-65%.  There was grade 2 diastolic dysfunction and high ventricular filling pressure.  He had moderate to severely calcified aortic valve annulus with mildly calcified leaflets and mild aortic insufficiency.  He specifically denies any chest pain.  He is unaware of palpitations.  There is mild shortness of breath with activity.  Recent lipid studies were done on 03/20/2016 and showed a total cholesterol 164, HDL 31, LDL 83, VLDL 50, and triglycerides 251.  He has chronic kidney disease and is followed by nephrology.  Creatinine in August 2017 was 1.92.    He has been without chest pain.  He does note very mild shortness of breath with a walking or walking up steps.  A concern has been that of weight loss and he states that over the past year he has lost around 12-14 pounds.  He is followed by [name] and in February 2018 creatinine had risen to 2.09.  He tells me that this was recently rechecked and it had improved down to 1.9.  He admits to fatigue.  He continues to be on levothyroxine at 0.125 mg daily for hypothyroidism.  He is followed by hematology for his thrombocytosis and is on Hydrea.  When I last saw him, he was bradycardic and I recommended  reduction of his metoprolol dose down to 12.5 mg twice a day.  He has felt well with this adjustment.  He underwent an echo Doppler study on 10/07/2016, which continue to show normal systolic function with an EF of 55-60%.  There was restricted mobility of his noncoronary cusp as she is aortic valve with mild aortic insufficiency.  There was mild pulmonary hypertension with a PA pressure 37 mm.  There was grade 1 diastolic dysfunction.  His peak aortic gradient 6 mm.  Subsequent blood work in June showed further improvement in his creatinine to 1.80.  He has been having difficulty with sleep maintenance.  Typically he takes zolpidem 5 mg at bedtime, but  usually within 4 hours he is back awake and has had difficulty with sleep maintenance. He underwent surgery on his right toe due to ingrown toenail by [name] and tolerated surgery well including general anesthesia without cardiovascular compromise.    When I last saw him, he was noticing some mild increasing shortness of breath with activity.  He denied any chest tightness. some more shortness of breath with activity.  He denies chest tightness.  He had undergone recent laboratory [name].  His hemoglobin was 12, hematocrit 36.8, and MCV 118.  Serum creatinine had risen to 2.10.  At that time, I recommended that he discontinue losartan, which she was taking 25 mg daily and recommended initiation of low-dose nitrates/hydralazine.  I scheduled him for follow-up echo Doppler study.  A follow-up bmet the day of that office visit did show improvement in his creatinine 1.72.  He has continued to be off losartan.  He never instituted the nitrates/hydralazine preparation but has the medicine at home.  A follow-up echo Doppler study on 08/19/2017 showed an EF of 60-65% with grade 1 diastolic dysfunction.  There was mild aortic sclerosis with mild AR.  There was mild pulmonary hypertension with a peak PA pressure 39 mm.  His aortic root was very mildly dilated.  He presents for reevaluation  Past Medical History:  Diagnosis Date  . Allergic rhinitis   . Anemia   . Anemia, chronic renal failure 10/10/2013  . Anemia, normocytic normochromic 10/10/2013  . BPH (benign prostatic hyperplasia)   . Calcium oxalate renal stones   . CKD (chronic kidney disease), stage III ([place])   . Diverticulitis   . Dry senile macular degeneration   . Essential thrombocythemia ([place]) 10/10/2013  . GERD (gastroesophageal reflux disease)   . Hearing loss in left ear   . Heart murmur   . Hyperlipidemia   . Hypertension   . Migraines    OCULAR  . Peripheral neuropathy   . Renal lesion   . Spinal stenosis   .  Thrombocytosis ([place]) 08/18/2012   Platelets 711,000  Hb 14, WBC 8,500 08/05/12  . Vitamin deficiency     Past Surgical History:  Procedure Laterality Date  . APPENDECTOMY  1942  . CARDIAC CATHETERIZATION  02/19/2010   EF55%, medically treated,   . CORONARY ARTERY BYPASS GRAFT  1996  . INGUINAL HERNIA REPAIR     x2  . LAPAROSCOPIC CHOLECYSTECTOMY  1992  . left renal mass ablated  12/11  . NASAL CONCHA BULLOSA RESECTION    . NM MYOCAR PERF WALL MOTION  02/14/2010   post EF 71%, Exercise cap.7METS  . TONSILLECTOMY  1937  . TRANSTHORACIC ECHOCARDIOGRAM  12/10/2011   XVQM>08%, stage1 diastolic dysfunction, mild to mod. LAE,   . TRANSTHORACIC ECHOCARDIOGRAM  07/25/2008   mild-moderate MR,  mild-modTricuspid regurg.    Allergies  Allergen Reactions  . Pollen Extract     ragweed    Current Outpatient Medications  Medication Sig Dispense Refill  . aluminum & magnesium hydroxide-simethicone (MYLANTA) 500-450-40 MG/5ML suspension Take by mouth every 6 (six) hours as needed. 200-200-25m/5ml     . aspirin 81 MG tablet Take 81 mg by mouth daily.      . cholecalciferol (VITAMIN D) 1000 UNITS tablet Take 2,000 Units by mouth daily.     . Coenzyme Q10 (CO Q-10) 300 MG CAPS Take 1 capsule by mouth daily.    . Cyanocobalamin (VITAMIN B-12 IJ) Inject as directed. monthly     . denosumab (PROLIA) 60 MG/ML SOLN injection Inject 60 mg into the skin every 6 (six) months. Administer in upper arm, thigh, or abdomen    . ezetimibe (ZETIA) 10 MG tablet Take 10 mg by mouth daily.    . finasteride (PROSCAR) 5 MG tablet Take 1 tablet by mouth 3 (three) times a week. TIW    . hydroxyurea (HYDREA) 500 MG capsule TAKE 1 CAPSULE BY MOUTH  DAILY. MAY TAKE WITH FOOD  TO MINIMIZE GI SIDE  EFFECTS. 90 capsule 3  . levothyroxine (SYNTHROID, LEVOTHROID) 25 MCG tablet Take 25 mcg by mouth daily before breakfast.    . LUTEIN PO Take 6 mg by mouth daily.     . metoprolol tartrate (LOPRESSOR) 25 MG tablet Take 0.5  tablets (12.5 mg total) by mouth 2 (two) times daily. (Patient taking differently: Take 25 mg by mouth daily. Take 1 25 mg tablet daily) 30 tablet 6  . Multiple Vitamin (MULTIVITAMIN) tablet Take 1 tablet by mouth daily.      . Multiple Vitamins-Minerals (PRESERVISION AREDS PO) Take 1 capsule by mouth 2 (two) times daily.      . ranitidine (ZANTAC) 150 MG capsule Take 150 mg by mouth daily.     No current facility-administered medications for this visit.     Social History   Socioeconomic History  . Marital status: Widowed    Spouse name: Not on file  . Number of children: 3  . Years of education: Not on file  . Highest education level: Not on file  Occupational History  . Not on file  Social Needs  . Financial resource strain: Not on file  . Food insecurity:    Worry: Not on file    Inability: Not on file  . Transportation needs:    Medical: Not on file    Non-medical: Not on file  Tobacco Use  . Smoking status: Never Smoker  . Smokeless tobacco: Never Used  Substance and Sexual Activity  . Alcohol use: No  . Drug use: No  . Sexual activity: Not on file  Lifestyle  . Physical activity:    Days per week: Not on file    Minutes per session: Not on file  . Stress: Not on file  Relationships  . Social connections:    Talks on phone: Not on file    Gets together: Not on file    Attends religious service: Not on file    Active member of club or organization: Not on file    Attends meetings of clubs or organizations: Not on file    Relationship status: Not on file  . Intimate partner violence:    Fear of current or ex partner: Not on file    Emotionally abused: Not on file    Physically abused: Not on file  Forced sexual activity: Not on file  Other Topics Concern  . Not on file  Social History Narrative  . Not on file    Socially he is widowed. He is retired Sales promotion account executive.  He has 3 children and 9 grandchildren. There's no tobacco  use.   ROS General: Negative; No fevers, chills, or night sweats;  HEENT: Negative; No changes in vision or hearing, sinus congestion, difficulty swallowing Pulmonary: Negative; No cough, wheezing, shortness of breath, hemoptysis Cardiovascular:  See HPI; No chest pain, presyncope, syncope, palpitations GI: Positive for a large hiatal hernia; No nausea, vomiting, diarrhea, or abdominal pain GU: Negative; No dysuria, hematuria, or difficulty voiding Musculoskeletal: Negative; no myalgias, joint pain, or weakness Hematologic/Oncology: He is followed by [name] for thrombocytosis.; no easy bruising, bleeding Endocrine: Negative; no heat/cold intolerance; no diabetes Neuro: Negative; no changes in balance, headaches Skin: Negative; No rashes or skin lesions Psychiatric: Negative; No behavioral problems, depression Sleep: Negative; No snoring, daytime sleepiness, hypersomnolence, bruxism, restless legs, hypnogognic hallucinations, no cataplexy Other comprehensive 14 point system review is negative.   PE BP 117/69   Pulse 62   Ht 5' 6"  (1.676 m)   Wt 124 lb (56.2 kg)   SpO2 94%   BMI 20.01 kg/m    Repeat blood pressure today was stable at 124/70  Wt Readings from Last 3 Encounters:  09/14/17 124 lb (56.2 kg)  08/05/17 128 lb 3.2 oz (58.2 kg)  07/27/17 126 lb 4.8 oz (57.3 kg)     Physical Exam BP 117/69   Pulse 62   Ht 5' 6"  (1.676 m)   Wt 124 lb (56.2 kg)   SpO2 94%   BMI 20.01 kg/m  General: Alert, oriented, no distress. .  Slightly more frail. Skin: normal turgor, no rashes, warm and dry HEENT: Normocephalic, atraumatic. Pupils equal round and reactive to light; sclera anicteric; extraocular muscles intact;  Nose without nasal septal hypertrophy Mouth/Parynx benign; Mallinpatti scale 3 Neck: No JVD, no carotid bruits; normal carotid upstroke Lungs: clear to ausculatation and percussion; no wheezing or rales Chest wall: without tenderness to palpitation Heart:  PMI not displaced, RRR, s1 s2 normal, 1/6 systolic murmur, no diastolic murmur, no rubs, gallops, thrills, or heaves Abdomen: soft, nontender; no hepatosplenomehaly, BS+; abdominal aorta nontender and not dilated by palpation. Back: no CVA tenderness Pulses 2+ Musculoskeletal: full range of motion, normal strength, no joint deformities Extremities: no clubbing cyanosis or edema, Homan's sign negative  Neurologic: grossly nonfocal; Cranial nerves grossly wnl Psychologic: Normal mood and affect   08/05/2017 ECG (independently read by me): Sinus bradycardia 56 bpm.  No ectopy.  Normal intervals  September 2018 ECG (independently read by me): Normal sinus rhythm at 64 bpm.  Probable left atrial dilatation.  Poor anterior R-wave progression.  QTc interval 410 ms.  May 2018 ECG (independently read by me): Sinus bradycardia at 48 bpm.  PR interval 194 ms.  QTc interval 416 ms.  No ST segment changes.  November 2017 ECG (independently read by me): Sinus bradycardia 51 bpm.  No ectopy.  Normal intervals.  April 2017 ECG (independently read by me): Sinus bradycardia 53 bpm.  Poor progression anteriorly.  October 2016 ECG (independently read by me): Sinus bradycardia 54 bpm.  Poor progression V1 through V3.  Normal intervals.  September 2015 ECG (independently read by me): Sinus bradycardia 52 beats per minute.  Normal intervals.  No ST segment changes.  06/08/2013 ECG: Sinus bradycardia 49 beats per minute. No ectopy. Normal intervals.  Prior ECG of 11/30/2012: Sinus rhythm at 53 beats per minute. Normal intervals.  LABS: BMP Latest Ref Rng & Units 08/05/2017 07/27/2017 03/18/2017  Glucose 65 - 99 mg/dL 88 67 78  BUN 10 - 36 mg/dL 31 34 37(H)  Creatinine 0.76 - 1.27 mg/dL 1.72(H) 2.10(H) 1.81(H)  BUN/Creat Ratio 10 - 24 18 16 20   Sodium 134 - 144 mmol/L 141 143 139  Potassium 3.5 - 5.2 mmol/L 4.8 4.4 5.2  Chloride 96 - 106 mmol/L 109(H) 108(H) 106  CO2 20 - 29 mmol/L 20 20 20   Calcium 8.6 -  10.2 mg/dL 8.9 8.8 9.0   Hepatic Function Latest Ref Rng & Units 07/27/2017 03/18/2017 10/14/2016  Total Protein 6.0 - 8.5 g/dL 6.6 6.7 6.5  Albumin 3.2 - 4.6 g/dL 4.1 4.2 4.1  AST 0 - 40 IU/L 22 26 19   ALT 0 - 44 IU/L 17 18 13   Alk Phosphatase 39 - 117 IU/L 52 53 53  Total Bilirubin 0.0 - 1.2 mg/dL 0.4 0.4 0.5   CBC Latest Ref Rng & Units 07/27/2017 06/10/2017 03/18/2017  WBC 3.4 - 10.8 x10E3/uL 4.8 5.1 5.6  Hemoglobin 13.0 - 17.7 g/dL 12.0(L) 12.6(L) 12.0(L)  Hematocrit 37.5 - 51.0 % 36.8(L) 36.5(L) 36.0(L)  Platelets 150 - 379 x10E3/uL 254 245 244   Lab Results  Component Value Date   MCV 118 (H) 07/27/2017   MCV 117 (H) 06/10/2017   MCV 116 (H) 03/18/2017   Lab Results  Component Value Date   TSH 4.049 05/01/2014  No results found for: HGBA1C   Lipid Panel     Component Value Date/Time   CHOL 164 03/20/2016 1109   CHOL 163 09/12/2015 1138   TRIG 251 (H) 03/20/2016 1109   HDL 31 (L) 03/20/2016 1109   HDL 33 (L) 09/12/2015 1138   CHOLHDL 5.3 (H) 03/20/2016 1109   VLDL 50 (H) 03/20/2016 1109   LDLCALC 83 03/20/2016 1109   LDLCALC 85 09/12/2015 1138    RADIOLOGY: No results found.   IMPRESSION:  1. Coronary artery disease involving coronary bypass graft of native heart with unstable angina pectoris ([place])   2. Shortness of breath   3. CKD (chronic kidney disease) stage 4, GFR 15-29 ml/min ([facility])   4. 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 He has documented graft occlusion of 2 of his grafts and has severe native coronary artery disease. Remotely, we tried adding Ranexa to his medication but he has stopped this due to concerns of tolerability.  He is on a reduced dose of metoprolol due to previous documentation of bradycardia.  His echo Doppler study one year ago showed no systolic function  with grade 1 diastolic dysfunction, aortic sclerosis with mild restriction of the noncoronary cusp and mild aortic insufficiency.  Recently he had noticed increasing shortness of breath with activity.  This may be related to his underlying coronary artery disease as well as potential progressive aortic valve disease or diastolic dysfunction. .  I reviewed his follow-up echo Doppler study with him in detail.  This confirmed continuation of normal systolic and-diastolic function.  There was grade 1 diastolic dysfunction. Aortic root dimension measured 39 mm.  There was mild pulmonary hypertension with PA peak pressure at 39 mm.  His renal function improved and he has been off losartan, which was discontinued around  the time of the repeat laboratory.  He never started the low-dose nitrates and hydralazine.  His blood pressure today continues to be stable.  He is on Zetia for hyperlipidemia.  He continues to be on very low-dose levothyroxine at 25 g.  He continues to take aspirin 81 mg.  He continues to be on Hydrea and probably a for his thrombocytosis.  I will see him in 6 months for reevaluation. Troy Sine, MD, [facility]  09/14/2017 6:33 PM

## 2017-09-14 NOTE — Patient Instructions (Signed)
Follow-Up: Your physician wants you to follow-up in: 6 months with Dr. Claiborne Billings.  You will receive a reminder letter in the mail two months in advance. If you don't receive a letter, please call our office to schedule the follow-up appointment.     If you need a refill on your cardiac medications before your next appointment, please call your pharmacy.

## 2017-10-01 DIAGNOSIS — H353221 Exudative age-related macular degeneration, left eye, with active choroidal neovascularization: Secondary | ICD-10-CM | POA: Diagnosis not present

## 2017-10-01 DIAGNOSIS — H353211 Exudative age-related macular degeneration, right eye, with active choroidal neovascularization: Secondary | ICD-10-CM | POA: Diagnosis not present

## 2017-10-01 DIAGNOSIS — H35722 Serous detachment of retinal pigment epithelium, left eye: Secondary | ICD-10-CM | POA: Diagnosis not present

## 2017-10-05 ENCOUNTER — Telehealth: Payer: Self-pay | Admitting: Cardiovascular Disease

## 2017-10-05 DIAGNOSIS — M542 Cervicalgia: Secondary | ICD-10-CM | POA: Diagnosis not present

## 2017-10-05 MED ORDER — TRIAZOLAM 0.125 MG PO TABS: 0.1250 mg | ORAL_TABLET | Freq: Every evening | ORAL | 0 refills | Status: DC | PRN

## 2017-10-05 NOTE — Telephone Encounter (Signed)
Spoke with pt, he did a trial of triazolam to help him sleep and he is calling to report it worked well and he would like a script called to the mail order pharmacy. [facility].

## 2017-10-05 NOTE — Telephone Encounter (Signed)
New Message    Patient would like you to call and discuss the sleeping medication he has been taking for the last month. Please call as soon as you can to go over the results of this month, he has patients to see.

## 2017-10-07 ENCOUNTER — Other Ambulatory Visit (INDEPENDENT_AMBULATORY_CARE_PROVIDER_SITE_OTHER): Payer: Medicare Other

## 2017-10-07 ENCOUNTER — Other Ambulatory Visit: Payer: Self-pay | Admitting: *Deleted

## 2017-10-07 DIAGNOSIS — D473 Essential (hemorrhagic) thrombocythemia: Secondary | ICD-10-CM | POA: Diagnosis not present

## 2017-10-07 DIAGNOSIS — N183 Chronic kidney disease, stage 3 unspecified: Secondary | ICD-10-CM

## 2017-10-07 DIAGNOSIS — I10 Essential (primary) hypertension: Secondary | ICD-10-CM

## 2017-10-07 NOTE — Addendum Note (Signed)
Addended by: Orson Gear on: 10/07/2017 02:16 PM   Modules accepted: Orders

## 2017-10-08 ENCOUNTER — Telehealth: Payer: Self-pay | Admitting: *Deleted

## 2017-10-08 LAB — LACTATE DEHYDROGENASE: LDH: 190 IU/L (ref 121–224)

## 2017-10-08 LAB — CMP14 + ANION GAP
ALK PHOS: 51 IU/L (ref 39–117)
ALT: 19 IU/L (ref 0–44)
AST: 25 IU/L (ref 0–40)
Albumin/Globulin Ratio: 1.7 (ref 1.2–2.2)
Albumin: 4.2 g/dL (ref 3.2–4.6)
Anion Gap: 16 mmol/L (ref 10.0–18.0)
BILIRUBIN TOTAL: 0.6 mg/dL (ref 0.0–1.2)
BUN/Creatinine Ratio: 14 (ref 10–24)
BUN: 26 mg/dL (ref 10–36)
CHLORIDE: 108 mmol/L — AB (ref 96–106)
CO2: 18 mmol/L — AB (ref 20–29)
Calcium: 8.4 mg/dL — ABNORMAL LOW (ref 8.6–10.2)
Creatinine, Ser: 1.86 mg/dL — ABNORMAL HIGH (ref 0.76–1.27)
GFR calc Af Amer: 35 mL/min/{1.73_m2} — ABNORMAL LOW (ref >59)
GFR calc non Af Amer: 31 mL/min/{1.73_m2} — ABNORMAL LOW (ref >59)
GLUCOSE: 87 mg/dL (ref 65–99)
Globulin, Total: 2.5 g/dL (ref 1.5–4.5)
Potassium: 4.5 mmol/L (ref 3.5–5.2)
Sodium: 142 mmol/L (ref 134–144)
TOTAL PROTEIN: 6.7 g/dL (ref 6.0–8.5)

## 2017-10-08 LAB — CBC WITH DIFFERENTIAL/PLATELET
BASOS ABS: 0.1 10*3/uL (ref 0.0–0.2)
Basos: 1 %
EOS (ABSOLUTE): 0.2 10*3/uL (ref 0.0–0.4)
Eos: 4 %
HEMOGLOBIN: 12.8 g/dL — AB (ref 13.0–17.7)
Hematocrit: 38.8 % (ref 37.5–51.0)
Immature Grans (Abs): 0 10*3/uL (ref 0.0–0.1)
Immature Granulocytes: 0 %
LYMPHS ABS: 1.5 10*3/uL (ref 0.7–3.1)
Lymphs: 35 %
MCH: 38.4 pg — ABNORMAL HIGH (ref 26.6–33.0)
MCHC: 33 g/dL (ref 31.5–35.7)
MCV: 117 fL — ABNORMAL HIGH (ref 79–97)
MONOCYTES: 13 %
Monocytes Absolute: 0.6 10*3/uL (ref 0.1–0.9)
Neutrophils Absolute: 2 10*3/uL (ref 1.4–7.0)
Neutrophils: 47 %
PLATELETS: 275 10*3/uL (ref 150–450)
RBC: 3.33 x10E6/uL — AB (ref 4.14–5.80)
RDW: 14.1 % (ref 12.3–15.4)
WBC: 4.3 10*3/uL (ref 3.4–10.8)

## 2017-10-08 LAB — TSH: TSH: 4.33 u[IU]/mL (ref 0.450–4.500)

## 2017-10-08 NOTE — Telephone Encounter (Signed)
Pt called / informed "Hb 12.8, platelets 275,000. Creatinine a little higher than last time but within range of previous values. TSH upper normal @ 4.3. We will forward results to Dr Joylene Draft" per Dr [name]. Stated to let him know things are going and of any changes and thanks for calling.

## 2017-10-08 NOTE — Telephone Encounter (Signed)
-----   Message from Annia Belt, MD sent at 10/08/2017 11:28 AM EDT ----- Please Call Dr [name] w lab results: Hb 12.8, platelets 275,000. Creatinine a little higher than last time but within range of previous values. TSH upper normal @ 4.3. We will forward results to Dr Joylene Draft

## 2017-10-08 NOTE — Progress Notes (Signed)
A copy of the lab results from 10-08-2017 have been faxed to   1.  Dr Pearson Grippe, [facility], [phone] at [phone]  2.  Dr. Crist Infante, [facility], [phone] at [place], [facility]   [phone]  10-08-17

## 2017-10-12 ENCOUNTER — Other Ambulatory Visit: Payer: Self-pay | Admitting: Oncology

## 2017-10-13 ENCOUNTER — Other Ambulatory Visit: Payer: Self-pay | Admitting: Internal Medicine

## 2017-10-13 DIAGNOSIS — M542 Cervicalgia: Secondary | ICD-10-CM

## 2017-10-14 ENCOUNTER — Ambulatory Visit: Payer: Medicare Other | Admitting: Podiatry

## 2017-10-14 ENCOUNTER — Telehealth: Payer: Self-pay | Admitting: Cardiovascular Disease

## 2017-10-14 NOTE — Telephone Encounter (Signed)
If this is the only medication that he can take for the reduced co-pay can try 7.5 mg hs

## 2017-10-14 NOTE — Telephone Encounter (Signed)
Spoke with Dr [name] the patient. Patient states Dr Claiborne Billings had given him to try a medication triazolam for sleep for a month. The medication did well and new prescription for 90 days,but this medication cost $280 for 90 day supply.   Patient would like to try something on his formulary that is $0 co pay. Patient would like a month supply of temazepam to see if this works and if so will cal back for a 90 supply.  Patient aware will defer to Dr Claiborne Billings and CONTACT WHEN PRESCRIPTION IS SENT to [facility]

## 2017-10-15 MED ORDER — TEMAZEPAM 7.5 MG PO CAPS: 7.5000 mg | ORAL_CAPSULE | Freq: Every evening | ORAL | 2 refills | Status: DC | PRN

## 2017-10-15 NOTE — Telephone Encounter (Signed)
LEFT MESSAGE ON ANSWER MACHINE - MEDICATION  WAS SENT  PHARMACY PER REQUEST.   RN CALLED MEDICATION INTO PHARMACY - TEMAZEPAM 7.5MG  QHS ,PRN.

## 2017-10-19 ENCOUNTER — Telehealth: Payer: Self-pay | Admitting: *Deleted

## 2017-10-19 ENCOUNTER — Ambulatory Visit
Admission: RE | Admit: 2017-10-19 | Discharge: 2017-10-19 | Disposition: A | Discharge status: Home / Self Care | Payer: Medicare Other | Source: Ambulatory Visit | Attending: Internal Medicine | Admitting: Internal Medicine

## 2017-10-19 DIAGNOSIS — M542 Cervicalgia: Secondary | ICD-10-CM

## 2017-10-19 NOTE — Telephone Encounter (Signed)
PATIENT CALLED. PATIENT DISCUSS WITH PHARMACIST  - IN REGARDS TO 7.5 MG TEMAZEPAM. PATIENT STATES  THAT HE  DOES NOT THINK THIS DOSE MAY BE BE EFFECTIVE ENOUGH,WOULD LIKE TO KNOW IF DR KELLY WOULD CONSIDER  INCREASING TO 15 MG.  FOR HIM TO TRY. Patient has not tried or picked up the medication yet.   RN informed patient will defer to Dr Claiborne Billings and someone will call him back with answer and comment

## 2017-10-20 NOTE — Telephone Encounter (Signed)
Will try initiating at 7.5 mg since he is 84 years old.  He can try this for several nights.  If he does not seem to be effective then he can take 2 pills for 15 mg dosing.  We can then renew his next prescription at the higher level if necessary

## 2017-10-21 ENCOUNTER — Ambulatory Visit: Payer: Medicare Other | Admitting: Podiatry

## 2017-10-21 ENCOUNTER — Ambulatory Visit (INDEPENDENT_AMBULATORY_CARE_PROVIDER_SITE_OTHER): Payer: Medicare Other | Admitting: Podiatry

## 2017-10-21 ENCOUNTER — Encounter: Payer: Self-pay | Admitting: Podiatry

## 2017-10-21 DIAGNOSIS — B351 Tinea unguium: Secondary | ICD-10-CM

## 2017-10-21 DIAGNOSIS — M79676 Pain in unspecified toe(s): Secondary | ICD-10-CM | POA: Diagnosis not present

## 2017-10-21 NOTE — Progress Notes (Signed)
Patient ID: Alexander [name], Dr., male   DOB: 04/14/1922, 84 y.o.   MRN: [id number] Complaint:  Visit Type: Patient returns to my office for continued preventative foot care services. Complaint: Patient states" my nails have grown long and thick and become painful to walk and wear shoes" . He presents for preventative foot care services. No changes to ROS  Podiatric Exam: Vascular: dorsalis pedis and posterior tibial pulses are palpable bilateral. Capillary return is immediate. Temperature gradient is WNL. Skin turgor WNL  Sensorium: Normal Semmes Weinstein monofilament test. Normal tactile sensation bilaterally. Nail Exam: Pt has thick disfigured discolored nails with subungual debris noted bilateral entire nail hallux through fifth toenails Ulcer Exam: There is no evidence of ulcer or pre-ulcerative changes or infection. Orthopedic Exam: Muscle tone and strength are WNL. No limitations in general ROM. No crepitus or effusions noted. Foot type and digits show no abnormalities. Bony prominences are unremarkable. Skin: No Porokeratosis. No infection or ulcers  Diagnosis:  Tinea unguium, Pain in right toe, pain in left toes  Treatment & Plan Procedures and Treatment: Consent by patient was obtained for treatment procedures. The patient understood the discussion of treatment and procedures well. All questions were answered thoroughly reviewed. Debridement of mycotic and hypertrophic toenails, 1 through 5 bilateral and clearing of subungual debris. No ulceration, no infection noted. ABN signed for 2019. Return Visit-Office Procedure: Patient instructed to return to the office for a follow up visit 6 weeks  for continued evaluation and treatment.   Avynn Klassen DPM 

## 2017-10-21 NOTE — Telephone Encounter (Signed)
Spoke to patient, he agrees to try 7.25 mg and will call if not effective.

## 2017-11-16 DIAGNOSIS — L570 Actinic keratosis: Secondary | ICD-10-CM | POA: Diagnosis not present

## 2017-11-16 DIAGNOSIS — L821 Other seborrheic keratosis: Secondary | ICD-10-CM | POA: Diagnosis not present

## 2017-11-19 DIAGNOSIS — H353231 Exudative age-related macular degeneration, bilateral, with active choroidal neovascularization: Secondary | ICD-10-CM | POA: Diagnosis not present

## 2017-11-19 DIAGNOSIS — Z961 Presence of intraocular lens: Secondary | ICD-10-CM | POA: Diagnosis not present

## 2017-12-02 ENCOUNTER — Ambulatory Visit (INDEPENDENT_AMBULATORY_CARE_PROVIDER_SITE_OTHER): Payer: Medicare Other | Admitting: Podiatry

## 2017-12-02 ENCOUNTER — Encounter: Payer: Self-pay | Admitting: Podiatry

## 2017-12-02 DIAGNOSIS — M79676 Pain in unspecified toe(s): Secondary | ICD-10-CM

## 2017-12-02 DIAGNOSIS — B351 Tinea unguium: Secondary | ICD-10-CM | POA: Diagnosis not present

## 2017-12-02 NOTE — Progress Notes (Signed)
Patient ID: Alexander [name], Dr., male   DOB: 08-29-1921, 84 y.o.   MRN: [id number] Complaint:  Visit Type: Patient returns to my office for continued preventative foot care services. Complaint: Patient states" my nails have grown long and thick and become painful to walk and wear shoes" . He presents for preventative foot care services. No changes to ROS  Podiatric Exam: Vascular: dorsalis pedis and posterior tibial pulses are palpable bilateral. Capillary return is immediate. Temperature gradient is WNL. Skin turgor WNL  Sensorium: Normal Semmes Weinstein monofilament test. Normal tactile sensation bilaterally. Nail Exam: Pt has thick disfigured discolored nails with subungual debris noted bilateral entire nail hallux through fifth toenails Ulcer Exam: There is no evidence of ulcer or pre-ulcerative changes or infection. Orthopedic Exam: Muscle tone and strength are WNL. No limitations in general ROM. No crepitus or effusions noted. Foot type and digits show no abnormalities. Bony prominences are unremarkable. Skin: No Porokeratosis. No infection or ulcers  Diagnosis:  Tinea unguium, Pain in right toe, pain in left toes  Treatment & Plan Procedures and Treatment: Consent by patient was obtained for treatment procedures. The patient understood the discussion of treatment and procedures well. All questions were answered thoroughly reviewed. Debridement of mycotic and hypertrophic toenails, 1 through 5 bilateral and clearing of subungual debris. No ulceration, no infection noted. ABN signed for 2019. Return Visit-Office Procedure: Patient instructed to return to the office for a follow up visit 6 weeks  for continued evaluation and treatment.   Gardiner Barefoot DPM

## 2017-12-23 ENCOUNTER — Other Ambulatory Visit: Payer: Self-pay | Admitting: Oncology

## 2017-12-23 ENCOUNTER — Other Ambulatory Visit (INDEPENDENT_AMBULATORY_CARE_PROVIDER_SITE_OTHER): Payer: Medicare Other

## 2017-12-23 DIAGNOSIS — R7309 Other abnormal glucose: Secondary | ICD-10-CM | POA: Diagnosis not present

## 2017-12-23 DIAGNOSIS — Z0000 Encounter for general adult medical examination without abnormal findings: Secondary | ICD-10-CM

## 2017-12-23 DIAGNOSIS — N183 Chronic kidney disease, stage 3 unspecified: Secondary | ICD-10-CM

## 2017-12-23 DIAGNOSIS — D473 Essential (hemorrhagic) thrombocythemia: Secondary | ICD-10-CM | POA: Diagnosis not present

## 2017-12-24 ENCOUNTER — Telehealth: Payer: Self-pay | Admitting: *Deleted

## 2017-12-24 LAB — COMPREHENSIVE METABOLIC PANEL
ALK PHOS: 57 IU/L (ref 39–117)
ALT: 17 IU/L (ref 0–44)
AST: 23 IU/L (ref 0–40)
Albumin/Globulin Ratio: 1.4 (ref 1.2–2.2)
Albumin: 4.1 g/dL (ref 3.2–4.6)
BUN / CREAT RATIO: 18 (ref 10–24)
BUN: 29 mg/dL (ref 10–36)
Bilirubin Total: 0.6 mg/dL (ref 0.0–1.2)
CO2: 21 mmol/L (ref 20–29)
CREATININE: 1.62 mg/dL — AB (ref 0.76–1.27)
Calcium: 8.9 mg/dL (ref 8.6–10.2)
Chloride: 108 mmol/L — ABNORMAL HIGH (ref 96–106)
GFR calc non Af Amer: 36 mL/min/{1.73_m2} — ABNORMAL LOW (ref >59)
GFR, EST AFRICAN AMERICAN: 42 mL/min/{1.73_m2} — AB (ref >59)
GLUCOSE: 100 mg/dL — AB (ref 65–99)
Globulin, Total: 2.9 g/dL (ref 1.5–4.5)
Potassium: 4.7 mmol/L (ref 3.5–5.2)
Sodium: 143 mmol/L (ref 134–144)
TOTAL PROTEIN: 7 g/dL (ref 6.0–8.5)

## 2017-12-24 LAB — CBC WITH DIFFERENTIAL/PLATELET
Basophils Absolute: 0.1 10*3/uL (ref 0.0–0.2)
Basos: 1 %
EOS (ABSOLUTE): 0.2 10*3/uL (ref 0.0–0.4)
EOS: 3 %
HEMATOCRIT: 38.4 % (ref 37.5–51.0)
HEMOGLOBIN: 12.9 g/dL — AB (ref 13.0–17.7)
IMMATURE GRANS (ABS): 0 10*3/uL (ref 0.0–0.1)
Immature Granulocytes: 0 %
LYMPHS ABS: 2.1 10*3/uL (ref 0.7–3.1)
Lymphs: 38 %
MCH: 38.5 pg — ABNORMAL HIGH (ref 26.6–33.0)
MCHC: 33.6 g/dL (ref 31.5–35.7)
MCV: 115 fL — ABNORMAL HIGH (ref 79–97)
MONOCYTES: 14 %
Monocytes Absolute: 0.8 10*3/uL (ref 0.1–0.9)
Neutrophils Absolute: 2.5 10*3/uL (ref 1.4–7.0)
Neutrophils: 44 %
Platelets: 256 10*3/uL (ref 150–450)
RBC: 3.35 x10E6/uL — AB (ref 4.14–5.80)
RDW: 14.4 % (ref 12.3–15.4)
WBC: 5.6 10*3/uL (ref 3.4–10.8)

## 2017-12-24 LAB — HEMOGLOBIN A1C
Est. average glucose Bld gHb Est-mCnc: 111 mg/dL
Hgb A1c MFr Bld: 5.5 % (ref 4.8–5.6)

## 2017-12-24 LAB — LACTATE DEHYDROGENASE: LDH: 199 IU/L (ref 121–224)

## 2017-12-24 LAB — URIC ACID: Uric Acid: 5.4 mg/dL (ref 3.7–8.6)

## 2017-12-24 NOTE — Telephone Encounter (Signed)
Called pt - no answer; left message to give me a call back. 

## 2017-12-24 NOTE — Progress Notes (Signed)
Lab results from 12-23-17 faxed to the following 12-24-2017 at 10:08  [facility], Dr Pearson Grippe at [phone]  [facility], Dr Crist Infante at [place], PBT 12-24-17 10:42

## 2017-12-24 NOTE — Telephone Encounter (Signed)
Pt informed of lab result -  " look good; stay on current dose of Hydrea" per Dr [name]. And Tracy,lab, has mailed results to him. He asked about A1C result - which is 5.5. And thanks to Dr Darnell Level,

## 2017-12-24 NOTE — Telephone Encounter (Signed)
-----   Message from Annia Belt, MD sent at 12/24/2017  9:10 AM EDT ----- Please call pt w counts: look good; stay on current dose of Hydrea

## 2018-01-07 DIAGNOSIS — H353231 Exudative age-related macular degeneration, bilateral, with active choroidal neovascularization: Secondary | ICD-10-CM | POA: Diagnosis not present

## 2018-01-07 DIAGNOSIS — H353221 Exudative age-related macular degeneration, left eye, with active choroidal neovascularization: Secondary | ICD-10-CM | POA: Diagnosis not present

## 2018-01-08 ENCOUNTER — Encounter (HOSPITAL_COMMUNITY): Payer: Self-pay

## 2018-01-08 ENCOUNTER — Encounter (HOSPITAL_COMMUNITY)
Admission: RE | Admit: 2018-01-08 | Discharge: 2018-01-08 | Disposition: A | Discharge status: Home / Self Care | Payer: Medicare Other | Source: Ambulatory Visit | Attending: Internal Medicine | Admitting: Internal Medicine

## 2018-01-08 DIAGNOSIS — M810 Age-related osteoporosis without current pathological fracture: Secondary | ICD-10-CM | POA: Diagnosis not present

## 2018-01-08 MED ORDER — DENOSUMAB 60 MG/ML ~~LOC~~ SOSY
60.0000 mg | PREFILLED_SYRINGE | Freq: Once | SUBCUTANEOUS | Status: AC
  Administered 2018-01-08: 60 mg via SUBCUTANEOUS
  Filled 2018-01-08: qty 1

## 2018-01-08 NOTE — Discharge Instructions (Signed)
Denosumab injection °What is this medicine? °DENOSUMAB (den oh sue mab) slows bone breakdown. Prolia is used to treat osteoporosis in women after menopause and in men. Xgeva is used to treat a high calcium level due to cancer and to prevent bone fractures and other bone problems caused by multiple myeloma or cancer bone metastases. Xgeva is also used to treat giant cell tumor of the bone. °This medicine may be used for other purposes; ask your health care provider or pharmacist if you have questions. °COMMON BRAND NAME(S): Prolia, XGEVA °What should I tell my health care provider before I take this medicine? °They need to know if you have any of these conditions: °-dental disease °-having surgery or tooth extraction °-infection °-kidney disease °-low levels of calcium or Vitamin D in the blood °-malnutrition °-on hemodialysis °-skin conditions or sensitivity °-thyroid or parathyroid disease °-an unusual reaction to denosumab, other medicines, foods, dyes, or preservatives °-pregnant or trying to get pregnant °-breast-feeding °How should I use this medicine? °This medicine is for injection under the skin. It is given by a health care professional in a hospital or clinic setting. °If you are getting Prolia, a special MedGuide will be given to you by the pharmacist with each prescription and refill. Be sure to read this information carefully each time. °For Prolia, talk to your pediatrician regarding the use of this medicine in children. Special care may be needed. For Xgeva, talk to your pediatrician regarding the use of this medicine in children. While this drug may be prescribed for children as young as 13 years for selected conditions, precautions do apply. °Overdosage: If you think you have taken too much of this medicine contact a poison control center or emergency room at once. °NOTE: This medicine is only for you. Do not share this medicine with others. °What if I miss a dose? °It is important not to miss your  dose. Call your doctor or health care professional if you are unable to keep an appointment. °What may interact with this medicine? °Do not take this medicine with any of the following medications: °-other medicines containing denosumab °This medicine may also interact with the following medications: °-medicines that lower your chance of fighting infection °-steroid medicines like prednisone or cortisone °This list may not describe all possible interactions. Give your health care provider a list of all the medicines, herbs, non-prescription drugs, or dietary supplements you use. Also tell them if you smoke, drink alcohol, or use illegal drugs. Some items may interact with your medicine. °What should I watch for while using this medicine? °Visit your doctor or health care professional for regular checks on your progress. Your doctor or health care professional may order blood tests and other tests to see how you are doing. °Call your doctor or health care professional for advice if you get a fever, chills or sore throat, or other symptoms of a cold or flu. Do not treat yourself. This drug may decrease your body's ability to fight infection. Try to avoid being around people who are sick. °You should make sure you get enough calcium and vitamin D while you are taking this medicine, unless your doctor tells you not to. Discuss the foods you eat and the vitamins you take with your health care professional. °See your dentist regularly. Brush and floss your teeth as directed. Before you have any dental work done, tell your dentist you are receiving this medicine. °Do not become pregnant while taking this medicine or for 5 months after stopping   it. Talk with your doctor or health care professional about your birth control options while taking this medicine. Women should inform their doctor if they wish to become pregnant or think they might be pregnant. There is a potential for serious side effects to an unborn child. Talk  to your health care professional or pharmacist for more information. What side effects may I notice from receiving this medicine? Side effects that you should report to your doctor or health care professional as soon as possible: -allergic reactions like skin rash, itching or hives, swelling of the face, lips, or tongue -bone pain -breathing problems -dizziness -jaw pain, especially after dental work -redness, blistering, peeling of the skin -signs and symptoms of infection like fever or chills; cough; sore throat; pain or trouble passing urine -signs of low calcium like fast heartbeat, muscle cramps or muscle pain; pain, tingling, numbness in the hands or feet; seizures -unusual bleeding or bruising -unusually weak or tired Side effects that usually do not require medical attention (report to your doctor or health care professional if they continue or are bothersome): -constipation -diarrhea -headache -joint pain -loss of appetite -muscle pain -runny nose -tiredness -upset stomach This list may not describe all possible side effects. Call your doctor for medical advice about side effects. You may report side effects to FDA at 1-800-FDA-1088. Where should I keep my medicine? This medicine is only given in a clinic, doctor's office, or other health care setting and will not be stored at home. NOTE: This sheet is a summary. It may not cover all possible information. If you have questions about this medicine, talk to your doctor, pharmacist, or health care provider.  2018 Elsevier/Gold Standard (2016-05-13 19:17:21)

## 2018-01-13 ENCOUNTER — Ambulatory Visit (INDEPENDENT_AMBULATORY_CARE_PROVIDER_SITE_OTHER): Payer: Medicare Other | Admitting: Podiatry

## 2018-01-13 ENCOUNTER — Encounter: Payer: Self-pay | Admitting: Podiatry

## 2018-01-13 DIAGNOSIS — B351 Tinea unguium: Secondary | ICD-10-CM | POA: Diagnosis not present

## 2018-01-13 DIAGNOSIS — M79676 Pain in unspecified toe(s): Secondary | ICD-10-CM | POA: Diagnosis not present

## 2018-01-13 NOTE — Progress Notes (Signed)
Patient ID: Alexander [name], Dr., male   DOB: 1922-02-02, 84 y.o.   MRN: [id number] Complaint:  Visit Type: Patient returns to my office for continued preventative foot care services. Complaint: Patient states" my nails have grown long and thick and become painful to walk and wear shoes" . He presents for preventative foot care services. No changes to ROS  Podiatric Exam: Vascular: dorsalis pedis and posterior tibial pulses are palpable bilateral. Capillary return is immediate. Temperature gradient is WNL. Skin turgor WNL  Sensorium: Normal Semmes Weinstein monofilament test. Normal tactile sensation bilaterally. Nail Exam: Pt has thick disfigured discolored nails with subungual debris noted bilateral entire nail hallux through fifth toenails Ulcer Exam: There is no evidence of ulcer or pre-ulcerative changes or infection. Orthopedic Exam: Muscle tone and strength are WNL. No limitations in general ROM. No crepitus or effusions noted. Foot type and digits show no abnormalities. Bony prominences are unremarkable. Skin: No Porokeratosis. No infection or ulcers  Diagnosis:  Tinea unguium, Pain in right toe, pain in left toes  Treatment & Plan Procedures and Treatment: Consent by patient was obtained for treatment procedures. The patient understood the discussion of treatment and procedures well. All questions were answered thoroughly reviewed. Debridement of mycotic and hypertrophic toenails, 1 through 5 bilateral and clearing of subungual debris. No ulceration, no infection noted. ABN signed for 2019. Return Visit-Office Procedure: Patient instructed to return to the office for a follow up visit 6 weeks  for continued evaluation and treatment.   Gardiner Barefoot DPM

## 2018-01-14 DIAGNOSIS — H353211 Exudative age-related macular degeneration, right eye, with active choroidal neovascularization: Secondary | ICD-10-CM | POA: Diagnosis not present

## 2018-01-25 DIAGNOSIS — M4850XS Collapsed vertebra, not elsewhere classified, site unspecified, sequela of fracture: Secondary | ICD-10-CM | POA: Diagnosis not present

## 2018-01-25 DIAGNOSIS — R3121 Asymptomatic microscopic hematuria: Secondary | ICD-10-CM | POA: Diagnosis not present

## 2018-01-25 DIAGNOSIS — M810 Age-related osteoporosis without current pathological fracture: Secondary | ICD-10-CM | POA: Diagnosis not present

## 2018-01-25 DIAGNOSIS — R634 Abnormal weight loss: Secondary | ICD-10-CM | POA: Diagnosis not present

## 2018-01-25 DIAGNOSIS — Z0000 Encounter for general adult medical examination without abnormal findings: Secondary | ICD-10-CM | POA: Diagnosis not present

## 2018-01-25 DIAGNOSIS — Z1389 Encounter for screening for other disorder: Secondary | ICD-10-CM | POA: Diagnosis not present

## 2018-01-25 DIAGNOSIS — E038 Other specified hypothyroidism: Secondary | ICD-10-CM | POA: Diagnosis not present

## 2018-01-25 DIAGNOSIS — I2510 Atherosclerotic heart disease of native coronary artery without angina pectoris: Secondary | ICD-10-CM | POA: Diagnosis not present

## 2018-01-25 DIAGNOSIS — Z23 Encounter for immunization: Secondary | ICD-10-CM | POA: Diagnosis not present

## 2018-01-25 DIAGNOSIS — M542 Cervicalgia: Secondary | ICD-10-CM | POA: Diagnosis not present

## 2018-01-25 DIAGNOSIS — Z6820 Body mass index (BMI) 20.0-20.9, adult: Secondary | ICD-10-CM | POA: Diagnosis not present

## 2018-01-25 DIAGNOSIS — R2689 Other abnormalities of gait and mobility: Secondary | ICD-10-CM | POA: Diagnosis not present

## 2018-01-25 DIAGNOSIS — N183 Chronic kidney disease, stage 3 (moderate): Secondary | ICD-10-CM | POA: Diagnosis not present

## 2018-01-26 ENCOUNTER — Other Ambulatory Visit: Payer: Self-pay | Admitting: Oncology

## 2018-01-26 DIAGNOSIS — D473 Essential (hemorrhagic) thrombocythemia: Secondary | ICD-10-CM

## 2018-01-26 NOTE — Progress Notes (Signed)
refe

## 2018-01-29 ENCOUNTER — Ambulatory Visit (INDEPENDENT_AMBULATORY_CARE_PROVIDER_SITE_OTHER): Payer: Medicare Other

## 2018-01-29 ENCOUNTER — Encounter (INDEPENDENT_AMBULATORY_CARE_PROVIDER_SITE_OTHER): Payer: Self-pay | Admitting: Orthopaedic Surgery

## 2018-01-29 ENCOUNTER — Ambulatory Visit (INDEPENDENT_AMBULATORY_CARE_PROVIDER_SITE_OTHER): Payer: Medicare Other | Admitting: Orthopaedic Surgery

## 2018-01-29 VITALS — BP 129/57 | HR 51 | Ht 64.0 in | Wt 122.0 lb

## 2018-01-29 DIAGNOSIS — M25522 Pain in left elbow: Secondary | ICD-10-CM

## 2018-01-29 DIAGNOSIS — I25700 Atherosclerosis of coronary artery bypass graft(s), unspecified, with unstable angina pectoris: Secondary | ICD-10-CM | POA: Diagnosis not present

## 2018-01-29 DIAGNOSIS — M25551 Pain in right hip: Secondary | ICD-10-CM | POA: Diagnosis not present

## 2018-01-29 DIAGNOSIS — M25552 Pain in left hip: Secondary | ICD-10-CM

## 2018-01-29 NOTE — Progress Notes (Signed)
Office Visit Note   Patient: Alexander [name], Dr.           Date of Birth: 09/15/1921           MRN: [id number] Visit Date: 01/29/2018              Requested by: Crist Infante, MD [address] [place], [place] [address] PCP: Crist Infante, MD   Assessment & Plan: Visit Diagnoses:  1. Pain in left hip   2. Pain in right hip   3. Pain in left elbow     Plan: Contusion left shoulder, left elbow and hip.  No evidence of fracture. Follow-Up Instructions: Return if symptoms worsen or fail to improve.   Orders:  Orders Placed This Encounter  Procedures  . XR Pelvis 1-2 Views  . XR Elbow 2 Views Left  . Ambulatory referral to Physical Therapy   No orders of the defined types were placed in this encounter.     Procedures: No procedures performed   Clinical Data: No additional findings.   Subjective: Chief Complaint  Patient presents with  . 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  He fell landing directly on his left side.  He does not remember passing out or losing consciousness.  He did bruise his left elbow is been applying a Band-Aid.  He had some bleeding but notes he has not had any swelling or redness.  He also was experiencing some pain over the greater trochanter of his left hip but notes that he does not have any trouble ambulating.  He does have an issue with balance and uses a cane.  He has had some neck discomfort but realizes he had a history of arthritis and that the injury probably just "aggravated it".  Also had some discomfort with his left shoulder that seems to have resolved.  He has not had any numbness or tingling.  Not had any problems on the right side.  HPI  Review of Systems  Constitutional: Positive for fatigue.  HENT: Negative for ear pain.   Eyes: Negative  for pain.  Respiratory: Positive for shortness of breath. Negative for cough.   Cardiovascular: Negative for leg swelling.  Gastrointestinal: Positive for diarrhea. Negative for constipation.  Genitourinary: Negative for difficulty urinating.  Musculoskeletal: Positive for neck pain. Negative for back pain.  Skin: Negative for rash.  Allergic/Immunologic: Positive for food allergies.  Neurological: Positive for weakness and numbness.  Hematological: Bruises/bleeds easily.  Psychiatric/Behavioral: Negative for sleep disturbance.     Objective: Vital Signs: BP (!) 129/57 (BP Location: Left Arm, Patient Position: Sitting, Cuff Size: Normal)   Pulse (!) 51   Ht 5\' 4"  (1.626 m)   Wt 122 lb (55.3 kg)   BMI 20.94 kg/m   Physical Exam  Constitutional: He is oriented to person, place, and time. He appears well-developed and well-nourished.  HENT:  Mouth/Throat: Oropharynx is clear and moist.  Eyes: Pupils are equal, round, and reactive to light. EOM are normal.  Pulmonary/Chest: Effort normal.  Neurological: He is alert and oriented to person, place, and time.  Skin: Skin is warm and dry.  Psychiatric: He has a normal mood and affect. His behavior is normal.    Ortho Exam  awake alert and oriented x3.  Comfortable sitting.  Uses a cane for his balance.  He had some discomfort over the greater trochanter of his left hip but no pain with range of motion i.e. internal and external rotation.  No swelling distally neurovascular exam intact.  All abrasion over the left olecranon.  No drainage.  No redness.  Full range of motion of elbow.  No skin changes.  Able to place left arm fully over his head with negative impingement.  Has some mild loss of motion of the cervical spine but no pain and no referred discomfort to either upper extremity.  Neurologically intact i.e. motor and sensory to both lower extremities Specialty Comments:  No specialty comments available.  Imaging: No results  found.   PMFS History: Patient Active Problem List   Diagnosis Date Noted  . Essential thrombocythemia ([place]) 10/10/2013  . Anemia, normocytic normochromic 10/10/2013  . Anemia, chronic renal failure 10/10/2013  . Aortic valve insufficiency 06/12/2013  . CAD (coronary artery disease) of artery bypass graft 12/12/2012  . Hiatal hernia 12/12/2012  . Inguinal hernia, left 10/21/2012  . Thrombocytosis ([place]) 08/18/2012  . Moderate persistent asthma without complication 32/67/1245  . Hypertension   . Hyperlipidemia   . GERD (gastroesophageal reflux disease)   . Spinal stenosis   . Migraines   . CKD (chronic kidney disease), stage III ([place])   . BPH (benign prostatic hyperplasia)   . Heart murmur   . Calcium oxalate renal stones   . Allergic rhinitis   . Peripheral neuropathy    Past Medical History:  Diagnosis Date  . Allergic rhinitis   . Anemia   . Anemia, chronic renal failure 10/10/2013  . Anemia, normocytic normochromic 10/10/2013  . BPH (benign prostatic hyperplasia)   . Calcium oxalate renal stones   . CKD (chronic kidney disease), stage III ([place])   . Diverticulitis   . Dry senile macular degeneration   . Essential thrombocythemia ([place]) 10/10/2013  . GERD (gastroesophageal reflux disease)   . Hearing loss in left ear   . Heart murmur   . Hyperlipidemia   . Hypertension   . Migraines    OCULAR  . Peripheral neuropathy   . Renal lesion   . Spinal stenosis   . Thrombocytosis ([place]) 08/18/2012   Platelets 711,000  Hb 14, WBC 8,500 08/05/12  . Vitamin deficiency     Family History  Problem Relation Age of Onset  . Heart failure Mother   . Other Mother        Mitral Insuffiency  . Coronary artery disease Maternal Grandfather   . Heart attack Maternal Grandfather   . Other Father        Brain Meningioma  . Diabetes Father   . Heart failure Father   . Heart failure Sister   . Heart failure Maternal Grandmother   . Heart disease Maternal Grandmother   . Other Paternal  Grandmother        pnumonia  . Allergies Daughter   . Asthma Grandchild     Past Surgical History:  Procedure Laterality Date  . APPENDECTOMY  1942  . CARDIAC CATHETERIZATION  02/19/2010   EF55%, medically treated,   . CORONARY ARTERY BYPASS GRAFT  1996  . INGUINAL HERNIA REPAIR     x2  . LAPAROSCOPIC CHOLECYSTECTOMY  1992  . left renal mass ablated  12/11  . NASAL CONCHA BULLOSA RESECTION    . NM MYOCAR PERF WALL MOTION  02/14/2010   post EF  71%, Exercise cap.7METS  . TONSILLECTOMY  1937  . TRANSTHORACIC ECHOCARDIOGRAM  12/10/2011   VAPO>14%, stage1 diastolic dysfunction, mild to mod. LAE,   . TRANSTHORACIC ECHOCARDIOGRAM  07/25/2008   mild-moderate MR, mild-modTricuspid regurg.   Social History   Occupational History  . Not on file  Tobacco Use  . Smoking status: Never Smoker  . Smokeless tobacco: Never Used  Substance and Sexual Activity  . Alcohol use: No  . Drug use: No  . Sexual activity: Not on file

## 2018-02-24 ENCOUNTER — Encounter: Payer: Self-pay | Admitting: Podiatry

## 2018-02-24 ENCOUNTER — Ambulatory Visit (INDEPENDENT_AMBULATORY_CARE_PROVIDER_SITE_OTHER): Payer: Medicare Other | Admitting: Podiatry

## 2018-02-24 DIAGNOSIS — M79674 Pain in right toe(s): Secondary | ICD-10-CM | POA: Diagnosis not present

## 2018-02-24 DIAGNOSIS — B351 Tinea unguium: Secondary | ICD-10-CM | POA: Diagnosis not present

## 2018-02-24 DIAGNOSIS — M79676 Pain in unspecified toe(s): Secondary | ICD-10-CM | POA: Diagnosis not present

## 2018-02-24 DIAGNOSIS — R2689 Other abnormalities of gait and mobility: Secondary | ICD-10-CM | POA: Diagnosis not present

## 2018-02-24 NOTE — Progress Notes (Signed)
Patient ID: Alexander [name], Dr., male   DOB: 29-Feb-1922, 84 y.o.   MRN: [id number] Complaint:  Visit Type: Patient returns to my office for continued preventative foot care services. Complaint: Patient states" my nails have grown long and thick and become painful to walk and wear shoes" . He presents for preventative foot care services. No changes to ROS  Podiatric Exam: Vascular: dorsalis pedis and posterior tibial pulses are palpable bilateral. Capillary return is immediate. Temperature gradient is WNL. Skin turgor WNL  Sensorium: Normal Semmes Weinstein monofilament test. Normal tactile sensation bilaterally. Nail Exam: Pt has thick disfigured discolored nails with subungual debris noted bilateral entire nail hallux through fifth toenails Ulcer Exam: There is no evidence of ulcer or pre-ulcerative changes or infection. Orthopedic Exam: Muscle tone and strength are WNL. No limitations in general ROM. No crepitus or effusions noted. Foot type and digits show no abnormalities. Bony prominences are unremarkable. Skin: No Porokeratosis. No infection or ulcers  Diagnosis:  Tinea unguium, Pain in right toe, pain in left toes  Treatment & Plan Procedures and Treatment: Consent by patient was obtained for treatment procedures. The patient understood the discussion of treatment and procedures well. All questions were answered thoroughly reviewed. Debridement of mycotic and hypertrophic toenails, 1 through 5 bilateral and clearing of subungual debris. No ulceration, no infection noted. ABN signed for 2019. Return Visit-Office Procedure: Patient instructed to return to the office for a follow up visit 6 weeks  for continued evaluation and treatment.   Gardiner Barefoot DPM

## 2018-03-10 DIAGNOSIS — R2689 Other abnormalities of gait and mobility: Secondary | ICD-10-CM | POA: Diagnosis not present

## 2018-03-11 DIAGNOSIS — H35361 Drusen (degenerative) of macula, right eye: Secondary | ICD-10-CM | POA: Diagnosis not present

## 2018-03-11 DIAGNOSIS — H353221 Exudative age-related macular degeneration, left eye, with active choroidal neovascularization: Secondary | ICD-10-CM | POA: Diagnosis not present

## 2018-03-11 DIAGNOSIS — H353231 Exudative age-related macular degeneration, bilateral, with active choroidal neovascularization: Secondary | ICD-10-CM | POA: Diagnosis not present

## 2018-03-22 DIAGNOSIS — L570 Actinic keratosis: Secondary | ICD-10-CM | POA: Diagnosis not present

## 2018-03-22 DIAGNOSIS — L308 Other specified dermatitis: Secondary | ICD-10-CM | POA: Diagnosis not present

## 2018-04-05 ENCOUNTER — Telehealth: Payer: Self-pay | Admitting: Podiatry

## 2018-04-05 NOTE — Telephone Encounter (Signed)
Patient wanted to speak with you, not concerning appt., wanted to speak with you personally.

## 2018-04-07 DIAGNOSIS — R2689 Other abnormalities of gait and mobility: Secondary | ICD-10-CM | POA: Diagnosis not present

## 2018-04-12 DIAGNOSIS — N183 Chronic kidney disease, stage 3 (moderate): Secondary | ICD-10-CM | POA: Diagnosis not present

## 2018-04-12 DIAGNOSIS — I129 Hypertensive chronic kidney disease with stage 1 through stage 4 chronic kidney disease, or unspecified chronic kidney disease: Secondary | ICD-10-CM | POA: Diagnosis not present

## 2018-04-22 DIAGNOSIS — H353231 Exudative age-related macular degeneration, bilateral, with active choroidal neovascularization: Secondary | ICD-10-CM | POA: Diagnosis not present

## 2018-05-03 DIAGNOSIS — R2689 Other abnormalities of gait and mobility: Secondary | ICD-10-CM | POA: Diagnosis not present

## 2018-05-04 ENCOUNTER — Ambulatory Visit: Payer: Medicare Other | Admitting: Podiatry

## 2018-05-11 ENCOUNTER — Observation Stay (HOSPITAL_COMMUNITY)
Admission: EM | Admit: 2018-05-11 | Discharge: 2018-05-13 | Disposition: A | Discharge status: Skilled Nursing Facility | Payer: Medicare Other | Attending: Internal Medicine | Admitting: Internal Medicine

## 2018-05-11 ENCOUNTER — Other Ambulatory Visit: Payer: Self-pay

## 2018-05-11 ENCOUNTER — Encounter (HOSPITAL_COMMUNITY): Payer: Self-pay

## 2018-05-11 DIAGNOSIS — Z79890 Hormone replacement therapy (postmenopausal): Secondary | ICD-10-CM | POA: Diagnosis not present

## 2018-05-11 DIAGNOSIS — E039 Hypothyroidism, unspecified: Secondary | ICD-10-CM | POA: Insufficient documentation

## 2018-05-11 DIAGNOSIS — S32591A Other specified fracture of right pubis, initial encounter for closed fracture: Secondary | ICD-10-CM

## 2018-05-11 DIAGNOSIS — G629 Polyneuropathy, unspecified: Secondary | ICD-10-CM | POA: Diagnosis not present

## 2018-05-11 DIAGNOSIS — S32474A Nondisplaced fracture of medial wall of right acetabulum, initial encounter for closed fracture: Secondary | ICD-10-CM

## 2018-05-11 DIAGNOSIS — S32401A Unspecified fracture of right acetabulum, initial encounter for closed fracture: Secondary | ICD-10-CM | POA: Diagnosis not present

## 2018-05-11 DIAGNOSIS — R7989 Other specified abnormal findings of blood chemistry: Secondary | ICD-10-CM | POA: Diagnosis not present

## 2018-05-11 DIAGNOSIS — N183 Chronic kidney disease, stage 3 unspecified: Secondary | ICD-10-CM | POA: Diagnosis present

## 2018-05-11 DIAGNOSIS — I10 Essential (primary) hypertension: Secondary | ICD-10-CM | POA: Diagnosis present

## 2018-05-11 DIAGNOSIS — D631 Anemia in chronic kidney disease: Secondary | ICD-10-CM | POA: Diagnosis not present

## 2018-05-11 DIAGNOSIS — W19XXXA Unspecified fall, initial encounter: Secondary | ICD-10-CM

## 2018-05-11 DIAGNOSIS — I352 Nonrheumatic aortic (valve) stenosis with insufficiency: Secondary | ICD-10-CM | POA: Insufficient documentation

## 2018-05-11 DIAGNOSIS — Z7982 Long term (current) use of aspirin: Secondary | ICD-10-CM | POA: Diagnosis not present

## 2018-05-11 DIAGNOSIS — R52 Pain, unspecified: Secondary | ICD-10-CM | POA: Diagnosis not present

## 2018-05-11 DIAGNOSIS — I351 Nonrheumatic aortic (valve) insufficiency: Secondary | ICD-10-CM | POA: Diagnosis present

## 2018-05-11 DIAGNOSIS — Z79899 Other long term (current) drug therapy: Secondary | ICD-10-CM | POA: Diagnosis not present

## 2018-05-11 DIAGNOSIS — K219 Gastro-esophageal reflux disease without esophagitis: Secondary | ICD-10-CM | POA: Diagnosis present

## 2018-05-11 DIAGNOSIS — M25572 Pain in left ankle and joints of left foot: Secondary | ICD-10-CM | POA: Diagnosis not present

## 2018-05-11 DIAGNOSIS — R609 Edema, unspecified: Secondary | ICD-10-CM | POA: Insufficient documentation

## 2018-05-11 DIAGNOSIS — I129 Hypertensive chronic kidney disease with stage 1 through stage 4 chronic kidney disease, or unspecified chronic kidney disease: Secondary | ICD-10-CM | POA: Diagnosis not present

## 2018-05-11 DIAGNOSIS — M79651 Pain in right thigh: Secondary | ICD-10-CM | POA: Diagnosis not present

## 2018-05-11 DIAGNOSIS — R0602 Shortness of breath: Secondary | ICD-10-CM | POA: Diagnosis not present

## 2018-05-11 DIAGNOSIS — W010XXA Fall on same level from slipping, tripping and stumbling without subsequent striking against object, initial encounter: Secondary | ICD-10-CM | POA: Diagnosis not present

## 2018-05-11 DIAGNOSIS — S32409A Unspecified fracture of unspecified acetabulum, initial encounter for closed fracture: Secondary | ICD-10-CM | POA: Diagnosis present

## 2018-05-11 DIAGNOSIS — S79921A Unspecified injury of right thigh, initial encounter: Secondary | ICD-10-CM | POA: Diagnosis not present

## 2018-05-11 DIAGNOSIS — E785 Hyperlipidemia, unspecified: Secondary | ICD-10-CM | POA: Diagnosis present

## 2018-05-11 DIAGNOSIS — D473 Essential (hemorrhagic) thrombocythemia: Secondary | ICD-10-CM | POA: Insufficient documentation

## 2018-05-11 DIAGNOSIS — Y92009 Unspecified place in unspecified non-institutional (private) residence as the place of occurrence of the external cause: Secondary | ICD-10-CM

## 2018-05-11 DIAGNOSIS — M25551 Pain in right hip: Secondary | ICD-10-CM | POA: Diagnosis not present

## 2018-05-11 NOTE — ED Triage Notes (Addendum)
Pt was brought in by [facility] from home due to an unwitnessed fall. Per EMS, pt c/o pain in right hip during ROM. Per EMS, no deformity noted. Pt denies LOC, taking blood thinners, or hitting head.

## 2018-05-12 ENCOUNTER — Encounter (HOSPITAL_COMMUNITY): Payer: Self-pay | Admitting: Internal Medicine

## 2018-05-12 ENCOUNTER — Emergency Department (HOSPITAL_COMMUNITY): Payer: Medicare Other

## 2018-05-12 DIAGNOSIS — N183 Chronic kidney disease, stage 3 (moderate): Secondary | ICD-10-CM

## 2018-05-12 DIAGNOSIS — S32401A Unspecified fracture of right acetabulum, initial encounter for closed fracture: Secondary | ICD-10-CM

## 2018-05-12 DIAGNOSIS — K219 Gastro-esophageal reflux disease without esophagitis: Secondary | ICD-10-CM

## 2018-05-12 DIAGNOSIS — S32474A Nondisplaced fracture of medial wall of right acetabulum, initial encounter for closed fracture: Secondary | ICD-10-CM

## 2018-05-12 DIAGNOSIS — S32409A Unspecified fracture of unspecified acetabulum, initial encounter for closed fracture: Secondary | ICD-10-CM | POA: Diagnosis present

## 2018-05-12 DIAGNOSIS — Y92009 Unspecified place in unspecified non-institutional (private) residence as the place of occurrence of the external cause: Secondary | ICD-10-CM | POA: Diagnosis not present

## 2018-05-12 DIAGNOSIS — M25551 Pain in right hip: Secondary | ICD-10-CM | POA: Diagnosis not present

## 2018-05-12 DIAGNOSIS — W19XXXA Unspecified fall, initial encounter: Secondary | ICD-10-CM | POA: Diagnosis not present

## 2018-05-12 DIAGNOSIS — R0602 Shortness of breath: Secondary | ICD-10-CM | POA: Diagnosis not present

## 2018-05-12 DIAGNOSIS — S79921A Unspecified injury of right thigh, initial encounter: Secondary | ICD-10-CM | POA: Diagnosis not present

## 2018-05-12 DIAGNOSIS — M79651 Pain in right thigh: Secondary | ICD-10-CM | POA: Diagnosis not present

## 2018-05-12 DIAGNOSIS — S32591A Other specified fracture of right pubis, initial encounter for closed fracture: Secondary | ICD-10-CM | POA: Diagnosis not present

## 2018-05-12 LAB — CBC WITH DIFFERENTIAL/PLATELET
Abs Immature Granulocytes: 0.1 10*3/uL — ABNORMAL HIGH (ref 0.00–0.07)
Basophils Absolute: 0.1 10*3/uL (ref 0.0–0.1)
Basophils Relative: 1 %
EOS PCT: 0 %
Eosinophils Absolute: 0 10*3/uL (ref 0.0–0.5)
HCT: 36.1 % — ABNORMAL LOW (ref 39.0–52.0)
Hemoglobin: 11.7 g/dL — ABNORMAL LOW (ref 13.0–17.0)
Immature Granulocytes: 1 %
Lymphocytes Relative: 10 %
Lymphs Abs: 1 10*3/uL (ref 0.7–4.0)
MCH: 40.3 pg — ABNORMAL HIGH (ref 26.0–34.0)
MCHC: 32.4 g/dL (ref 30.0–36.0)
MCV: 124.5 fL — ABNORMAL HIGH (ref 80.0–100.0)
MONO ABS: 0.9 10*3/uL (ref 0.1–1.0)
Monocytes Relative: 9 %
Neutro Abs: 8 10*3/uL — ABNORMAL HIGH (ref 1.7–7.7)
Neutrophils Relative %: 79 %
Platelets: 324 10*3/uL (ref 150–400)
RBC: 2.9 MIL/uL — ABNORMAL LOW (ref 4.22–5.81)
RDW: 12.8 % (ref 11.5–15.5)
WBC: 10 10*3/uL (ref 4.0–10.5)
nRBC: 0 % (ref 0.0–0.2)

## 2018-05-12 LAB — BASIC METABOLIC PANEL
Anion gap: 9 (ref 5–15)
BUN: 30 mg/dL — AB (ref 8–23)
CO2: 25 mmol/L (ref 22–32)
Calcium: 8.6 mg/dL — ABNORMAL LOW (ref 8.9–10.3)
Chloride: 108 mmol/L (ref 98–111)
Creatinine, Ser: 1.7 mg/dL — ABNORMAL HIGH (ref 0.61–1.24)
GFR calc Af Amer: 39 mL/min — ABNORMAL LOW (ref >60)
GFR calc non Af Amer: 34 mL/min — ABNORMAL LOW (ref >60)
Glucose, Bld: 131 mg/dL — ABNORMAL HIGH (ref 70–99)
Potassium: 4.3 mmol/L (ref 3.5–5.1)
Sodium: 142 mmol/L (ref 135–145)

## 2018-05-12 LAB — URINALYSIS, ROUTINE W REFLEX MICROSCOPIC
Bacteria, UA: NONE SEEN
Bilirubin Urine: NEGATIVE
Glucose, UA: NEGATIVE mg/dL
Hgb urine dipstick: NEGATIVE
Ketones, ur: NEGATIVE mg/dL
Leukocytes, UA: NEGATIVE
Nitrite: NEGATIVE
Protein, ur: 30 mg/dL — AB
Specific Gravity, Urine: 1.02 (ref 1.005–1.030)
pH: 6 (ref 5.0–8.0)

## 2018-05-12 LAB — TSH: TSH: 0.946 u[IU]/mL (ref 0.350–4.500)

## 2018-05-12 MED ORDER — TEMAZEPAM 7.5 MG PO CAPS: 7.5000 mg | ORAL_CAPSULE | Freq: Every evening | ORAL | Status: DC | PRN

## 2018-05-12 MED ORDER — METOPROLOL TARTRATE 25 MG PO TABS
25.0000 mg | ORAL_TABLET | Freq: Every day | ORAL | Status: DC
  Administered 2018-05-12 – 2018-05-13 (×2): 25 mg via ORAL
  Filled 2018-05-12 (×2): qty 1

## 2018-05-12 MED ORDER — CO Q-10 300 MG PO CAPS: 1.0000 | ORAL_CAPSULE | Freq: Every day | ORAL | Status: DC

## 2018-05-12 MED ORDER — METHOCARBAMOL 500 MG PO TABS
500.0000 mg | ORAL_TABLET | Freq: Once | ORAL | Status: AC
  Administered 2018-05-12: 500 mg via ORAL
  Filled 2018-05-12: qty 1

## 2018-05-12 MED ORDER — HEPARIN SODIUM (PORCINE) 5000 UNIT/ML IJ SOLN
5000.0000 [IU] | Freq: Three times a day (TID) | INTRAMUSCULAR | Status: DC
  Administered 2018-05-12 – 2018-05-13 (×2): 5000 [IU] via SUBCUTANEOUS
  Filled 2018-05-12 (×2): qty 1

## 2018-05-12 MED ORDER — EZETIMIBE 10 MG PO TABS
10.0000 mg | ORAL_TABLET | Freq: Every day | ORAL | Status: DC
  Administered 2018-05-12 – 2018-05-13 (×2): 10 mg via ORAL
  Filled 2018-05-12 (×2): qty 1

## 2018-05-12 MED ORDER — ADULT MULTIVITAMIN W/MINERALS CH
1.0000 | ORAL_TABLET | Freq: Every day | ORAL | Status: DC
  Administered 2018-05-12 – 2018-05-13 (×2): 1 via ORAL
  Filled 2018-05-12: qty 1

## 2018-05-12 MED ORDER — PROSIGHT PO TABS
ORAL_TABLET | Freq: Two times a day (BID) | ORAL | Status: DC
  Administered 2018-05-12 – 2018-05-13 (×2): 1 via ORAL
  Filled 2018-05-12 (×3): qty 1

## 2018-05-12 MED ORDER — HYDROXYUREA 500 MG PO CAPS
500.0000 mg | ORAL_CAPSULE | Freq: Every day | ORAL | Status: DC
  Administered 2018-05-12 – 2018-05-13 (×2): 500 mg via ORAL
  Filled 2018-05-12 (×2): qty 1

## 2018-05-12 MED ORDER — CYCLOBENZAPRINE HCL 10 MG PO TABS
5.0000 mg | ORAL_TABLET | Freq: Once | ORAL | Status: AC
  Administered 2018-05-12: 5 mg via ORAL
  Filled 2018-05-12: qty 1

## 2018-05-12 MED ORDER — ASPIRIN EC 81 MG PO TBEC
81.0000 mg | DELAYED_RELEASE_TABLET | Freq: Every day | ORAL | Status: DC
  Administered 2018-05-12 – 2018-05-13 (×2): 81 mg via ORAL
  Filled 2018-05-12 (×2): qty 1

## 2018-05-12 MED ORDER — LUTEIN 6 MG PO CAPS: 6.0000 mg | ORAL_CAPSULE | Freq: Every day | ORAL | Status: DC

## 2018-05-12 MED ORDER — FAMOTIDINE 20 MG PO TABS
10.0000 mg | ORAL_TABLET | Freq: Every day | ORAL | Status: DC
  Administered 2018-05-12 – 2018-05-13 (×2): 10 mg via ORAL
  Filled 2018-05-12 (×2): qty 1

## 2018-05-12 MED ORDER — ACETAMINOPHEN 650 MG RE SUPP: 650.0000 mg | Freq: Four times a day (QID) | RECTAL | Status: DC | PRN

## 2018-05-12 MED ORDER — FLUTICASONE PROPIONATE 50 MCG/ACT NA SUSP: 1.0000 | Freq: Every day | NASAL | Status: DC | PRN

## 2018-05-12 MED ORDER — FINASTERIDE 5 MG PO TABS
5.0000 mg | ORAL_TABLET | ORAL | Status: DC
  Administered 2018-05-12: 5 mg via ORAL
  Filled 2018-05-12: qty 1

## 2018-05-12 MED ORDER — DENOSUMAB 60 MG/ML ~~LOC~~ SOLN: 60.0000 mg | SUBCUTANEOUS | Status: DC

## 2018-05-12 MED ORDER — LEVOTHYROXINE SODIUM 25 MCG PO TABS
25.0000 ug | ORAL_TABLET | Freq: Every day | ORAL | Status: DC
  Administered 2018-05-12 – 2018-05-13 (×2): 25 ug via ORAL
  Filled 2018-05-12 (×2): qty 1

## 2018-05-12 MED ORDER — BISACODYL 5 MG PO TBEC: 5.0000 mg | DELAYED_RELEASE_TABLET | Freq: Every day | ORAL | Status: DC | PRN

## 2018-05-12 MED ORDER — ACETAMINOPHEN 325 MG PO TABS: 650.0000 mg | ORAL_TABLET | Freq: Four times a day (QID) | ORAL | Status: DC | PRN

## 2018-05-12 MED ORDER — ONDANSETRON HCL 4 MG/2ML IJ SOLN: 4.0000 mg | Freq: Four times a day (QID) | INTRAMUSCULAR | Status: DC | PRN

## 2018-05-12 MED ORDER — ALUM & MAG HYDROXIDE-SIMETH 200-200-20 MG/5ML PO SUSP: 15.0000 mL | Freq: Four times a day (QID) | ORAL | Status: DC | PRN

## 2018-05-12 MED ORDER — VITAMIN D 25 MCG (1000 UNIT) PO TABS
2000.0000 [IU] | ORAL_TABLET | Freq: Every day | ORAL | Status: DC
  Administered 2018-05-12 – 2018-05-13 (×2): 2000 [IU] via ORAL
  Filled 2018-05-12 (×2): qty 2

## 2018-05-12 MED ORDER — MELATONIN 5 MG PO TABS: 5.0000 mg | ORAL_TABLET | Freq: Every evening | ORAL | Status: DC | PRN

## 2018-05-12 MED ORDER — FENTANYL CITRATE (PF) 100 MCG/2ML IJ SOLN
12.5000 ug | INTRAMUSCULAR | Status: DC | PRN
  Administered 2018-05-12 (×4): 12.5 ug via INTRAVENOUS
  Filled 2018-05-12 (×4): qty 2

## 2018-05-12 MED ORDER — ONDANSETRON HCL 4 MG PO TABS: 4.0000 mg | ORAL_TABLET | Freq: Four times a day (QID) | ORAL | Status: DC | PRN

## 2018-05-12 MED ORDER — ACETAMINOPHEN 325 MG PO TABS
650.0000 mg | ORAL_TABLET | Freq: Once | ORAL | Status: AC
  Administered 2018-05-12: 650 mg via ORAL
  Filled 2018-05-12: qty 2

## 2018-05-12 MED ORDER — SENNOSIDES-DOCUSATE SODIUM 8.6-50 MG PO TABS: 1.0000 | ORAL_TABLET | Freq: Every evening | ORAL | Status: DC | PRN

## 2018-05-12 NOTE — ED Provider Notes (Signed)
I assumed care of this patient from Dr. Florina Ou at 7 am.  Please see their note for further details of Hx, PE.  Briefly patient is a 85 y.o. male who presented with fall.    Patient with acute nondisplaced fracture of the right pubo-acetabular junction and mid inferior pubic ramus.  Ortho was consulted and this will be nonoperative.  Weightbearing as tolerated.  Patient was signed out awaiting social work consultation for possible placement.  Social work is unable to place the patient from the ED.  Pain was being controlled with IV fentanyl.  Lab work was obtained that was overall unremarkable.  Patient admitted to hospitalist service for further pain control likely skilled nursing facility placement as needed.  Home medications were ordered.  Social work will continue to work with placement for the patient.  They are aware of likely need to pay out-of-pocket.  Chest x-ray showed no signs of pneumonia, pneumothorax, pleural effusion.  EKG unremarkable.  EKG shows sinus rhythm, no signs of ischemic changes.  Patient admitted to hospitalist for further care.  This chart was dictated using voice recognition software.  Despite best efforts to proofread,  errors can occur which can change the documentation meaning.    Lennice Sites, DO 05/12/18 1438

## 2018-05-12 NOTE — ED Provider Notes (Signed)
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  He is having pain in his right greater trochanter which he rates as a "nickel to a dime" in severity.  Pain is worse with movement or attempted weightbearing.  He denies other injury.  He is not on anticoagulation.  He did not hit his head.  There was no loss of consciousness.   Past Medical History:  Diagnosis Date  . Allergic rhinitis   . Anemia   . Anemia, chronic renal failure 10/10/2013  . Anemia, normocytic normochromic 10/10/2013  . BPH (benign prostatic hyperplasia)   . Calcium oxalate renal stones   . CKD (chronic kidney disease), stage III ([place])   . Diverticulitis   . Dry senile macular degeneration   . Essential thrombocythemia ([place]) 10/10/2013  . GERD (gastroesophageal reflux disease)   . Hearing loss in left ear   . Heart murmur   . Hyperlipidemia   . Hypertension   . Migraines    OCULAR  . Peripheral neuropathy   . Renal lesion   . Spinal stenosis   . Thrombocytosis ([place]) 08/18/2012   Platelets 711,000  Hb 14, WBC 8,500 08/05/12  . Vitamin deficiency     Past Surgical History:  Procedure Laterality Date  . APPENDECTOMY  1942  . CARDIAC CATHETERIZATION  02/19/2010   EF55%, medically treated,   . CORONARY ARTERY BYPASS GRAFT  1996  . INGUINAL HERNIA REPAIR     x2  . LAPAROSCOPIC CHOLECYSTECTOMY  1992  . left renal mass ablated  12/11  . NASAL CONCHA BULLOSA RESECTION    . NM MYOCAR PERF WALL MOTION  02/14/2010   post EF 71%, Exercise cap.7METS  . TONSILLECTOMY  1937  . TRANSTHORACIC ECHOCARDIOGRAM  12/10/2011   RSWN>46%, stage1 diastolic dysfunction, mild to mod. LAE,   . TRANSTHORACIC ECHOCARDIOGRAM  07/25/2008     mild-moderate MR, mild-modTricuspid regurg.    Family History  Problem Relation Age of Onset  . Heart failure Mother   . Other Mother        Mitral Insuffiency  . Coronary artery disease Maternal Grandfather   . Heart attack Maternal Grandfather   . Other Father        Brain Meningioma  . Diabetes Father   . Heart failure Father   . Heart failure Sister   . Heart failure Maternal Grandmother   . Heart disease Maternal Grandmother   . Other Paternal Grandmother        pnumonia  . Allergies Daughter   . Asthma Grandchild     Social History   Tobacco Use  . Smoking status: Never Smoker  . Smokeless tobacco: Never Used  Substance Use Topics  . Alcohol use: No  . Drug use: No    Prior to Admission medications   Medication Sig Start Date End Date Taking? Authorizing Provider  aspirin 81 MG tablet Take 81 mg by mouth daily.     Yes [provider]  cholecalciferol (VITAMIN D) 1000 UNITS tablet Take 2,000 Units by mouth daily.    Yes [provider]  Coenzyme Q10 (CO Q-10) 300 MG CAPS Take 1 capsule by mouth daily.  Yes [provider]  Cyanocobalamin (VITAMIN B-12 IJ) Inject as directed. monthly    Yes [provider]  denosumab (PROLIA) 60 MG/ML SOLN injection Inject 60 mg into the skin every 6 (six) months. Administer in upper arm, thigh, or abdomen   Yes [provider]  ezetimibe (ZETIA) 10 MG tablet Take 10 mg by mouth daily.   Yes [provider]  finasteride (PROSCAR) 5 MG tablet Take 1 tablet by mouth 3 (three) times a week. TIW   Yes [provider]  fluticasone (FLONASE) 50 MCG/ACT nasal spray Place 1 spray into both nostrils daily as needed for allergies.  10/08/17  Yes [provider]  hydroxyurea (HYDREA) 500 MG capsule TAKE 1 CAPSULE BY MOUTH  DAILY. MAY TAKE WITH FOOD  TO MINIMIZE GI SIDE  EFFECTS. Patient taking differently: Take 500 mg by mouth daily.  10/13/17  Yes Annia Belt, MD   levothyroxine (SYNTHROID, LEVOTHROID) 25 MCG tablet Take 25 mcg by mouth daily before breakfast.   Yes [provider]  LUTEIN PO Take 6 mg by mouth daily.    Yes [provider]  Melatonin 1 MG TABS Take 1.5 mg by mouth at bedtime as needed (sleep).   Yes [provider]  metoprolol tartrate (LOPRESSOR) 25 MG tablet Take 0.5 tablets (12.5 mg total) by mouth 2 (two) times daily. Patient taking differently: Take 25 mg by mouth daily.  01/19/17  Yes Troy Sine, MD  Multiple Vitamin (MULTIVITAMIN) tablet Take 1 tablet by mouth daily.     Yes [provider]  Multiple Vitamins-Minerals (PRESERVISION AREDS PO) Take 1 capsule by mouth 2 (two) times daily.     Yes [provider]  ranitidine (ZANTAC) 150 MG capsule Take 150 mg by mouth daily.   Yes [provider]  temazepam (RESTORIL) 7.5 MG capsule Take 1 capsule (7.5 mg total) by mouth at bedtime as needed for sleep. 10/15/17  Yes Troy Sine, MD  aluminum & magnesium hydroxide-simethicone (MYLANTA) 500-450-40 MG/5ML suspension Take by mouth every 6 (six) hours as needed. 200-200-20mg /65ml     [provider]    Allergies Pollen extract   REVIEW OF SYSTEMS  Negative except as noted here or in the History of Present Illness.   PHYSICAL EXAMINATION  Initial Vital Signs Blood pressure (!) 146/76, pulse 71, temperature 98.1 F (36.7 C), temperature source Oral, resp. rate 20, height 5\' 5"  (1.651 m), weight 56.2 kg, SpO2 99 %.  Examination General: Well-developed, well-nourished male in no acute distress; appearance consistent with age of record HENT: normocephalic; atraumatic Eyes: pupils equal, round and reactive to light; extraocular muscles intact; bilateral pseudophakia Neck: supple; nontender Heart: regular rate and rhythm Lungs: clear to auscultation bilaterally Abdomen: soft; nondistended; nontender; bowel sounds present Extremities: Arthritic changes; no acute  deformity; pain in right greater trochanter on passive range of motion and palpation; trace edema of lower legs Neurologic: Awake, alert and oriented; motor function intact in all extremities and symmetric; no facial droop Skin: Warm and dry Psychiatric: Normal mood and affect   RESULTS  Summary of this visit's results, reviewed by myself:   EKG Interpretation  Date/Time:    Ventricular Rate:    PR Interval:    QRS Duration:   QT Interval:    QTC Calculation:   R Axis:     Text Interpretation:        Laboratory Studies: No results found for this or any previous visit (from the past 24  hour(s)). Imaging Studies: Ct Femur Right Wo Contrast  Result Date: 05/12/2018 CLINICAL DATA:  Patient fell and is unable to bear weight. Pain in the upper to mid thigh. EXAM: CT OF THE LOWER RIGHT EXTREMITY WITHOUT CONTRAST TECHNIQUE: Multidetector CT imaging of the right lower extremity was performed according to the standard protocol. COMPARISON:  Same day radiographs of the pelvis and right hip. FINDINGS: Bones/Joint/Cartilage There are acute nondisplaced fractures of the right puboacetabular junction and mid inferior pubic ramus, series 3/40 and 68. Additionally, subtle fracture lucency involving the medial wall of the acetabulum extending to the roof of the acetabulum is suggested without significant displacement nor protrusio deformity. Slightly aspherical appearance of the femoral head-neck junction consistent with femoroacetabular impingement morphology. Ligaments Suboptimally assessed by CT. Muscles and Tendons Intramuscular thickening and swelling of the right obturator internus muscle is identified likely a result of adjacent fractures. No active hemorrhage is noted. Soft tissues Extensive circular muscle hypertrophy and sigmoid diverticulosis is noted. The included bladder is decompressed and nondistended. Normal size prostate. IMPRESSION: 1. Acute nondisplaced fractures of the right  puboacetabular junction and mid inferior pubic ramus. 2. Subtle fracture lucency involving the medial wall of the acetabulum extending to the roof of the acetabulum is also noted without significant displacement nor protrusio deformity. 3. 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 No proximal femoral fracture is noted. 4. Extensive circular muscle hypertrophy and diverticulosis of the sigmoid colon. Electronically Signed   By: Ashley Royalty M.D.   On: 05/12/2018 02:09   Dg Hip Unilat W Or Wo Pelvis 2-3 Views Right  Result Date: 05/12/2018 CLINICAL DATA:  Fall at home tonight with right hip pain. EXAM: DG HIP (WITH OR WITHOUT PELVIS) 2-3V RIGHT COMPARISON:  None. FINDINGS: The cortical margins of the bony pelvis and right hip are intact. No fracture. Pubic symphysis and sacroiliac joints are congruent. Both femoral heads are well-seated in the respective acetabula. Age related acetabular spurring of both hips, bilateral hip joint space narrowing is greater on the left. IMPRESSION: No fracture or subluxation of the pelvis or right hip. Electronically Signed   By: Keith Rake M.D.   On: 05/12/2018 00:53   Dg Femur Min 2 Views Right  Result Date: 05/12/2018 CLINICAL DATA:  Patient fell this evening landing on right hip. Lateral hip pain radiating down the leg. EXAM: RIGHT FEMUR 2 VIEWS COMPARISON:  None. FINDINGS: Overlapping AP and lateral views of the right femur are provided. The proximal femur including the femoral head, neck and trochanters are included as part of the same day right hip radiographs. No acute fracture of the included femoral diaphysis, femoral condyles as well as proximal tibia and fibula. Vascular clips are noted along the medial aspect of the thigh, knee and proximal calf. No significant joint space narrowing is noted. Femoral arteriosclerosis is seen.  IMPRESSION: No acute osseous abnormality. Electronically Signed   By: Ashley Royalty M.D.   On: 05/12/2018 00:52    ED COURSE and MDM  Nursing notes and initial vitals signs, including pulse oximetry, reviewed.  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  We will obtain a CT of his right femur.  2:50 AM CT shows nondisplaced right acetabular fractures.  Discussed with Dr. Rodell Perna of [facility] orthopedics.  He advises patient to be ambulatory with a walker to minimize use of the right leg.  We will have social work consult to arrange for possible transfer to rehab facility or to arrange home health care later this morning.  This was discussed with the patient's son, [name], as well.  PROCEDURES    ED DIAGNOSES     ICD-10-CM   1. Fall in home, initial encounter W19.XXXA    Y92.009   2. Closed nondisplaced fracture of right acetabulum, unspecified portion of acetabulum, initial encounter ([place]) S32.401A   3. Closed nondisplaced fracture of medial wall of right acetabulum, initial encounter ([place]) S32.474A   4. Closed fracture of right inferior pubic ramus, initial encounter ([place]) S32.591A        Shanon Rosser, MD 05/12/18 [phone]

## 2018-05-12 NOTE — H&P (Addendum)
History and Physical    Alexander [name], Dr. 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  In the ER patient was noted to have an acetabular fx and  had difficulty with ambulation and weightbearing, he was unable to be discharged home due to this.  He was consulted orthopedics who felt was nonoperative and weightbearing as tolerated.  Case management was involved in the ER to try to facilitate placement but they were unable to place the patient at the facility of choice.  Because of his inability to ambulate and poor pain control he was consulted to the hospital service for further eval and treatment.  ED Course: In the ER patient was afebrile vital signs were stable.  BMP was notable for BUN and creatinine of 30 and 1.7 which is patient's baseline.  White count normal hemoglobin 11.7.  CT scan was notable for acute nondisplaced fracture of the right acetabular junction and mild inferior pubic ramus.  Patient was unable to ambulate in the ER and since family is pursuing rehab.  Discussion took place regarding observation status they are aware.  Review of Systems: As per HPI otherwise 10 point review of systems negative.  [place] Past Medical History:  Diagnosis Date  . Allergic rhinitis   . Anemia   . Anemia, chronic renal failure 10/10/2013  . Anemia, normocytic normochromic 10/10/2013  . BPH (benign prostatic hyperplasia)   . Calcium oxalate renal stones     . CKD (chronic kidney disease), stage III ([place])   . Diverticulitis   . Dry senile macular degeneration   . Essential thrombocythemia ([place]) 10/10/2013  . GERD (gastroesophageal reflux disease)   . Hearing loss in left ear   . Heart murmur   . Hyperlipidemia   . Hypertension   . Migraines    OCULAR  . Peripheral neuropathy   . Renal lesion   . Spinal stenosis   . Thrombocytosis ([place]) 08/18/2012   Platelets 711,000  Hb 14, WBC 8,500 08/05/12  . Vitamin deficiency     Past Surgical History:  Procedure Laterality Date  . APPENDECTOMY  1942  . CARDIAC CATHETERIZATION  02/19/2010   EF55%, medically treated,   . CORONARY ARTERY BYPASS GRAFT  1996  . INGUINAL HERNIA REPAIR     x2  . LAPAROSCOPIC CHOLECYSTECTOMY  1992  . left renal mass ablated  12/11  . NASAL CONCHA BULLOSA RESECTION    . NM MYOCAR PERF WALL MOTION  02/14/2010   post EF 71%, Exercise cap.7METS  . TONSILLECTOMY  1937  . TRANSTHORACIC ECHOCARDIOGRAM  12/10/2011   ZHYQ>65%, stage1 diastolic dysfunction, mild to mod. LAE,   . TRANSTHORACIC ECHOCARDIOGRAM  07/25/2008   mild-moderate MR, mild-modTricuspid regurg.     reports that he has never smoked. He has never used smokeless tobacco. He reports that he does not drink alcohol or use drugs.  Allergies  Allergen Reactions  . Pollen Extract  ragweed    Family History  Problem Relation Age of Onset  . Heart failure Mother   . Other Mother        Mitral Insuffiency  . Coronary artery disease Maternal Grandfather   . Heart attack Maternal Grandfather   . Other Father        Brain Meningioma  . Diabetes Father   . Heart failure Father   . Heart failure Sister   . Heart failure Maternal Grandmother   . Heart disease Maternal Grandmother   . Other Paternal Grandmother        pnumonia  . Allergies Daughter   . Asthma Grandchild   Is a Prior to Admission medications   Medication Sig Start Date End Date Taking? Authorizing Provider  aspirin 81 MG tablet  Take 81 mg by mouth daily.     Yes [provider]  cholecalciferol (VITAMIN D) 1000 UNITS tablet Take 2,000 Units by mouth daily.    Yes [provider]  Coenzyme Q10 (CO Q-10) 300 MG CAPS Take 1 capsule by mouth daily.   Yes [provider]  Cyanocobalamin (VITAMIN B-12 IJ) Inject as directed. monthly    Yes [provider]  denosumab (PROLIA) 60 MG/ML SOLN injection Inject 60 mg into the skin every 6 (six) months. Administer in upper arm, thigh, or abdomen   Yes [provider]  ezetimibe (ZETIA) 10 MG tablet Take 10 mg by mouth daily.   Yes [provider]  finasteride (PROSCAR) 5 MG tablet Take 1 tablet by mouth 3 (three) times a week. TIW   Yes [provider]  fluticasone (FLONASE) 50 MCG/ACT nasal spray Place 1 spray into both nostrils daily as needed for allergies.  10/08/17  Yes [provider]  hydroxyurea (HYDREA) 500 MG capsule TAKE 1 CAPSULE BY MOUTH  DAILY. MAY TAKE WITH FOOD  TO MINIMIZE GI SIDE  EFFECTS. Patient taking differently: Take 500 mg by mouth daily.  10/13/17  Yes Annia Belt, MD  levothyroxine (SYNTHROID, LEVOTHROID) 25 MCG tablet Take 25 mcg by mouth daily before breakfast.   Yes [provider]  LUTEIN PO Take 6 mg by mouth daily.    Yes [provider]  Melatonin 1 MG TABS Take 1.5 mg by mouth at bedtime as needed (sleep).   Yes [provider]  metoprolol tartrate (LOPRESSOR) 25 MG tablet Take 0.5 tablets (12.5 mg total) by mouth 2 (two) times daily. Patient taking differently: Take 25 mg by mouth daily.  01/19/17  Yes Troy Sine, MD  Multiple Vitamin (MULTIVITAMIN) tablet Take 1 tablet by mouth daily.     Yes [provider]  Multiple Vitamins-Minerals (PRESERVISION AREDS PO) Take 1 capsule by mouth 2 (two) times daily.     Yes [provider]  ranitidine (ZANTAC) 150 MG capsule Take 150 mg by mouth daily.   Yes [provider]   temazepam (RESTORIL) 7.5 MG capsule Take 1 capsule (7.5 mg total) by mouth at bedtime as needed for sleep. 10/15/17  Yes Troy Sine, MD  aluminum & magnesium hydroxide-simethicone (MYLANTA) 500-450-40 MG/5ML suspension Take by mouth every 6 (six) hours as needed. 200-200-20mg /80ml     [provider]    Physical Exam: Vitals:   05/12/18 1000 05/12/18 1100 05/12/18 1122 05/12/18 1415  BP: (!) 145/69 (!) 145/64 (!) 145/64 (!) 145/63  Pulse: 72 74 72 69  Resp:   20 18  Temp:      TempSrc:  SpO2: 94% 96% 93% 93%  Weight:      Height:        Constitutional: NAD, calm, comfortable Vitals:   05/12/18 1000 05/12/18 1100 05/12/18 1122 05/12/18 1415  BP: (!) 145/69 (!) 145/64 (!) 145/64 (!) 145/63  Pulse: 72 74 72 69  Resp:   20 18  Temp:      TempSrc:      SpO2: 94% 96% 93% 93%  Weight:      Height:       Eyes: PERRL, lids and conjunctivae normal ENMT: Mucous membranes are moist. Posterior pharynx clear of any exudate or lesions.Normal dentition.  Neck: normal, supple, no masses, no thyromegaly Respiratory: clear to auscultation bilaterally, no wheezing, no crackles. Normal respiratory effort. No accessory muscle use.  Cardiovascular: Regular rate and rhythm, no murmurs / rubs / gallops. No extremity edema. 2+ pedal pulses. No carotid bruits.  Abdomen: no tenderness, no masses palpated. No hepatosplenomegaly. Bowel sounds positive.  Musculoskeletal: Limited range of motion right lower extremity secondary right acetabular fracture Skin: no rashes, lesions, ulcers. No induration Neurologic: CN 2-12 grossly intact. Sensation intact, DTR normal. Strength 5/5 in all 4.  Psychiatric: Normal judgment and insight. Alert and oriented x 3. Normal mood.    Labs on Admission: I have personally reviewed following labs and imaging studies  CBC: Recent Labs  Lab 05/12/18 1154  WBC 10.0  NEUTROABS 8.0*  HGB 11.7*  HCT 36.1*  MCV 124.5*  PLT 258   Basic Metabolic  Panel: Recent Labs  Lab 05/12/18 1154  NA 142  K 4.3  CL 108  CO2 25  GLUCOSE 131*  BUN 30*  CREATININE 1.70*  CALCIUM 8.6*   GFR: Estimated Creatinine Clearance: 21.1 mL/min (A) (by C-G formula based on SCr of 1.7 mg/dL (H)). Liver Function Tests: No results for input(s): AST, ALT, ALKPHOS, BILITOT, PROT, ALBUMIN in the last 168 hours. No results for input(s): LIPASE, AMYLASE in the last 168 hours. No results for input(s): AMMONIA in the last 168 hours. Coagulation Profile: No results for input(s): INR, PROTIME in the last 168 hours. Cardiac Enzymes: No results for input(s): CKTOTAL, CKMB, CKMBINDEX, TROPONINI in the last 168 hours. BNP (last 3 results) No results for input(s): PROBNP in the last 8760 hours. HbA1C: No results for input(s): HGBA1C in the last 72 hours. CBG: No results for input(s): GLUCAP in the last 168 hours. Lipid Profile: No results for input(s): CHOL, HDL, LDLCALC, TRIG, CHOLHDL, LDLDIRECT in the last 72 hours. Thyroid Function Tests: No results for input(s): TSH, T4TOTAL, FREET4, T3FREE, THYROIDAB in the last 72 hours. Anemia Panel: No results for input(s): VITAMINB12, FOLATE, FERRITIN, TIBC, IRON, RETICCTPCT in the last 72 hours. Urine analysis:    Component Value Date/Time   BILIRUBINUR Negative 08/28/2014 0900   PROTEINUR Negative 08/28/2014 0900   UROBILINOGEN 0.2 08/28/2014 0900   NITRITE Negative 08/28/2014 0900   LEUKOCYTESUR Negative 08/28/2014 0900    Radiological Exams on Admission: Ct Femur Right Wo Contrast  Result Date: 05/12/2018 CLINICAL DATA:  Patient fell and is unable to bear weight. Pain in the upper to mid thigh. EXAM: CT OF THE LOWER RIGHT EXTREMITY WITHOUT CONTRAST TECHNIQUE: Multidetector CT imaging of the right lower extremity was performed according to the standard protocol. COMPARISON:  Same day radiographs of the pelvis and right hip. FINDINGS: Bones/Joint/Cartilage There are acute nondisplaced fractures of the right  puboacetabular junction and mid inferior pubic ramus, series 3/40 and 68. Additionally, subtle fracture lucency involving the medial  wall of the acetabulum extending to the roof of the acetabulum is suggested without significant displacement nor protrusio deformity. Slightly aspherical appearance of the femoral head-neck junction consistent with femoroacetabular impingement morphology. Ligaments Suboptimally assessed by CT. Muscles and Tendons Intramuscular thickening and swelling of the right obturator internus muscle is identified likely a result of adjacent fractures. No active hemorrhage is noted. Soft tissues Extensive circular muscle hypertrophy and sigmoid diverticulosis is noted. The included bladder is decompressed and nondistended. Normal size prostate. IMPRESSION: 1. Acute nondisplaced fractures of the right puboacetabular junction and mid inferior pubic ramus. 2. Subtle fracture lucency involving the medial wall of the acetabulum extending to the roof of the acetabulum is also noted without significant displacement nor protrusio deformity. 3. 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 No proximal femoral fracture is noted. 4. Extensive circular muscle hypertrophy and diverticulosis of the sigmoid colon. Electronically Signed   By: Ashley Royalty M.D.   On: 05/12/2018 02:09   Dg Chest Portable 1 View  Result Date: 05/12/2018 CLINICAL DATA:  Shortness of Breath EXAM: PORTABLE CHEST 1 VIEW COMPARISON:  02/03/2011 FINDINGS: Cardiac shadow is at the upper limits of normal in size. Postsurgical changes are again seen. Large hiatal hernia is noted increased in size when compared with the prior exam. No focal infiltrate or sizable effusion is seen. IMPRESSION: Increase in size of hiatal hernia. This may contribute to the patient's shortness of breath. Electronically Signed    By: Inez Catalina M.D.   On: 05/12/2018 11:57   Dg Hip Unilat W Or Wo Pelvis 2-3 Views Right  Result Date: 05/12/2018 CLINICAL DATA:  Fall at home tonight with right hip pain. EXAM: DG HIP (WITH OR WITHOUT PELVIS) 2-3V RIGHT COMPARISON:  None. FINDINGS: The cortical margins of the bony pelvis and right hip are intact. No fracture. Pubic symphysis and sacroiliac joints are congruent. Both femoral heads are well-seated in the respective acetabula. Age related acetabular spurring of both hips, bilateral hip joint space narrowing is greater on the left. IMPRESSION: No fracture or subluxation of the pelvis or right hip. Electronically Signed   By: Keith Rake M.D.   On: 05/12/2018 00:53   Dg Femur Min 2 Views Right  Result Date: 05/12/2018 CLINICAL DATA:  Patient fell this evening landing on right hip. Lateral hip pain radiating down the leg. EXAM: RIGHT FEMUR 2 VIEWS COMPARISON:  None. FINDINGS: Overlapping AP and lateral views of the right femur are provided. The proximal femur including the femoral head, neck and trochanters are included as part of the same day right hip radiographs. No acute fracture of the included femoral diaphysis, femoral condyles as well as proximal tibia and fibula. Vascular clips are noted along the medial aspect of the thigh, knee and proximal calf. No significant joint space narrowing is noted. Femoral arteriosclerosis is seen. IMPRESSION: No acute osseous abnormality. Electronically Signed   By: Ashley Royalty M.D.   On: 05/12/2018 00:52    EKG: Independently reviewed.  First N0 blast  Assessment/Plan Active Problems:   Hypertension   Hyperlipidemia   GERD (gastroesophageal reflux disease)   CKD (chronic kidney disease), stage III (HCC)   Aortic valve insufficiency   Acetabular fracture ([facility])  Acetabular fracture. ER discussed the case with orthopedics.  They reviewed CT, nonoperative fracture.  Patient is weightbearing as tolerated.  Because patient difficulty with  ambulation pain control in the ER is  counseled to the hospital service for further eval and treatment.  Case management with the patient also regarding observation status and difficulty placement family still wants to pursue placement at their chosen facility.  I have ordered PT and OT, continue with fentanyl for pain control.  Hypertension.  Continue patient's home medication without change blood pressure stable  Edema again continue patient on medications no changes  Aortic valve insufficiency.  We will recheck an echocardiogram x-ray is no aortic stenosis contributing to fall, it was reported patient fell as he was reaching into his pocket  CKD stage III avoid nephrotoxic agents.  Monitor creatinine.  Patient is at his baseline which is about 1.71.8.  DVT prophylaxis: Heparin Code Status: Full code Family Communication: Patient's son was bedside discussed plan of care with son and patient were in agreement.  Indicate they want to pursue skilled nursing facility for rehab.  Disposition Plan: Plan discharge to skilled nursing facility pending PT OT evaluation Consults called: ER discussed case with orthopedics no other consults necessary at this time Admission status: Observation.  Again family is aware of observation status given patient's diagnosis and the impact that has on placement at skilled nursing facility   Nicolette Bang MD [facility] Pager [phone]  If 7PM-7AM, please contact night-coverage [email or web address] Password [facility]  05/12/2018, 2:24 PM

## 2018-05-12 NOTE — ED Notes (Signed)
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  -      Home/SNF/Other Home  Chief Complaint Fall  Level of Care/Admitting Diagnosis ED Disposition    ED Disposition Condition [place]: [facility] [[id number]]  Level of Care: Med-Surg [16]  Diagnosis: Acetabular fracture [facility]) [[id number]]  Admitting Physician: Marcell Anger [[id number]]  Attending Physician: Jackelyn Knife [[id number]]  PT Class (Do Not Modify): Observation [104]  PT Acc Code (Do Not Modify): Observation [10022]       Medical History Past Medical History:  Diagnosis Date  . Allergic rhinitis   . Anemia   . Anemia, chronic renal failure 10/10/2013  . Anemia, normocytic normochromic 10/10/2013  . BPH (benign prostatic hyperplasia)   . Calcium oxalate renal stones   . CKD (chronic kidney disease), stage III ([place])   . Diverticulitis   . Dry senile macular degeneration   . Essential thrombocythemia ([place]) 10/10/2013  . GERD (gastroesophageal reflux disease)   . Hearing loss in left ear   . Heart murmur   . Hyperlipidemia   . Hypertension   . Migraines    OCULAR  . Peripheral neuropathy   . Renal lesion   . Spinal stenosis   . Thrombocytosis ([place]) 08/18/2012   Platelets 711,000  Hb 14, WBC 8,500 08/05/12  . Vitamin deficiency     Allergies Allergies  Allergen Reactions  . Pollen Extract     ragweed    IV Location/Drains/Wounds Patient Lines/Drains/Airways Status   Active Line/Drains/Airways    None          Labs/Imaging Results for orders placed or performed during the hospital encounter of 05/11/18 (from the past 48 hour(s))  CBC with Differential     Status: Abnormal    Collection Time: 05/12/18 11:54 AM  Result Value Ref Range   WBC 10.0 4.0 - 10.5 K/uL   RBC 2.90 (L) 4.22 - 5.81 MIL/uL   Hemoglobin 11.7 (L) 13.0 - 17.0 g/dL   HCT 36.1 (L) 39.0 - 52.0 %   MCV 124.5 (H) 80.0 - 100.0 fL   MCH 40.3 (H) 26.0 - 34.0 pg   MCHC 32.4 30.0 - 36.0 g/dL   RDW 12.8 11.5 - 15.5 %   Platelets 324 150 - 400 K/uL   nRBC 0.0 0.0 - 0.2 %   Neutrophils Relative % 79 %   Neutro Abs 8.0 (H) 1.7 - 7.7 K/uL   Lymphocytes Relative 10 %   Lymphs Abs 1.0 0.7 - 4.0 K/uL   Monocytes Relative 9 %   Monocytes Absolute 0.9 0.1 - 1.0 K/uL   Eosinophils Relative 0 %   Eosinophils Absolute 0.0 0.0 - 0.5 K/uL   Basophils Relative 1 %   Basophils Absolute 0.1 0.0 - 0.1 K/uL   Immature Granulocytes 1 %   Abs Immature Granulocytes 0.10 (H) 0.00 - 0.07 K/uL    Comment: Performed at [facility], [place] [address], [place], [place] [address]  Basic metabolic panel     Status: Abnormal   Collection Time: 05/12/18 11:54 AM  Result Value Ref Range   Sodium 142 135 - 145 mmol/L   Potassium 4.3 3.5 -  5.1 mmol/L   Chloride 108 98 - 111 mmol/L   CO2 25 22 - 32 mmol/L   Glucose, Bld 131 (H) 70 - 99 mg/dL   BUN 30 (H) 8 - 23 mg/dL   Creatinine, Ser 1.70 (H) 0.61 - 1.24 mg/dL   Calcium 8.6 (L) 8.9 - 10.3 mg/dL   GFR calc non Af Amer 34 (L) >60 mL/min   GFR calc Af Amer 39 (L) >60 mL/min   Anion gap 9 5 - 15    Comment: Performed at [facility], [place] [address], [place], [place] [address]   Ct Femur Right Wo Contrast  Result Date: 05/12/2018 CLINICAL DATA:  Patient fell and is unable to bear weight. Pain in the upper to mid thigh. EXAM: CT OF THE LOWER RIGHT EXTREMITY WITHOUT CONTRAST TECHNIQUE: Multidetector CT imaging of the right lower extremity was performed according to the standard protocol. COMPARISON:  Same day radiographs of the pelvis and right hip. FINDINGS: Bones/Joint/Cartilage There are acute nondisplaced fractures of the right  puboacetabular junction and mid inferior pubic ramus, series 3/40 and 68. Additionally, subtle fracture lucency involving the medial wall of the acetabulum extending to the roof of the acetabulum is suggested without significant displacement nor protrusio deformity. Slightly aspherical appearance of the femoral head-neck junction consistent with femoroacetabular impingement morphology. Ligaments Suboptimally assessed by CT. Muscles and Tendons Intramuscular thickening and swelling of the right obturator internus muscle is identified likely a result of adjacent fractures. No active hemorrhage is noted. Soft tissues Extensive circular muscle hypertrophy and sigmoid diverticulosis is noted. The included bladder is decompressed and nondistended. Normal size prostate. IMPRESSION: 1. Acute nondisplaced fractures of the right puboacetabular junction and mid inferior pubic ramus. 2. Subtle fracture lucency involving the medial wall of the acetabulum extending to the roof of the acetabulum is also noted without significant displacement nor protrusio deformity. 3. 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 No proximal femoral fracture is noted. 4. Extensive circular muscle hypertrophy and diverticulosis of the sigmoid colon. Electronically Signed   By: Ashley Royalty M.D.   On: 05/12/2018 02:09   Dg Chest Portable 1 View  Result Date: 05/12/2018 CLINICAL DATA:  Shortness of Breath EXAM: PORTABLE CHEST 1 VIEW COMPARISON:  02/03/2011 FINDINGS: Cardiac shadow is at the upper limits of normal in size. Postsurgical changes are again seen. Large hiatal hernia is noted increased in size when compared with the prior exam. No focal infiltrate or sizable effusion is seen. IMPRESSION: Increase in size of hiatal hernia. This may contribute to the patient's shortness of breath. Electronically Signed    By: Inez Catalina M.D.   On: 05/12/2018 11:57   Dg Hip Unilat W Or Wo Pelvis 2-3 Views Right  Result Date: 05/12/2018 CLINICAL DATA:  Fall at home tonight with right hip pain. EXAM: DG HIP (WITH OR WITHOUT PELVIS) 2-3V RIGHT COMPARISON:  None. FINDINGS: The cortical margins of the bony pelvis and right hip are intact. No fracture. Pubic symphysis and sacroiliac joints are congruent. Both femoral heads are well-seated in the respective acetabula. Age related acetabular spurring of both hips, bilateral hip joint space narrowing is greater on the left. IMPRESSION: No fracture or subluxation of the pelvis or right hip. Electronically Signed   By: Keith Rake M.D.   On: 05/12/2018 00:53   Dg Femur Min 2 Views Right  Result Date: 05/12/2018 CLINICAL DATA:  Patient  fell this evening landing on right hip. Lateral hip pain radiating down the leg. EXAM: RIGHT FEMUR 2 VIEWS COMPARISON:  None. FINDINGS: Overlapping AP and lateral views of the right femur are provided. The proximal femur including the femoral head, neck and trochanters are included as part of the same day right hip radiographs. No acute fracture of the included femoral diaphysis, femoral condyles as well as proximal tibia and fibula. Vascular clips are noted along the medial aspect of the thigh, knee and proximal calf. No significant joint space narrowing is noted. Femoral arteriosclerosis is seen. IMPRESSION: No acute osseous abnormality. Electronically Signed   By: Ashley Royalty M.D.   On: 05/12/2018 00:52    Pending Labs Unresulted Labs (From admission, onward)    Start     Ordered   05/12/18 1102  Urinalysis, Routine w reflex microscopic  Once,   R     05/12/18 1101   Signed and Held  TSH  Once,   R     Signed and Held          Vitals/Pain Today's Vitals   05/12/18 1100 05/12/18 1122 05/12/18 1226 05/12/18 1415  BP: (!) 145/64 (!) 145/64  (!) 145/63  Pulse: 74 72  69  Resp:  20  18  Temp:      TempSrc:      SpO2: 96% 93%  93%   Weight:      Height:      PainSc:   1      Isolation Precautions No active isolations  Medications Medications  fentaNYL (SUBLIMAZE) injection 12.5 mcg (12.5 mcg Intravenous Given 05/12/18 1226)  ezetimibe (ZETIA) tablet 10 mg (10 mg Oral Given 05/12/18 1416)  levothyroxine (SYNTHROID, LEVOTHROID) tablet 25 mcg (25 mcg Oral Given 05/12/18 1417)  metoprolol tartrate (LOPRESSOR) tablet 25 mg (25 mg Oral Given 05/12/18 1225)  methocarbamol (ROBAXIN) tablet 500 mg (500 mg Oral Given 05/12/18 0307)  cyclobenzaprine (FLEXERIL) tablet 5 mg (5 mg Oral Given 05/12/18 1120)  acetaminophen (TYLENOL) tablet 650 mg (650 mg Oral Given 05/12/18 1120)    Mobility non-ambulatory

## 2018-05-12 NOTE — Progress Notes (Signed)
PHARMACIST - PHYSICIAN ORDER COMMUNICATION  CONCERNING: P&T Medication Policy on Herbal Medications  DESCRIPTION:  This patient's order for:  Coq10 and lutein  has been noted.  This product(s) is classified as an "herbal" or natural product. Due to a lack of definitive safety studies or FDA approval, nonstandard manufacturing practices, plus the potential risk of unknown drug-drug interactions while on inpatient medications, the Pharmacy and Therapeutics Committee does not permit the use of "herbal" or natural products of this type within [facility].   ACTION TAKEN: The pharmacy department is unable to verify this order at this time and your patient has been informed of this safety policy. Please reevaluate patient's clinical condition at discharge and address if the herbal or natural product(s) should be resumed at that time.

## 2018-05-12 NOTE — Progress Notes (Addendum)
CSW aware of consult for possible HH vs. SNF placement. CSW aware patient has an acetabulum fracture. CSW spoke with patient, patient's son and patient's daughter-in-law at bedside who are requesting various options for patient. CSW explained patient has Medicare Part A and B but does not meet 3-night inpatient qualifying stay. CSW aware patient is active with [facility] and may be eligible for 3-day waiver. CSW has reached out to [facility] and [facility] regarding bed availability as CSW cannot begin process without knowing which facility patient would like to go to. CSW has left voicemails for both people and admissions and will await return phone calls. CSW aware EDP has placed PT consult. CSW will continue to follow.  9:42am- CSW informed by Claiborne Billings, admissions at [facility], that they will not have a bed available until tomorrow. CSW still awaiting return call from [place] with [facility].  10:46am- CSW informed by Levada Dy, admissions at [facility], that they do not have any beds available for today. CSW aware Hospitalist has been consulted. CSW to await recommendation.  Ollen Barges, [place] Work Department  [employer]  [phone]

## 2018-05-13 ENCOUNTER — Observation Stay (HOSPITAL_BASED_OUTPATIENT_CLINIC_OR_DEPARTMENT_OTHER): Payer: Medicare Other

## 2018-05-13 DIAGNOSIS — I341 Nonrheumatic mitral (valve) prolapse: Secondary | ICD-10-CM

## 2018-05-13 DIAGNOSIS — E785 Hyperlipidemia, unspecified: Secondary | ICD-10-CM | POA: Diagnosis not present

## 2018-05-13 DIAGNOSIS — D473 Essential (hemorrhagic) thrombocythemia: Secondary | ICD-10-CM | POA: Diagnosis not present

## 2018-05-13 DIAGNOSIS — M6281 Muscle weakness (generalized): Secondary | ICD-10-CM | POA: Diagnosis not present

## 2018-05-13 DIAGNOSIS — S32591A Other specified fracture of right pubis, initial encounter for closed fracture: Secondary | ICD-10-CM | POA: Diagnosis not present

## 2018-05-13 DIAGNOSIS — I129 Hypertensive chronic kidney disease with stage 1 through stage 4 chronic kidney disease, or unspecified chronic kidney disease: Secondary | ICD-10-CM | POA: Diagnosis not present

## 2018-05-13 DIAGNOSIS — S32509A Unspecified fracture of unspecified pubis, initial encounter for closed fracture: Secondary | ICD-10-CM | POA: Diagnosis not present

## 2018-05-13 DIAGNOSIS — R0902 Hypoxemia: Secondary | ICD-10-CM | POA: Diagnosis not present

## 2018-05-13 DIAGNOSIS — S32401D Unspecified fracture of right acetabulum, subsequent encounter for fracture with routine healing: Secondary | ICD-10-CM | POA: Diagnosis not present

## 2018-05-13 DIAGNOSIS — I10 Essential (primary) hypertension: Secondary | ICD-10-CM | POA: Diagnosis not present

## 2018-05-13 DIAGNOSIS — E559 Vitamin D deficiency, unspecified: Secondary | ICD-10-CM | POA: Diagnosis not present

## 2018-05-13 DIAGNOSIS — I370 Nonrheumatic pulmonary valve stenosis: Secondary | ICD-10-CM | POA: Diagnosis not present

## 2018-05-13 DIAGNOSIS — I361 Nonrheumatic tricuspid (valve) insufficiency: Secondary | ICD-10-CM

## 2018-05-13 DIAGNOSIS — R5381 Other malaise: Secondary | ICD-10-CM | POA: Diagnosis not present

## 2018-05-13 DIAGNOSIS — I352 Nonrheumatic aortic (valve) stenosis with insufficiency: Secondary | ICD-10-CM | POA: Diagnosis not present

## 2018-05-13 DIAGNOSIS — M2550 Pain in unspecified joint: Secondary | ICD-10-CM | POA: Diagnosis not present

## 2018-05-13 DIAGNOSIS — N401 Enlarged prostate with lower urinary tract symptoms: Secondary | ICD-10-CM | POA: Diagnosis not present

## 2018-05-13 DIAGNOSIS — R2681 Unsteadiness on feet: Secondary | ICD-10-CM | POA: Diagnosis not present

## 2018-05-13 DIAGNOSIS — K219 Gastro-esophageal reflux disease without esophagitis: Secondary | ICD-10-CM | POA: Diagnosis not present

## 2018-05-13 DIAGNOSIS — Z7982 Long term (current) use of aspirin: Secondary | ICD-10-CM | POA: Diagnosis not present

## 2018-05-13 DIAGNOSIS — R278 Other lack of coordination: Secondary | ICD-10-CM | POA: Diagnosis not present

## 2018-05-13 DIAGNOSIS — R29898 Other symptoms and signs involving the musculoskeletal system: Secondary | ICD-10-CM | POA: Diagnosis not present

## 2018-05-13 DIAGNOSIS — I351 Nonrheumatic aortic (valve) insufficiency: Secondary | ICD-10-CM | POA: Diagnosis not present

## 2018-05-13 DIAGNOSIS — J309 Allergic rhinitis, unspecified: Secondary | ICD-10-CM | POA: Diagnosis not present

## 2018-05-13 DIAGNOSIS — S32501D Unspecified fracture of right pubis, subsequent encounter for fracture with routine healing: Secondary | ICD-10-CM | POA: Diagnosis not present

## 2018-05-13 DIAGNOSIS — S32401A Unspecified fracture of right acetabulum, initial encounter for closed fracture: Secondary | ICD-10-CM | POA: Diagnosis not present

## 2018-05-13 DIAGNOSIS — W19XXXD Unspecified fall, subsequent encounter: Secondary | ICD-10-CM | POA: Diagnosis not present

## 2018-05-13 DIAGNOSIS — Z7401 Bed confinement status: Secondary | ICD-10-CM | POA: Diagnosis not present

## 2018-05-13 DIAGNOSIS — E039 Hypothyroidism, unspecified: Secondary | ICD-10-CM | POA: Diagnosis not present

## 2018-05-13 DIAGNOSIS — R52 Pain, unspecified: Secondary | ICD-10-CM | POA: Diagnosis not present

## 2018-05-13 DIAGNOSIS — N183 Chronic kidney disease, stage 3 (moderate): Secondary | ICD-10-CM | POA: Diagnosis not present

## 2018-05-13 DIAGNOSIS — M810 Age-related osteoporosis without current pathological fracture: Secondary | ICD-10-CM | POA: Diagnosis not present

## 2018-05-13 DIAGNOSIS — K5900 Constipation, unspecified: Secondary | ICD-10-CM | POA: Diagnosis not present

## 2018-05-13 LAB — ECHOCARDIOGRAM COMPLETE
Height: 65 in
Weight: 1984 [oz_av]

## 2018-05-13 MED ORDER — SENNOSIDES-DOCUSATE SODIUM 8.6-50 MG PO TABS: 2.0000 | ORAL_TABLET | Freq: Two times a day (BID) | ORAL | 0 refills | Status: DC

## 2018-05-13 MED ORDER — POLYETHYLENE GLYCOL 3350 17 G PO PACK
17.0000 g | PACK | Freq: Every day | ORAL | Status: DC
  Administered 2018-05-13: 17 g via ORAL
  Filled 2018-05-13: qty 1

## 2018-05-13 MED ORDER — HYDROMORPHONE HCL 1 MG/ML IJ SOLN: 0.5000 mg | INTRAMUSCULAR | Status: DC | PRN

## 2018-05-13 MED ORDER — OXYCODONE-ACETAMINOPHEN 10-325 MG PO TABS: 1.0000 | ORAL_TABLET | Freq: Four times a day (QID) | ORAL | 0 refills | Status: DC | PRN

## 2018-05-13 NOTE — Discharge Summary (Signed)
Discharge Summary  Alexander [name], Dr. POE:[id number] DOB: 09/18/1921  PCP: Crist Infante, MD  Admit date: 05/11/2018 Discharge date: 05/13/2018  Time spent: 35 minutes  Recommendations for Outpatient Follow-up:  1. Follow-up with your PCP 2. Follow-up with your cardiologist 3. Continue physical therapy 4. Fall precautions  Discharge Diagnoses:  Active Hospital Problems   Diagnosis Date Noted  . Acetabular fracture ([place]) 05/12/2018  . Fall at home   . Closed fracture of right inferior pubic ramus ([place])   . Aortic valve insufficiency 06/12/2013  . Hypertension   . Hyperlipidemia   . GERD (gastroesophageal reflux disease)   . CKD (chronic kidney disease), stage III [facility])     Resolved Hospital Problems  No resolved problems to display.    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 In the ER patient was noted to have an acetabular fx andhad difficulty with ambulation and weightbearing, hewas unable to be discharged home due to this. He was consulted orthopedics who felt was nonoperative and weightbearing as tolerated. Case management was involved in the ER to try to facilitate placement but they were unable to place the patient at the facility of choice. Because of his inability to ambulate and poor pain control he was consulted to the hospital service for further eval and treatment.  In the ER patient  was afebrile vital signs were stable. BMP was notable for BUN and creatinine of 30 and 1.7 which is patient's baseline. White count normal hemoglobin 11.7. CT scan was notable for acute nondisplaced fracture of the right acetabular junction and mild inferior pubic ramus. Patient was unable to ambulate in the ER and since family is pursuing rehab. Discussion took place regarding observation status they are aware.   05/13/2018: Patient seen and examined at his bedside.  States the pain is well controlled with current pain management particularly when he does not move.  Denies any chest pain, palpitations or dyspnea.   Hospital Course:  Active Problems:   Hypertension   Hyperlipidemia   GERD (gastroesophageal reflux disease)   CKD (chronic kidney disease), stage III (HCC)   Aortic valve insufficiency   Acetabular fracture (HCC)   Fall at home   Closed fracture of right inferior pubic ramus (HCC)  Acute nondisplaced acetabular and mild inferior pubic ramus fracture ER discussed the case with orthopedics. They reviewed CT, nonoperative fracture. Patient is weightbearing as tolerated. Because patient difficulty with ambulation pain control in the ER is counseled to the hospital service for further eval and treatment. Case management with the patient also regarding observation status and difficulty placement family still wants to pursue placement at their chosen facility.  Independent review of CT scan which revealed nondisplaced fracture of the right acetabular PT and OT, continue with fentanyl for pain control. Pain is well controlled with current pain management He is unable to ambulate and PT recommends SNF  CSW consulted for SNF placement Fall precautions  Hypertension. Continue patient's home medication  Blood pressure is stable  Hyperlipidemia Continue Zetia  Hypothyroidism Continue levothyroxine  History of thrombocytosis Continue hydroxyurea  Aortic valve  insufficiency.   2D echo completed on 05/13/2018.    CKD stage III Avoid nephrotoxic agents. Monitor creatinine. Patient is at his baseline which is about 1.7.  Patient declines lab work this morning.   DVT prophylaxis: Subcu heparin 3 times daily Code Status:Full code Family Communication: None at bedside this morning.    Disposition Plan:Plan discharge to skilled nursing facility if insurance authorization and bed is available Consults called:ER discussed case with orthopedics no other consults necessary at this time    Discharge Exam: BP (!) 126/50 (BP Location: Right Arm)   Pulse 64   Temp 98.7 F (37.1 C) (Oral)   Resp 15   Ht 5\' 5"  (1.651 m)   Wt 56.2 kg   SpO2 95%   BMI 20.63 kg/m  . General: 85 y.o. year-old male pleasant well developed well nourished in no acute distress.  Alert and interactive. . Cardiovascular: Regular rate and rhythm with no rubs or gallops.  No thyromegaly or JVD noted.  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 [name] Respiratory: Clear to auscultation with no wheezes or rales. Good inspiratory effort. . Abdomen: Soft nontender nondistended with normal bowel sounds x4 quadrants. . Musculoskeletal: Trace lower extremity edema. 2/4 pulses in all 4 extremities. [name] Psychiatry: Mood is appropriate for condition and setting  Discharge Instructions You were cared for by a hospitalist during your hospital stay. If you have any questions about your discharge medications or the care you received while you were in the hospital after you are discharged, you can call the unit and asked to speak with the hospitalist on call if the hospitalist that took care of you is not available. Once you are discharged, your primary care physician will handle any further medical issues. Please note that NO REFILLS for any discharge medications will be authorized once you are discharged, as it is imperative that you return to your primary care physician (or establish a  relationship with a primary care physician if you do not have one) for your aftercare needs so that they can reassess your need for medications and monitor your lab values.   Allergies as of 05/13/2018      Reactions   Pollen Extract    ragweed      Medication List    TAKE these medications   aluminum & magnesium hydroxide-simethicone 500-450-40 MG/5ML suspension Commonly known as:  MYLANTA Take by mouth every 6 (six) hours as needed. 200-200-20mg /48ml   aspirin 81 MG tablet Take 81 mg by mouth daily.   cholecalciferol 1000 units tablet Commonly known as:  VITAMIN D Take 2,000 Units by mouth daily.   Co Q-10 300 MG Caps Take 1 capsule by mouth daily.   denosumab 60 MG/ML Soln injection Commonly known as:  PROLIA Inject 60 mg into the skin every 6 (six) months. Administer in upper arm, thigh, or abdomen   ezetimibe 10 MG tablet Commonly known as:  ZETIA Take 10 mg by mouth daily.   finasteride 5 MG tablet Commonly known as:  PROSCAR Take 1 tablet by mouth 3 (three) times a week. TIW   fluticasone 50 MCG/ACT nasal spray Commonly known as:  FLONASE Place 1 spray into both nostrils daily as needed for allergies.   hydroxyurea 500 MG capsule Commonly known as:  HYDREA TAKE  1 CAPSULE BY MOUTH  DAILY. MAY TAKE WITH FOOD  TO MINIMIZE GI SIDE  EFFECTS. What changed:  See the new instructions.   levothyroxine 25 MCG tablet Commonly known as:  SYNTHROID, LEVOTHROID Take 25 mcg by mouth daily before breakfast.   LUTEIN PO Take 6 mg by mouth daily.   Melatonin 1 MG Tabs Take 1.5 mg by mouth at bedtime as needed (sleep).   metoprolol tartrate 25 MG tablet Commonly known as:  LOPRESSOR Take 0.5 tablets (12.5 mg total) by mouth 2 (two) times daily. What changed:    how much to take  when to take this   multivitamin tablet Take 1 tablet by mouth daily.   oxyCODONE-acetaminophen 10-325 MG tablet Commonly known as:  PERCOCET Take 1 tablet by mouth every 6 (six) hours  as needed for pain.   PRESERVISION AREDS PO Take 1 capsule by mouth 2 (two) times daily.   ranitidine 150 MG capsule Commonly known as:  ZANTAC Take 150 mg by mouth daily.   senna-docusate 8.6-50 MG tablet Commonly known as:  Senokot-S Take 2 tablets by mouth 2 (two) times daily.   temazepam 7.5 MG capsule Commonly known as:  RESTORIL Take 1 capsule (7.5 mg total) by mouth at bedtime as needed for sleep.   VITAMIN B-12 IJ Inject as directed. monthly      Allergies  Allergen Reactions  . Pollen Extract     ragweed   Follow-up Information    Crist Infante, MD. Call in 1 day(s).   Specialty:  Internal Medicine Why:  Please call for a post hospital follow-up appointment. Contact information: [address] [place] [address] [phone]        Troy Sine, MD. Call in 1 day(s).   Specialty:  Cardiology Why:  Please call for a post hospital follow-up appointment. Contact information: [address] [place] [address] [address] [phone]            The results of significant diagnostics from this hospitalization (including imaging, microbiology, ancillary and laboratory) are listed below for reference.    Significant Diagnostic Studies: Ct Femur Right Wo Contrast  Result Date: 05/12/2018 CLINICAL DATA:  Patient fell and is unable to bear weight. Pain in the upper to mid thigh. EXAM: CT OF THE LOWER RIGHT EXTREMITY WITHOUT CONTRAST TECHNIQUE: Multidetector CT imaging of the right lower extremity was performed according to the standard protocol. COMPARISON:  Same day radiographs of the pelvis and right hip. FINDINGS: Bones/Joint/Cartilage There are acute nondisplaced fractures of the right puboacetabular junction and mid inferior pubic ramus, series 3/40 and 68. Additionally, subtle fracture lucency involving the medial wall of the acetabulum extending to the roof of the acetabulum is suggested without significant displacement nor protrusio  deformity. Slightly aspherical appearance of the femoral head-neck junction consistent with femoroacetabular impingement morphology. Ligaments Suboptimally assessed by CT. Muscles and Tendons Intramuscular thickening and swelling of the right obturator internus muscle is identified likely a result of adjacent fractures. No active hemorrhage is noted. Soft tissues Extensive circular muscle hypertrophy and sigmoid diverticulosis is noted. The included bladder is decompressed and nondistended. Normal size prostate. IMPRESSION: 1. Acute nondisplaced fractures of the right puboacetabular junction and mid inferior pubic ramus. 2. Subtle fracture lucency involving the medial wall of the acetabulum extending to the roof of the acetabulum is also noted without significant displacement nor protrusio deformity. 3. 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 No proximal femoral fracture is noted. 4. Extensive circular muscle hypertrophy and diverticulosis of the sigmoid colon. Electronically Signed   By: Ashley Royalty M.D.   On: 05/12/2018 02:09   Dg Chest Portable 1 View  Result Date: 05/12/2018 CLINICAL DATA:  Shortness of Breath EXAM: PORTABLE CHEST 1 VIEW COMPARISON:  02/03/2011 FINDINGS: Cardiac shadow is at the upper limits of normal in size. Postsurgical changes are again seen. Large hiatal hernia is noted increased in size when compared with the prior exam. No focal infiltrate or sizable effusion is seen. IMPRESSION: Increase in size of hiatal hernia. This may contribute to the patient's shortness of breath. Electronically Signed   By: Inez Catalina M.D.   On: 05/12/2018 11:57   Dg Hip Unilat W Or Wo Pelvis 2-3 Views Right  Result Date: 05/12/2018 CLINICAL DATA:  Fall at home tonight with right hip pain. EXAM: DG HIP (WITH OR WITHOUT PELVIS) 2-3V RIGHT COMPARISON:  None. FINDINGS:  The cortical margins of the bony pelvis and right hip are intact. No fracture. Pubic symphysis and sacroiliac joints are congruent. Both femoral heads are well-seated in the respective acetabula. Age related acetabular spurring of both hips, bilateral hip joint space narrowing is greater on the left. IMPRESSION: No fracture or subluxation of the pelvis or right hip. Electronically Signed   By: Keith Rake M.D.   On: 05/12/2018 00:53   Dg Femur Min 2 Views Right  Result Date: 05/12/2018 CLINICAL DATA:  Patient fell this evening landing on right hip. Lateral hip pain radiating down the leg. EXAM: RIGHT FEMUR 2 VIEWS COMPARISON:  None. FINDINGS: Overlapping AP and lateral views of the right femur are provided. The proximal femur including the femoral head, neck and trochanters are included as part of the same day right hip radiographs. No acute fracture of the included femoral diaphysis, femoral condyles as well as proximal tibia and fibula. Vascular clips are noted along the medial aspect of the thigh, knee and proximal calf. No significant joint space narrowing is noted. Femoral arteriosclerosis is seen. IMPRESSION: No acute osseous abnormality. Electronically Signed   By: Ashley Royalty M.D.   On: 05/12/2018 00:52    Microbiology: No results found for this or any previous visit (from the past 240 hour(s)).   Labs: Basic Metabolic Panel: Recent Labs  Lab 05/12/18 1154  NA 142  K 4.3  CL 108  CO2 25  GLUCOSE 131*  BUN 30*  CREATININE 1.70*  CALCIUM 8.6*   Liver Function Tests: No results for input(s): AST, ALT, ALKPHOS, BILITOT, PROT, ALBUMIN in the last 168 hours. No results for input(s): LIPASE, AMYLASE in the last 168 hours. No results for input(s): AMMONIA in the last 168 hours. CBC: Recent Labs  Lab 05/12/18 1154  WBC 10.0  NEUTROABS 8.0*  HGB 11.7*  HCT 36.1*  MCV 124.5*  PLT 324   Cardiac Enzymes: No results for input(s): CKTOTAL, CKMB, CKMBINDEX, TROPONINI in the last  168 hours. BNP: BNP (last 3 results) No results for input(s): BNP in the last 8760 hours.  ProBNP (last 3 results) No results for input(s): PROBNP in the last 8760 hours.  CBG: No results for input(s): GLUCAP in the last 168 hours.     Signed:  Kayleen Memos, MD [facility] 05/13/2018, 1:35 PM

## 2018-05-13 NOTE — Care Management Obs Status (Signed)
[place] NOTIFICATION   Patient Details  Name: Alexander [name], Dr. MRN: [id number] Date of Birth: [birth year]   Medicare Observation Status Notification Given:  Yes    Guadalupe Maple, RN 05/13/2018, 12:35 PM

## 2018-05-13 NOTE — Progress Notes (Signed)
PROGRESS NOTE  [name], Dr. 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  In the ER patient was noted to have an acetabular fx and  had difficulty with ambulation and weightbearing, he was unable to be discharged home due to this.  He was consulted orthopedics who felt was nonoperative and weightbearing as tolerated.  Case management was involved in the ER to try to facilitate placement but they were unable to place the patient at the facility of choice.  Because of his inability to ambulate and poor pain control he was consulted to the hospital service for further eval and treatment.  In the ER patient was afebrile vital signs were stable.  BMP was notable for BUN and creatinine of 30 and 1.7 which is patient's baseline.  White count normal hemoglobin 11.7.  CT scan was notable for acute nondisplaced fracture of the right acetabular junction and mild inferior pubic ramus.  Patient was unable to ambulate in the ER and since family is pursuing rehab.  Discussion took place regarding observation status they are aware.   05/13/2018: Patient seen and examined at his bedside.  States the pain is well controlled with current pain management particularly when he does not move.  Denies any chest pain, palpitations or dyspnea.  Assessment/Plan: Active Problems:   Hypertension   Hyperlipidemia   GERD (gastroesophageal reflux disease)   CKD (chronic kidney disease), stage III (HCC)   Aortic valve insufficiency   Acetabular fracture (HCC)   Fall at home   Closed fracture of right inferior  pubic ramus (HCC)  Acute nondisplaced acetabular and mild inferior pubic ramus fracture ER discussed the case with orthopedics.  They reviewed CT, nonoperative fracture.  Patient is weightbearing as tolerated.  Because patient difficulty with ambulation pain control in the ER is counseled to the hospital service for further eval and treatment. Case management with the patient also regarding observation status and difficulty placement family still wants to pursue placement at their chosen facility.   Independent review of CT scan which revealed nondisplaced fracture of the right acetabular PT and OT, continue with fentanyl for pain control. Pain is well controlled with current pain management He is unable to ambulate and PT recommends SNF CSW consulted for SNF placement Fall precautions  Hypertension.  Continue patient's home medication  Blood pressure is stable  Hyperlipidemia Continue Zetia  Hypothyroidism Continue levothyroxine  History of thrombocytosis Continue hydroxyurea  Aortic valve insufficiency.    2D echo completed on 05/13/2018.    CKD stage III Avoid nephrotoxic agents.  Monitor creatinine.  Patient is at his baseline which is about 1.7.  Patient declines lab work this morning.   DVT prophylaxis:  Subcu heparin 3 times daily Code Status: Full code Family Communication: None at bedside this morning.    Disposition Plan: Plan discharge to skilled nursing facility if insurance authorization and bed is available Consults called: ER discussed case with orthopedics no other consults necessary at this time    Objective: Vitals:   05/12/18 1415 05/12/18 2114 05/13/18 0677  05/13/18 0546  BP: (!) 145/63 (!) 134/55 (!) 132/57 (!) 126/50  Pulse: 69 (!) 58 62 64  Resp: 18 15 15 15   Temp:  99.1 F (37.3 C) 98.7 F (37.1 C) 98.7 F (37.1 C)  TempSrc:  Oral Oral Oral  SpO2: 93% 94% 93% 95%  Weight:      Height:        Intake/Output Summary (Last 24 hours) at  05/13/2018 1211 Last data filed at 05/13/2018 1034 Gross per 24 hour  Intake 510 ml  Output 550 ml  Net -40 ml   Filed Weights   05/11/18 2358  Weight: 56.2 kg    Exam:  . General: 85 y.o. year-old male pleasant well developed well nourished in no acute distress.  Alert and interactive. . Cardiovascular: Regular rate and rhythm with no rubs or gallops.  No thyromegaly or JVD noted.  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 [name] Respiratory: Clear to auscultation with no wheezes or rales. Good inspiratory effort. . Abdomen: Soft nontender nondistended with normal bowel sounds x4 quadrants. . Musculoskeletal: Trace lower extremity edema. 2/4 pulses in all 4 extremities. [name] Psychiatry: Mood is appropriate for condition and setting   Data Reviewed: CBC: Recent Labs  Lab 05/12/18 1154  WBC 10.0  NEUTROABS 8.0*  HGB 11.7*  HCT 36.1*  MCV 124.5*  PLT 976   Basic Metabolic Panel: Recent Labs  Lab 05/12/18 1154  NA 142  K 4.3  CL 108  CO2 25  GLUCOSE 131*  BUN 30*  CREATININE 1.70*  CALCIUM 8.6*   GFR: Estimated Creatinine Clearance: 21.1 mL/min (A) (by C-G formula based on SCr of 1.7 mg/dL (H)). Liver Function Tests: No results for input(s): AST, ALT, ALKPHOS, BILITOT, PROT, ALBUMIN in the last 168 hours. No results for input(s): LIPASE, AMYLASE in the last 168 hours. No results for input(s): AMMONIA in the last 168 hours. Coagulation Profile: No results for input(s): INR, PROTIME in the last 168 hours. Cardiac Enzymes: No results for input(s): CKTOTAL, CKMB, CKMBINDEX, TROPONINI in the last 168 hours. BNP (last 3 results) No results for input(s): PROBNP in the last 8760 hours. HbA1C: No results for input(s): HGBA1C in the last 72 hours. CBG: No results for input(s): GLUCAP in the last 168 hours. Lipid Profile: No results for input(s): CHOL, HDL, LDLCALC, TRIG, CHOLHDL, LDLDIRECT in the last 72 hours. Thyroid Function Tests: Recent Labs    05/12/18 1647  TSH 0.946    Anemia Panel: No results for input(s): VITAMINB12, FOLATE, FERRITIN, TIBC, IRON, RETICCTPCT in the last 72 hours. Urine analysis:    Component Value Date/Time   COLORURINE YELLOW 05/12/2018 1102   APPEARANCEUR CLEAR 05/12/2018 1102   LABSPEC 1.020 05/12/2018 1102   PHURINE 6.0 05/12/2018 1102   GLUCOSEU NEGATIVE 05/12/2018 1102   HGBUR NEGATIVE 05/12/2018 [place] 05/12/2018 1102   BILIRUBINUR Negative 08/28/2014 0900   KETONESUR NEGATIVE 05/12/2018 1102   PROTEINUR 30 (A) 05/12/2018 1102   UROBILINOGEN 0.2 08/28/2014 0900   NITRITE NEGATIVE 05/12/2018 1102   LEUKOCYTESUR NEGATIVE 05/12/2018 1102   Sepsis Labs: @LABRCNTIP (procalcitonin:4,lacticidven:4)  )No results found for this or any previous visit (from the past 240 hour(s)).    Studies: No results found.  Scheduled Meds: . aspirin EC  81 mg Oral Daily  . cholecalciferol  2,000 Units Oral Daily  . ezetimibe  10 mg Oral Daily  . famotidine  10 mg Oral Daily  . finasteride  5 mg Oral Once per day on  Mon Wed Fri  . heparin  5,000 Units Subcutaneous Q8H  . hydroxyurea  500 mg Oral Daily  . levothyroxine  25 mcg Oral QAC breakfast  . metoprolol tartrate  25 mg Oral Daily  . multivitamin   Oral BID  . multivitamin with minerals  1 tablet Oral Daily  . polyethylene glycol  17 g Oral Daily    Continuous Infusions:   LOS: 0 days     Kayleen Memos, MD [facility] Pager [phone]  If 7PM-7AM, please contact night-coverage [email or web address] Password [email or web address] 05/13/2018, 12:11 PM

## 2018-05-13 NOTE — NC FL2 (Signed)
[place] LEVEL OF CARE SCREENING TOOL     IDENTIFICATION  Patient Name: Alexander [name], Dr. Curt Jews: 08/31/1921 Sex: male Admission Date (Current Location): 05/11/2018  [facility] and [place] Number:  Herbalist and Address:  [facility],  [address] [place], [place]      Provider Number: [id number]  Attending Physician Name and Address:  Kayleen Memos, DO  Relative Name and Phone Number:  Darick Fetters [phone]    Current Level of Care: Hospital Recommended Level of Care: [place] Prior Approval Number:    Date Approved/Denied:   PASRR Number: [id number]  Discharge Plan: SNF    Current Diagnoses: Patient Active Problem List   Diagnosis Date Noted  . Acetabular fracture ([place]) 05/12/2018  . Fall at home   . Closed fracture of right inferior pubic ramus ([place])   . Essential thrombocythemia ([place]) 10/10/2013  . Anemia, normocytic normochromic 10/10/2013  . Anemia, chronic renal failure 10/10/2013  . Aortic valve insufficiency 06/12/2013  . CAD (coronary artery disease) of artery bypass graft 12/12/2012  . Hiatal hernia 12/12/2012  . Inguinal hernia, left 10/21/2012  . Thrombocytosis ([place]) 08/18/2012  . Moderate persistent asthma without complication 70/78/6754  . Hypertension   . Hyperlipidemia   . GERD (gastroesophageal reflux disease)   . Spinal stenosis   . Migraines   . CKD (chronic kidney disease), stage III ([place])   . BPH (benign prostatic hyperplasia)   . Heart murmur   . Calcium oxalate renal stones   . Allergic rhinitis   . Peripheral neuropathy     Orientation RESPIRATION BLADDER Height & Weight     Self, Time, Situation, Place  Normal Continent Weight: 124 lb (56.2 kg) Height:  5\' 5"  (165.1 cm)  BEHAVIORAL SYMPTOMS/MOOD NEUROLOGICAL BOWEL NUTRITION STATUS      Continent Diet  AMBULATORY STATUS COMMUNICATION OF NEEDS Skin   Extensive Assist Verbally Normal                        Personal Care Assistance Level of Assistance  Bathing, Feeding, Dressing Bathing Assistance: Limited assistance Feeding assistance: Independent Dressing Assistance: Limited assistance     Functional Limitations Info             SPECIAL CARE FACTORS FREQUENCY  PT (By licensed PT), OT (By licensed OT)     PT Frequency: 5 OT Frequency: 5            Contractures      Additional Factors Info  Code Status Code Status Info: Full             Current Medications (05/13/2018):  This is the current hospital active medication list Current Facility-Administered Medications  Medication Dose Route Frequency Provider Last Rate Last Dose  . acetaminophen (TYLENOL) tablet 650 mg  650 mg Oral Q6H PRN Spongberg, Audie Pinto, MD       Or  . acetaminophen (TYLENOL) suppository 650 mg  650 mg Rectal Q6H PRN Spongberg, Audie Pinto, MD      . alum & mag hydroxide-simeth (MAALOX/MYLANTA) 200-200-20 MG/5ML suspension 15 mL  15 mL Oral Q6H PRN Marcell Anger, MD      . aspirin EC tablet 81 mg  81 mg Oral Daily Marcell Anger, MD   81 mg at 05/12/18 1811  . bisacodyl (DULCOLAX) EC tablet 5 mg  5 mg Oral Daily PRN Spongberg, Audie Pinto, MD      .  cholecalciferol (VITAMIN D3) tablet 2,000 Units  2,000 Units Oral Daily Marcell Anger, MD   2,000 Units at 05/12/18 1811  . ezetimibe (ZETIA) tablet 10 mg  10 mg Oral Daily Marcell Anger, MD   10 mg at 05/12/18 1416  . famotidine (PEPCID) tablet 10 mg  10 mg Oral Daily Marcell Anger, MD   10 mg at 05/12/18 1811  . fentaNYL (SUBLIMAZE) injection 12.5 mcg  12.5 mcg Intravenous Q2H PRN Marcell Anger, MD   12.5 mcg at 05/12/18 1808  . finasteride (PROSCAR) tablet 5 mg  5 mg Oral Once per day on Mon Wed Fri Spongberg, Christopher N, MD   5 mg at 05/12/18 1811  . fluticasone (FLONASE) 50 MCG/ACT nasal spray 1 spray  1 spray Each Nare Daily PRN Spongberg, Audie Pinto, MD      . heparin  injection 5,000 Units  5,000 Units Subcutaneous Q8H Marcell Anger, MD   5,000 Units at 05/13/18 0536  . hydroxyurea (HYDREA) capsule 500 mg  500 mg Oral Daily Marcell Anger, MD   500 mg at 05/12/18 1812  . levothyroxine (SYNTHROID, LEVOTHROID) tablet 25 mcg  25 mcg Oral QAC breakfast Marcell Anger, MD   25 mcg at 05/13/18 0536  . Melatonin TABS 5 mg  5 mg Oral QHS PRN Marcell Anger, MD      . metoprolol tartrate (LOPRESSOR) tablet 25 mg  25 mg Oral Daily Marcell Anger, MD   25 mg at 05/12/18 1225  . multivitamin (PROSIGHT) tablet   Oral BID Marcell Anger, MD   1 tablet at 05/12/18 2113  . multivitamin with minerals tablet 1 tablet  1 tablet Oral Daily Marcell Anger, MD   1 tablet at 05/12/18 1811  . ondansetron (ZOFRAN) tablet 4 mg  4 mg Oral Q6H PRN Spongberg, Audie Pinto, MD       Or  . ondansetron (ZOFRAN) injection 4 mg  4 mg Intravenous Q6H PRN Spongberg, Audie Pinto, MD      . polyethylene glycol (MIRALAX / GLYCOLAX) packet 17 g  17 g Oral Daily Hall, Carole N, DO      . senna-docusate (Senokot-S) tablet 1 tablet  1 tablet Oral QHS PRN Spongberg, Audie Pinto, MD      . temazepam (RESTORIL) capsule 7.5 mg  7.5 mg Oral QHS PRN Spongberg, Audie Pinto, MD         Discharge Medications: Please see discharge summary for a list of discharge medications.  Relevant Imaging Results:  Relevant Lab Results:   Additional Information SSN: [id number]  Ollen Barges, LCSWA

## 2018-05-13 NOTE — Progress Notes (Signed)
Rn called report to Tammy at SNF, [facility], [place]. All questions answered.   Paperwork sent with [facility].   [facility] transported patient to SNF.

## 2018-05-13 NOTE — Evaluation (Signed)
Physical Therapy Evaluation Patient Details Name: Alexander [name], Dr. 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 Orthopedics consulted who felt LE was nonoperative and recommend weightbearing as tolerated. 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 Moderate pain with activity. He was also fearful of R LE giving way with weightbearing. He was able to perform a stand pivot with small shuffling steps. Discussed d/c plan-pt is agreeable to ST [facility]hab at [facility]. Will follow and progress activity as tolerated.     Follow Up Recommendations SNF    Equipment Recommendations  None recommended by PT    Recommendations for Other Services       Precautions / Restrictions Precautions Precautions: Fall Restrictions Weight Bearing Restrictions: No RLE Weight Bearing: Weight bearing as tolerated      Mobility  Bed Mobility               General bed mobility comments: Pt seated EOB upon arrival   Transfers Overall transfer level: Needs assistance Equipment used: Rolling walker (2 wheeled) Transfers: Sit to/from Stand Sit to Stand: Min assist;+2 physical assistance;+2 safety/equipment;From elevated surface Stand pivot transfers: Min assist;+2 physical assistance;+2 safety/equipment       General transfer comment: Assist to rise and steady from slightly elevated EOB. VCs hand placement, technique, sequence. Pt with increased difficulty bearing weight through RLE to attempt ambulatory steps. He was able to take small shuffling steps to turn towards recliner given increased time/effort. Assist for maneuvering RW during transition  Ambulation/Gait             General  Gait Details: Attempted but pt was unable to tolerate. He was also fearful of leg giving way  Stairs            Wheelchair Mobility    Modified Rankin (Stroke Patients Only)       Balance Overall balance assessment: Needs assistance Sitting-balance support: Feet supported Sitting balance-Leahy Scale: Good     Standing balance support: Bilateral upper extremity supported Standing balance-Leahy Scale: Poor                               Pertinent Vitals/Pain Pain Assessment: Faces Faces Pain Scale: Hurts even more Pain Location: R hip with activity Pain Descriptors / Indicators: Grimacing;Guarding;Sharp Pain Intervention(s): Limited activity within patient's tolerance;Monitored during session;Repositioned    Home Living Family/patient expects to be discharged to:: Skilled nursing facility Living Arrangements: Spouse/significant other(girlfriend) Available Help at Discharge: Family;Friend(s);Available PRN/intermittently Type of Home: House Home Access: Stairs to enter Entrance Stairs-Rails: Can reach both;Left;Right Entrance Stairs-Number of Steps: 3 Home Layout: Two level;Able to live on main level with bedroom/bathroom Home Equipment: Kasandra Knudsen - single point;Shower seat      Prior Function Level of Independence: Independent with assistive device(s)         Comments: occasional use of SPC, son reports he typically carries it      Hand Dominance        Extremity/Trunk Assessment   Upper Extremity Assessment Upper Extremity Assessment: Defer to OT evaluation    Lower Extremity Assessment Lower Extremity Assessment: Generalized weakness    Cervical / Trunk Assessment Cervical /  Trunk Assessment: Kyphotic  Communication   Communication: HOH(slightly HOH)  Cognition Arousal/Alertness: Awake/alert Behavior During Therapy: WFL for tasks assessed/performed Overall Cognitive Status: Within Functional Limits for tasks assessed                                         General Comments General comments (skin integrity, edema, etc.): son present during session    Exercises     Assessment/Plan    PT Assessment Patient needs continued PT services  PT Problem List Decreased strength;Decreased range of motion;Decreased balance;Decreased mobility;Decreased activity tolerance;Decreased knowledge of use of DME;Pain       PT Treatment Interventions DME instruction;Gait training;Therapeutic activities;Balance training;Functional mobility training;Patient/family education;Therapeutic exercise    PT Goals (Current goals can be found in the Care Plan section)  Acute Rehab PT Goals Patient Stated Goal: get back to doing the foxtrot PT Goal Formulation: With patient Time For Goal Achievement: 05/27/18 Potential to Achieve Goals: Good    Frequency Min 2X/week   Barriers to discharge        Co-evaluation PT/OT/SLP Co-Evaluation/Treatment: Yes Reason for Co-Treatment: For patient/therapist safety PT goals addressed during session: Mobility/safety with mobility;Proper use of DME OT goals addressed during session: ADL's and self-care;Proper use of Adaptive equipment and DME       AM-PAC PT "6 Clicks" Mobility  Outcome Measure Help needed turning from your back to your side while in a flat bed without using bedrails?: A Little Help needed moving from lying on your back to sitting on the side of a flat bed without using bedrails?: A Little Help needed moving to and from a bed to a chair (including a wheelchair)?: A Lot Help needed standing up from a chair using your arms (e.g., wheelchair or bedside chair)?: A Lot Help needed to walk in hospital room?: A Lot Help needed climbing 3-5 steps with a railing? : Total 6 Click Score: 13    End of Session Equipment Utilized During Treatment: Gait belt Activity Tolerance: Patient limited by pain Patient left: in chair;with call bell/phone within reach;with family/visitor  present;with chair alarm set   PT Visit Diagnosis: Unsteadiness on feet (R26.81);Other abnormalities of gait and mobility (R26.89);History of falling (Z91.81);Muscle weakness (generalized) (M62.81);Difficulty in walking, not elsewhere classified (R26.2);Pain Pain - Right/Left: Right Pain - part of body: Leg    Time: 9476-5465 PT Time Calculation (min) (ACUTE ONLY): 17 min   Charges:   PT Evaluation $PT Eval Moderate Complexity: [place], PT Acute Rehabilitation Services Pager: [phone] Office: [phone]

## 2018-05-13 NOTE — Progress Notes (Signed)
  Echocardiogram 2D Echocardiogram has been performed.  Alexander [name] 05/13/2018, 9:27 AM

## 2018-05-13 NOTE — Evaluation (Signed)
Occupational Therapy Evaluation Patient Details Name: Alexander [name], Dr. MRN: [id number] DOB: March 11, 1922 Today's Date: 05/13/2018    History of Present Illness Pt is a 85 y/o male who sustained fall at home resulting in acute nondisplaced fractures of the right puboacetabular junction and mid inferior pubic ramus. Orthopedics consulted who felt LE was nonoperative and recommend weightbearing as tolerated. 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 At baseline pt reports he is mod independent with ADL and functional mobility, occasionally using SPC. Pt presenting with increased RLE pain and instability, generalized weakness. Pt very pleasant and willing to participate in therapy session. Pt requiring two person assist to stand from EOB and for stand pivot transfer to recliner. He currently requires minguard assist for seated UB ADL, mod-maxA for LB ADL. Pt will benefit from continued acute OT services and recommend follow up therapy services in SNF setting prior to return home to maximize his safety and independence with ADL and mobility. Will follow.     Follow Up Recommendations  SNF;Supervision/Assistance - 24 hour    Equipment Recommendations  3 in 1 bedside commode;Other (comment)(TBD in next venue)           Precautions / Restrictions Precautions Precautions: Fall Restrictions Weight Bearing Restrictions: No RLE Weight Bearing: Weight bearing as tolerated      Mobility Bed Mobility               General bed mobility comments: Pt seated EOB upon arrival   Transfers Overall transfer level: Needs assistance Equipment used: Rolling walker (2 wheeled) Transfers: Sit to/from [facility] Sit to Stand: Min assist;+2 physical assistance;+2 safety/equipment;From elevated surface Stand pivot transfers: Min assist;+2 physical assistance;+2 safety/equipment        General transfer comment: assist to rise and steady from slightly elevated EOB, VCs hand placement; pt with increased difficulty bearing weight through RLE to attempt steps, able to take small shuffling steps to turn towards recliner given increased time/effort, assist for maneuvering RW during transition    Balance Overall balance assessment: Needs assistance Sitting-balance support: Feet supported Sitting balance-Leahy Scale: Good     Standing balance support: Bilateral upper extremity supported Standing balance-Leahy Scale: Poor                             ADL either performed or assessed with clinical judgement   ADL Overall ADL's : Needs assistance/impaired Eating/Feeding: Modified independent;Sitting   Grooming: Wash/dry face;Set up;Sitting   Upper Body Bathing: Min guard;Sitting   Lower Body Bathing: Moderate assistance;+2 for safety/equipment;+2 for physical assistance;Sit to/from stand   Upper Body Dressing : Min guard;Set up;Sitting   Lower Body Dressing: Moderate assistance;Sit to/from stand;+2 for safety/equipment;+2 for physical assistance   Toilet Transfer: Minimal assistance;+2 for physical assistance;+2 for safety/equipment;Stand-pivot;BSC;RW Toilet Transfer Details (indicate cue type and reason): simulated in transfer to [place] and Hygiene: Moderate assistance;+2 for physical assistance;+2 for safety/equipment;Sit to/from stand       Functional mobility during ADLs: Minimal assistance;+2 for physical assistance;+2 for safety/equipment(stand pivot transfer) General ADL Comments: pt very pleasant during session; limited due to pain and feelings of RLE instability     Vision         Perception     Praxis      Pertinent Vitals/Pain Pain Assessment: Faces Faces Pain Scale: Hurts even more  Pain Location: R hip Pain Descriptors / Indicators: Grimacing;Guarding;Sharp Pain Intervention(s): Limited activity  within patient's tolerance;Monitored during session;Repositioned     Hand Dominance     Extremity/Trunk Assessment Upper Extremity Assessment Upper Extremity Assessment: Generalized weakness   Lower Extremity Assessment Lower Extremity Assessment: Defer to PT evaluation       Communication Communication Communication: HOH(slightly HOH)   Cognition Arousal/Alertness: Awake/alert Behavior During Therapy: WFL for tasks assessed/performed Overall Cognitive Status: Within Functional Limits for tasks assessed                                     General Comments  son present during session    Exercises     Shoulder Instructions      Home Living Family/patient expects to be discharged to:: Skilled nursing facility Living Arrangements: Spouse/significant other(girlfriend) Available Help at Discharge: Family;Friend(s);Available PRN/intermittently Type of Home: House Home Access: Stairs to enter [employer] of Steps: 3 Entrance Stairs-Rails: Can reach both;Left;Right Home Layout: Two level;Able to live on main level with bedroom/bathroom     Bathroom Shower/Tub: Teacher, early years/pre: Standard     Home Equipment: Cane - single point;Shower seat          Prior Functioning/Environment Level of Independence: Independent with assistive device(s)        Comments: occasional use of SPC, son reports he typically carries it         OT Problem List: Decreased strength;Decreased range of motion;Decreased activity tolerance;Impaired balance (sitting and/or standing);Decreased knowledge of use of DME or AE;Pain      OT Treatment/Interventions: Self-care/ADL training;Therapeutic exercise;DME and/or AE instruction;Therapeutic activities;Patient/family education;Balance training    OT Goals(Current goals can be found in the care plan section) Acute Rehab OT Goals Patient Stated Goal: get back to doing the foxtrot OT Goal Formulation:  With patient Time For Goal Achievement: 05/27/18 Potential to Achieve Goals: Good  OT Frequency: Min 2X/week   Barriers to D/C:            Co-evaluation PT/OT/SLP Co-Evaluation/Treatment: Yes Reason for Co-Treatment: For patient/therapist safety;To address functional/ADL transfers   OT goals addressed during session: ADL's and self-care;Proper use of Adaptive equipment and DME      AM-PAC OT "6 Clicks" Daily Activity     Outcome Measure Help from another person eating meals?: None Help from another person taking care of personal grooming?: A Little Help from another person toileting, which includes using toliet, bedpan, or urinal?: A Lot Help from another person bathing (including washing, rinsing, drying)?: A Lot Help from another person to put on and taking off regular upper body clothing?: A Little Help from another person to put on and taking off regular lower body clothing?: A Lot 6 Click Score: 16   End of Session Equipment Utilized During Treatment: Gait belt;Rolling walker Nurse Communication: Mobility status  Activity Tolerance: Patient tolerated treatment well;Patient limited by pain Patient left: in chair;with call bell/phone within reach;with chair alarm set;with family/visitor present  OT Visit Diagnosis: Other abnormalities of gait and mobility (R26.89);Muscle weakness (generalized) (M62.81);Pain Pain - Right/Left: Right Pain - part of body: Hip;Leg                Time: 4008-6761 OT Time Calculation (min): 16 min Charges:  OT General Charges $OT Visit: 1 Visit OT Evaluation $OT Eval Moderate Complexity: [place], OT [employer] Pager [phone] Office [phone]  Raymondo Band 05/13/2018, 10:23 AM

## 2018-05-13 NOTE — Discharge Instructions (Signed)
Fall Prevention in Hospitals, Adult °Being a patient in the hospital puts you at risk for falling. Falls can cause serious injury and harm, but they can be prevented. It is important to understand what puts you at risk for falling and what you and your health care team can do to prevent you from falling. If you or a loved one falls at the hospital, it is important to tell hospital staff about it. °What increases the risk for falls? °Certain conditions and treatments may increase your risk of falling in the hospital. These include: °· Being in an unfamiliar environment, especially when using the bathroom at night. °· Having surgery. °· Being on bed rest. °· Taking many medicines or certain types of medicines, such as sleeping pills. °· Having tubes in place, such as IV lines or catheters. °Other risk factors for falls in a hospital include: °· Having difficulty with hearing or vision. °· Having a change in thinking or behavior, such as confusion. °· Having depression. °· Having trouble with balance. °· Being a male. °· Feeling dizzy. °· Needing to use the toilet frequently. °· Having fallen during the past three months. °· Having low blood pressure. °What are some strategies for preventing falls? °If you or a loved one has to stay in the hospital: °· Ask about which fall prevention strategies will be in place. Do not hesitate to speak up if you notice that the fall prevention plan has changed. °· Ask for help moving around, especially after surgery or when feeling unwell. °· If you have been asked to call for help when getting up, do not get up by yourself. Asking for help with getting up is for your safety, and the staff is there to help you. °· Wear nonskid footwear. °· Get up slowly, and sit at the side of the bed for a few minutes before standing up. °· Keep items you need, such as the nurse call button or a phone, close to you so that you do not need to reach for them. °· Wear eyeglasses or hearing aids if you  have them. °· Have someone stay in the hospital with you or your loved one. °· Ask if sleeping pills or other medicines that can cause confusion are necessary. °What does the hospital staff do to help prevent falls? °Hospitals have systems in place to prevent falls and accidents, which may involve: °· Discussing your fall risks and making a personalized fall prevention plan. °· Checking in regularly to see if you need help. °· Placing an arm band on your wrist or a sign near your room to alert other staff of your needs. °· Using an alarm on your hospital bed. This is an alarm that goes off if you get out of bed and forget to call for help. °· Keeping the bed in a low and locked position. °· Keeping the area around the bed and bathroom well-lit and free from clutter. °· Keeping your room quiet, so that you can sleep and be well-rested. °· Using safety equipment, such as: °? A belt around your waist. °? Walkers, crutches, and other devices for support. °? Safety beds, such as low beds or cushions on the floor next to the bed. °· Having a staff person stay with you (one-on-one observation), even when you are using the bathroom. This is for your safety. °· Using video monitoring. This allows a staff member to come to help you if you need help. °What other actions can I take to   lower my risk of falls?  Check in regularly with your health care provider or pharmacist to review all of the medicines that you take.  Make sure that you have a regular exercise program to stay fit. This will help you maintain your balance.  Talk with a physical therapist or trainer if recommended by your health care provider. They can help you to improve your strength, balance, and endurance.  If you are over age 78: ? Ask your health care provider if you need a calcium or vitamin D supplement. ? Have your eyes and hearing checked every year. ? Have your feet checked every year. Where to find more information You can find more  information about fall prevention from the Centers for Disease Control and Prevention: ImproveLook.cz Summary  Being in an unfamiliar environment, such as the hospital, increases your risk for falling.  If you have been asked to call for help when getting up, do not get up by yourself. Asking for help with getting up is for your safety, and the staff is there to help you.  Ask about which fall prevention strategies will be in place. Do not hesitate to speak up if you notice that the fall prevention plan has changed.  If you or a loved one falls, tell the hospital staff. This is important. This information is not intended to replace advice given to you by your health care provider. Make sure you discuss any questions you have with your health care provider. Document Released: 04/18/2000 Document Revised: 12/03/2016 Document Reviewed: 12/03/2016 Elsevier Interactive Patient Education  2019 [place].   Simple Pelvic Fracture, Adult A pelvic fracture is a break in one of the bones in the pelvis. The pelvic bones include the bones that you sit on and the bones that make up the lower part of your spine. A pelvic fracture is called simple if:  There is only one break.  The broken bone is stable.  The broken bone is not moving out of place.  The bone does not pierce the skin. A pelvic fracture may occur along with injuries to nerves, blood vessels, soft tissues, the urinary tract, and abdominal organs. What are the causes? Common causes of this type of fracture include:  A fall.  A car accident.  Force or pressure that hits the pelvis. What increases the risk? You are more likely to get this injury if you:  Play high-impact sports, such as rugby or football.  You have thinning or weakening of your bones, such as from osteopenia or osteoporosis.  Have cancer that has spread to the bone.  Have a condition that is associated with falling, such as Parkinson's disease or  seizure.  Have had a stroke.  Smoke. What are the signs or symptoms? Signs and symptoms may include:  Tenderness, swelling, or bruising in the affected area.  Pain when moving the hip.  Pain when walking or standing. How is this diagnosed? This condition is diagnosed with a physical exam, X-ray, or CT scan. You may also have blood or urine tests:  To rule out damage to other organs, such as the urethra.  To check for internal bleeding in the pelvic area. How is this treated? The goal of treatment is to get the bone to heal in its original position. Treatment includes:  Staying in bed (bed rest).  Using crutches, a walker, or a wheelchair until the bone heals.  Medicines to treat pain.  Medicines to prevent blood clots from forming in  your legs.  Physical therapy. Follow these instructions at home: Medicines  Take over-the-counter and prescription medicines only as told by your health care provider.  Do not drive or use heavy machinery while taking prescription pain medicine. Managing pain, stiffness, and swelling   If directed, apply ice to the injured area: ? Put ice in a plastic bag. ? Place a towel between your skin and the bag. ? Leave the ice on for 20 minutes, 2-3 times a day.  Gently move your toes often to avoid stiffness and to lessen swelling. Activity  Stay on bed rest for as long as directed by your health care provider.  While on bed rest: ? Change the position of your legs every 1-2 hours. This keeps blood moving well through both of your legs. ? You may sit for as long as you feel comfortable.  After bed rest: ? Avoid strenuous activities for as long as directed by your health care provider. ? Return to your normal activities as directed by your health care provider. Ask your health care provider what activities are safe for you.  Use items to help you with your activities, such as: ? A long-handled shoehorn to help you put your shoes  on. ? Elastic shoelaces that do not need to be retied. ? A reacher or grabber to pick items up off the floor. General instructions   Do not  drive or operate heavy machinery until your health care provider tells you it is safe to do so.  Use a wheelchair or assistive devices as directed by your health care provider. When you are ready to walk, start by using crutches or a walker to help support your body weight.  Have someone help you at home as you recover.  Wear compression stockings as told by your health care provider.  Do not use any products that contain nicotine or tobacco, such as cigarettes and e-cigarettes. These can delay bone healing. If you need help quitting, ask your health care provider.  If you have an underlying condition that caused your pelvic fracture, work with your health care provider to manage your condition.  Keep all follow-up visits as told by your health care provider. This is important. Contact a health care provider if:  Your pain gets worse.  Your pain is not relieved with medicines. Get help right away if you:  Feel light-headed or faint.  Develop chest pain.  Develop shortness of breath.  Have a fever.  Have blood in your urine or your stools.  Have bleeding in your vagina.  Have difficulty or pain with urination or with passing stool.  Have difficulty or increased pain with walking.  Have new or increased swelling in one of your legs.  Have numbness in your legs or groin area. Summary  A pelvic fracture is a break in one of the bones in the pelvis. These are the bones that you sit on and the bones that make up the lower part of your spine.  A pelvic fracture is called simple if there is only one break, the broken bone is stable, the broken bone is not moving out of place, or the bone does not pierce the skin.  Common causes of this type of fracture include a fall, a car accident, or a force or pressure that hits the pelvis.  The  goal of treatment is to get the bone to heal in its original position.  Treatment includes bed rest and using a wheelchair. When ready  to walk, you may use crutches or a walker until your bone heals. Other treatments include physical therapy and medicine to treat pain and prevent blood clots. This information is not intended to replace advice given to you by your health care provider. Make sure you discuss any questions you have with your health care provider. Document Released: 06/30/2001 Document Revised: 11/26/2017 Document Reviewed: 06/01/2017 Elsevier Interactive Patient Education  2019 [employer].

## 2018-05-15 DIAGNOSIS — D473 Essential (hemorrhagic) thrombocythemia: Secondary | ICD-10-CM | POA: Diagnosis not present

## 2018-05-20 ENCOUNTER — Other Ambulatory Visit: Payer: Self-pay | Admitting: *Deleted

## 2018-05-20 NOTE — Patient Outreach (Addendum)
[name]) Care Management  05/20/2018  Alexander [name], Dr. February 10, 1922 [id number]    Attended interdisciplinary team meeting at [place] with [facility] UM team member [name] to discuss patient's progress and plan for transitioning to home. Patient was admitted to SNF post hospital admit after a fall resulting in hip/pelvis fractures that did not require surgical intervention.  PT states patient is moderate assist with a rolling walker able to walker 6ft.  Nursing states patient rating pain 5/10 when weight bearing. PT states patient lives in a split level home but bedroom and bathroom on the first floor.   Visited patient at the bedside.  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 Patient stated he was doing well with therapy but stated he as still having pain with movement.  Patient stated he does have family for support but lives alone.  Patient stated he spent most of his time with his girlfriend, now caring for her related to she has the beginning stages of Alzheimer's.  Patient stated he would like to get back to the place where he could continue driving because he normally ate out for meals.  Introduced Psychologist, forensic and gave patient [facility] packet with contact information.  Patient agreed to [facility] services at discharge from SNF.  Patient stated he would like to speak with [facility] about resources for meal delivery once home.  Patient stated he does not have issues with medication management or cost.  Patient stated he has transportation for appointments.  Patient gave [phone] as his home number.   Referral sent for [place] care manager to engage for transition of care and [facility] LCSW to assist with resources for meal delivery.   [facility] UM team member aware of [facility] CM referral.   Alexander Trebilcock RN, [place] Acute Care Coordinator [phone]) Business Mobile [phone]) Toll free office

## 2018-05-28 ENCOUNTER — Other Ambulatory Visit: Payer: Self-pay | Admitting: *Deleted

## 2018-05-28 NOTE — Patient Outreach (Signed)
[place] [facility]) Care Management  05/28/2018  Alexander [name], Dr. 24-Oct-1921 [id number]   CSW was able to meet pt at [facility] where he confirmed his identity. CSW introduced self,role and reason for visit. Pt working with PT at time of my visit- he states he plans to go to [place] (senior living and ALF) this weekend. He plans to not return to his home where he was living prior to fall.  CSW talked with pt who is quite oriented, indpendent (pta) and motivated to get strong and back to his "ROME[employer]" group- which he tells me is "[employer] [place]".   CSW provided pt with [facility] Welcome packet and will plan a f/u call next week for further assessment and support.    Eduard Clos, MSW, [place] Worker  [place] [phone]

## 2018-05-29 DIAGNOSIS — S32509A Unspecified fracture of unspecified pubis, initial encounter for closed fracture: Secondary | ICD-10-CM | POA: Diagnosis not present

## 2018-05-29 DIAGNOSIS — I10 Essential (primary) hypertension: Secondary | ICD-10-CM | POA: Diagnosis not present

## 2018-05-29 DIAGNOSIS — E785 Hyperlipidemia, unspecified: Secondary | ICD-10-CM | POA: Diagnosis not present

## 2018-05-29 DIAGNOSIS — N183 Chronic kidney disease, stage 3 (moderate): Secondary | ICD-10-CM | POA: Diagnosis not present

## 2018-05-29 DIAGNOSIS — I351 Nonrheumatic aortic (valve) insufficiency: Secondary | ICD-10-CM | POA: Diagnosis not present

## 2018-06-02 DIAGNOSIS — Z9181 History of falling: Secondary | ICD-10-CM | POA: Diagnosis not present

## 2018-06-02 DIAGNOSIS — M6281 Muscle weakness (generalized): Secondary | ICD-10-CM | POA: Diagnosis not present

## 2018-06-02 DIAGNOSIS — M25551 Pain in right hip: Secondary | ICD-10-CM | POA: Diagnosis not present

## 2018-06-02 DIAGNOSIS — R2681 Unsteadiness on feet: Secondary | ICD-10-CM | POA: Diagnosis not present

## 2018-06-02 DIAGNOSIS — R278 Other lack of coordination: Secondary | ICD-10-CM | POA: Diagnosis not present

## 2018-06-02 DIAGNOSIS — R296 Repeated falls: Secondary | ICD-10-CM | POA: Diagnosis not present

## 2018-06-03 ENCOUNTER — Ambulatory Visit: Payer: Self-pay | Admitting: *Deleted

## 2018-06-03 ENCOUNTER — Other Ambulatory Visit: Payer: Self-pay | Admitting: *Deleted

## 2018-06-03 DIAGNOSIS — R278 Other lack of coordination: Secondary | ICD-10-CM | POA: Diagnosis not present

## 2018-06-03 DIAGNOSIS — R2681 Unsteadiness on feet: Secondary | ICD-10-CM | POA: Diagnosis not present

## 2018-06-03 DIAGNOSIS — M25551 Pain in right hip: Secondary | ICD-10-CM | POA: Diagnosis not present

## 2018-06-03 DIAGNOSIS — Z9181 History of falling: Secondary | ICD-10-CM | POA: Diagnosis not present

## 2018-06-03 DIAGNOSIS — M6281 Muscle weakness (generalized): Secondary | ICD-10-CM | POA: Diagnosis not present

## 2018-06-03 DIAGNOSIS — R296 Repeated falls: Secondary | ICD-10-CM | POA: Diagnosis not present

## 2018-06-03 NOTE — Patient Outreach (Signed)
[facility]) Care Management  06/03/2018  [name], Dr. May 12, 1921 [id number]   Referral received from [place] as member was recently admitted to hospital after a fall, discharged to SNF for rehab.  He was discharged for SNF over he weekend.  Per chart, he has history of hypertension, migraines, CAD, GERD, Aortic valve insufficiency, peripheral neuropathy, CKD, BPH, and hyperlipidemia.  Call placed to member to initiate transition of care program, no answer.  HIPAA compliant voice message left.  Unsuccessful outreach letter sent, will follow up within the next 4 business days.  Valente David, [place], MSN [place] [phone]

## 2018-06-04 ENCOUNTER — Ambulatory Visit: Payer: Self-pay | Admitting: *Deleted

## 2018-06-04 ENCOUNTER — Other Ambulatory Visit: Payer: Self-pay | Admitting: *Deleted

## 2018-06-04 DIAGNOSIS — R278 Other lack of coordination: Secondary | ICD-10-CM | POA: Diagnosis not present

## 2018-06-04 DIAGNOSIS — R296 Repeated falls: Secondary | ICD-10-CM | POA: Diagnosis not present

## 2018-06-04 DIAGNOSIS — M6281 Muscle weakness (generalized): Secondary | ICD-10-CM | POA: Diagnosis not present

## 2018-06-04 DIAGNOSIS — R2681 Unsteadiness on feet: Secondary | ICD-10-CM | POA: Diagnosis not present

## 2018-06-04 DIAGNOSIS — Z9181 History of falling: Secondary | ICD-10-CM | POA: Diagnosis not present

## 2018-06-04 DIAGNOSIS — M25551 Pain in right hip: Secondary | ICD-10-CM | POA: Diagnosis not present

## 2018-06-04 NOTE — Patient Outreach (Signed)
[place] [facility]) Care Management  06/04/2018  Alexander [name], Dr. [birth year] [id number]   CSW attempted to reach pt by phone on 06/03/2018 for follow up on his planned move to [place] and left a message for return call. CSW will plan a visit to ALF today.   Eduard Clos, MSW, [place] Worker  [place] [phone]

## 2018-06-07 ENCOUNTER — Other Ambulatory Visit: Payer: Self-pay | Admitting: *Deleted

## 2018-06-07 DIAGNOSIS — R2681 Unsteadiness on feet: Secondary | ICD-10-CM | POA: Diagnosis not present

## 2018-06-07 DIAGNOSIS — Z9181 History of falling: Secondary | ICD-10-CM | POA: Diagnosis not present

## 2018-06-07 DIAGNOSIS — R278 Other lack of coordination: Secondary | ICD-10-CM | POA: Diagnosis not present

## 2018-06-07 DIAGNOSIS — R296 Repeated falls: Secondary | ICD-10-CM | POA: Diagnosis not present

## 2018-06-07 DIAGNOSIS — M25551 Pain in right hip: Secondary | ICD-10-CM | POA: Diagnosis not present

## 2018-06-07 DIAGNOSIS — M6281 Muscle weakness (generalized): Secondary | ICD-10-CM | POA: Diagnosis not present

## 2018-06-07 NOTE — Patient Outreach (Signed)
[facility]) Care Management  06/07/2018  [name], Dr. Oct 02, 1921 [id number]   CSW met with pt at [facility] where he has moved for continued rehab post SNF. 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  Pt had inquired with CSW about getting involved with a Tai Chi program and delivered info today about [facility] where they offer [place] as well as other senior activities. He also is hoping to get home and back to driving as pta and is open to receiving info about [facility] transportation also.   CSW will plan a f/u call or visit next week to provide additional resources as requested.    Eduard Clos, MSW, [place] Worker  [place] [phone]

## 2018-06-08 DIAGNOSIS — M6281 Muscle weakness (generalized): Secondary | ICD-10-CM | POA: Diagnosis not present

## 2018-06-08 DIAGNOSIS — R296 Repeated falls: Secondary | ICD-10-CM | POA: Diagnosis not present

## 2018-06-08 DIAGNOSIS — R2681 Unsteadiness on feet: Secondary | ICD-10-CM | POA: Diagnosis not present

## 2018-06-08 DIAGNOSIS — R278 Other lack of coordination: Secondary | ICD-10-CM | POA: Diagnosis not present

## 2018-06-08 DIAGNOSIS — M25551 Pain in right hip: Secondary | ICD-10-CM | POA: Diagnosis not present

## 2018-06-08 DIAGNOSIS — Z9181 History of falling: Secondary | ICD-10-CM | POA: Diagnosis not present

## 2018-06-09 ENCOUNTER — Other Ambulatory Visit: Payer: Self-pay | Admitting: *Deleted

## 2018-06-09 DIAGNOSIS — Z9181 History of falling: Secondary | ICD-10-CM | POA: Diagnosis not present

## 2018-06-09 DIAGNOSIS — M6281 Muscle weakness (generalized): Secondary | ICD-10-CM | POA: Diagnosis not present

## 2018-06-09 DIAGNOSIS — R296 Repeated falls: Secondary | ICD-10-CM | POA: Diagnosis not present

## 2018-06-09 DIAGNOSIS — M25551 Pain in right hip: Secondary | ICD-10-CM | POA: Diagnosis not present

## 2018-06-09 DIAGNOSIS — R2681 Unsteadiness on feet: Secondary | ICD-10-CM | POA: Diagnosis not present

## 2018-06-09 DIAGNOSIS — R278 Other lack of coordination: Secondary | ICD-10-CM | POA: Diagnosis not present

## 2018-06-09 NOTE — Patient Outreach (Signed)
[name]) Care Management  06/09/2018  Alexander [name], Dr. 07-12-1921 [id number]   Noted per CSW note that member remains admitted to ALF for extended rehab with plan to return home.  Will await discharge home to complete transition of care.  Follow up with member pending discharge.  Valente David, [place], MSN [place] [phone]

## 2018-06-10 DIAGNOSIS — R2681 Unsteadiness on feet: Secondary | ICD-10-CM | POA: Diagnosis not present

## 2018-06-10 DIAGNOSIS — M25551 Pain in right hip: Secondary | ICD-10-CM | POA: Diagnosis not present

## 2018-06-10 DIAGNOSIS — R296 Repeated falls: Secondary | ICD-10-CM | POA: Diagnosis not present

## 2018-06-10 DIAGNOSIS — Z9181 History of falling: Secondary | ICD-10-CM | POA: Diagnosis not present

## 2018-06-10 DIAGNOSIS — R278 Other lack of coordination: Secondary | ICD-10-CM | POA: Diagnosis not present

## 2018-06-10 DIAGNOSIS — M6281 Muscle weakness (generalized): Secondary | ICD-10-CM | POA: Diagnosis not present

## 2018-06-11 DIAGNOSIS — R2681 Unsteadiness on feet: Secondary | ICD-10-CM | POA: Diagnosis not present

## 2018-06-11 DIAGNOSIS — Z9181 History of falling: Secondary | ICD-10-CM | POA: Diagnosis not present

## 2018-06-11 DIAGNOSIS — M25551 Pain in right hip: Secondary | ICD-10-CM | POA: Diagnosis not present

## 2018-06-11 DIAGNOSIS — M6281 Muscle weakness (generalized): Secondary | ICD-10-CM | POA: Diagnosis not present

## 2018-06-11 DIAGNOSIS — R278 Other lack of coordination: Secondary | ICD-10-CM | POA: Diagnosis not present

## 2018-06-11 DIAGNOSIS — R296 Repeated falls: Secondary | ICD-10-CM | POA: Diagnosis not present

## 2018-06-14 DIAGNOSIS — S329XXA Fracture of unspecified parts of lumbosacral spine and pelvis, initial encounter for closed fracture: Secondary | ICD-10-CM | POA: Diagnosis not present

## 2018-06-14 DIAGNOSIS — I2510 Atherosclerotic heart disease of native coronary artery without angina pectoris: Secondary | ICD-10-CM | POA: Diagnosis not present

## 2018-06-14 DIAGNOSIS — M810 Age-related osteoporosis without current pathological fracture: Secondary | ICD-10-CM | POA: Diagnosis not present

## 2018-06-14 DIAGNOSIS — E038 Other specified hypothyroidism: Secondary | ICD-10-CM | POA: Diagnosis not present

## 2018-06-14 DIAGNOSIS — R2689 Other abnormalities of gait and mobility: Secondary | ICD-10-CM | POA: Diagnosis not present

## 2018-06-15 DIAGNOSIS — R296 Repeated falls: Secondary | ICD-10-CM | POA: Diagnosis not present

## 2018-06-15 DIAGNOSIS — R278 Other lack of coordination: Secondary | ICD-10-CM | POA: Diagnosis not present

## 2018-06-15 DIAGNOSIS — Z9181 History of falling: Secondary | ICD-10-CM | POA: Diagnosis not present

## 2018-06-15 DIAGNOSIS — M25551 Pain in right hip: Secondary | ICD-10-CM | POA: Diagnosis not present

## 2018-06-15 DIAGNOSIS — R2681 Unsteadiness on feet: Secondary | ICD-10-CM | POA: Diagnosis not present

## 2018-06-15 DIAGNOSIS — M6281 Muscle weakness (generalized): Secondary | ICD-10-CM | POA: Diagnosis not present

## 2018-06-16 DIAGNOSIS — R2681 Unsteadiness on feet: Secondary | ICD-10-CM | POA: Diagnosis not present

## 2018-06-16 DIAGNOSIS — R296 Repeated falls: Secondary | ICD-10-CM | POA: Diagnosis not present

## 2018-06-16 DIAGNOSIS — M25551 Pain in right hip: Secondary | ICD-10-CM | POA: Diagnosis not present

## 2018-06-16 DIAGNOSIS — M6281 Muscle weakness (generalized): Secondary | ICD-10-CM | POA: Diagnosis not present

## 2018-06-16 DIAGNOSIS — R278 Other lack of coordination: Secondary | ICD-10-CM | POA: Diagnosis not present

## 2018-06-16 DIAGNOSIS — Z9181 History of falling: Secondary | ICD-10-CM | POA: Diagnosis not present

## 2018-06-17 DIAGNOSIS — Z9181 History of falling: Secondary | ICD-10-CM | POA: Diagnosis not present

## 2018-06-17 DIAGNOSIS — R278 Other lack of coordination: Secondary | ICD-10-CM | POA: Diagnosis not present

## 2018-06-17 DIAGNOSIS — M6281 Muscle weakness (generalized): Secondary | ICD-10-CM | POA: Diagnosis not present

## 2018-06-17 DIAGNOSIS — R2681 Unsteadiness on feet: Secondary | ICD-10-CM | POA: Diagnosis not present

## 2018-06-17 DIAGNOSIS — R296 Repeated falls: Secondary | ICD-10-CM | POA: Diagnosis not present

## 2018-06-17 DIAGNOSIS — M25551 Pain in right hip: Secondary | ICD-10-CM | POA: Diagnosis not present

## 2018-06-18 DIAGNOSIS — M25551 Pain in right hip: Secondary | ICD-10-CM | POA: Diagnosis not present

## 2018-06-18 DIAGNOSIS — R296 Repeated falls: Secondary | ICD-10-CM | POA: Diagnosis not present

## 2018-06-18 DIAGNOSIS — M6281 Muscle weakness (generalized): Secondary | ICD-10-CM | POA: Diagnosis not present

## 2018-06-18 DIAGNOSIS — R2681 Unsteadiness on feet: Secondary | ICD-10-CM | POA: Diagnosis not present

## 2018-06-18 DIAGNOSIS — Z9181 History of falling: Secondary | ICD-10-CM | POA: Diagnosis not present

## 2018-06-18 DIAGNOSIS — R278 Other lack of coordination: Secondary | ICD-10-CM | POA: Diagnosis not present

## 2018-06-21 DIAGNOSIS — M6281 Muscle weakness (generalized): Secondary | ICD-10-CM | POA: Diagnosis not present

## 2018-06-21 DIAGNOSIS — M25551 Pain in right hip: Secondary | ICD-10-CM | POA: Diagnosis not present

## 2018-06-21 DIAGNOSIS — R278 Other lack of coordination: Secondary | ICD-10-CM | POA: Diagnosis not present

## 2018-06-21 DIAGNOSIS — R296 Repeated falls: Secondary | ICD-10-CM | POA: Diagnosis not present

## 2018-06-21 DIAGNOSIS — Z9181 History of falling: Secondary | ICD-10-CM | POA: Diagnosis not present

## 2018-06-21 DIAGNOSIS — R2681 Unsteadiness on feet: Secondary | ICD-10-CM | POA: Diagnosis not present

## 2018-06-22 DIAGNOSIS — M25551 Pain in right hip: Secondary | ICD-10-CM | POA: Diagnosis not present

## 2018-06-22 DIAGNOSIS — R278 Other lack of coordination: Secondary | ICD-10-CM | POA: Diagnosis not present

## 2018-06-22 DIAGNOSIS — M6281 Muscle weakness (generalized): Secondary | ICD-10-CM | POA: Diagnosis not present

## 2018-06-22 DIAGNOSIS — R2681 Unsteadiness on feet: Secondary | ICD-10-CM | POA: Diagnosis not present

## 2018-06-22 DIAGNOSIS — Z9181 History of falling: Secondary | ICD-10-CM | POA: Diagnosis not present

## 2018-06-22 DIAGNOSIS — R296 Repeated falls: Secondary | ICD-10-CM | POA: Diagnosis not present

## 2018-06-23 DIAGNOSIS — R2681 Unsteadiness on feet: Secondary | ICD-10-CM | POA: Diagnosis not present

## 2018-06-23 DIAGNOSIS — R296 Repeated falls: Secondary | ICD-10-CM | POA: Diagnosis not present

## 2018-06-23 DIAGNOSIS — M25551 Pain in right hip: Secondary | ICD-10-CM | POA: Diagnosis not present

## 2018-06-23 DIAGNOSIS — M6281 Muscle weakness (generalized): Secondary | ICD-10-CM | POA: Diagnosis not present

## 2018-06-23 DIAGNOSIS — Z9181 History of falling: Secondary | ICD-10-CM | POA: Diagnosis not present

## 2018-06-23 DIAGNOSIS — R278 Other lack of coordination: Secondary | ICD-10-CM | POA: Diagnosis not present

## 2018-06-24 DIAGNOSIS — R296 Repeated falls: Secondary | ICD-10-CM | POA: Diagnosis not present

## 2018-06-24 DIAGNOSIS — H353231 Exudative age-related macular degeneration, bilateral, with active choroidal neovascularization: Secondary | ICD-10-CM | POA: Diagnosis not present

## 2018-06-24 DIAGNOSIS — M25551 Pain in right hip: Secondary | ICD-10-CM | POA: Diagnosis not present

## 2018-06-24 DIAGNOSIS — R278 Other lack of coordination: Secondary | ICD-10-CM | POA: Diagnosis not present

## 2018-06-24 DIAGNOSIS — R2681 Unsteadiness on feet: Secondary | ICD-10-CM | POA: Diagnosis not present

## 2018-06-24 DIAGNOSIS — Z9181 History of falling: Secondary | ICD-10-CM | POA: Diagnosis not present

## 2018-06-24 DIAGNOSIS — M6281 Muscle weakness (generalized): Secondary | ICD-10-CM | POA: Diagnosis not present

## 2018-06-25 DIAGNOSIS — M25551 Pain in right hip: Secondary | ICD-10-CM | POA: Diagnosis not present

## 2018-06-25 DIAGNOSIS — R278 Other lack of coordination: Secondary | ICD-10-CM | POA: Diagnosis not present

## 2018-06-25 DIAGNOSIS — M6281 Muscle weakness (generalized): Secondary | ICD-10-CM | POA: Diagnosis not present

## 2018-06-25 DIAGNOSIS — Z9181 History of falling: Secondary | ICD-10-CM | POA: Diagnosis not present

## 2018-06-25 DIAGNOSIS — R2681 Unsteadiness on feet: Secondary | ICD-10-CM | POA: Diagnosis not present

## 2018-06-25 DIAGNOSIS — R296 Repeated falls: Secondary | ICD-10-CM | POA: Diagnosis not present

## 2018-06-28 ENCOUNTER — Encounter: Payer: Self-pay | Admitting: *Deleted

## 2018-06-28 DIAGNOSIS — M6281 Muscle weakness (generalized): Secondary | ICD-10-CM | POA: Diagnosis not present

## 2018-06-28 DIAGNOSIS — Z9181 History of falling: Secondary | ICD-10-CM | POA: Diagnosis not present

## 2018-06-28 DIAGNOSIS — R2681 Unsteadiness on feet: Secondary | ICD-10-CM | POA: Diagnosis not present

## 2018-06-28 DIAGNOSIS — R296 Repeated falls: Secondary | ICD-10-CM | POA: Diagnosis not present

## 2018-06-28 DIAGNOSIS — R278 Other lack of coordination: Secondary | ICD-10-CM | POA: Diagnosis not present

## 2018-06-28 DIAGNOSIS — M25551 Pain in right hip: Secondary | ICD-10-CM | POA: Diagnosis not present

## 2018-06-29 DIAGNOSIS — R278 Other lack of coordination: Secondary | ICD-10-CM | POA: Diagnosis not present

## 2018-06-29 DIAGNOSIS — M25551 Pain in right hip: Secondary | ICD-10-CM | POA: Diagnosis not present

## 2018-06-29 DIAGNOSIS — R296 Repeated falls: Secondary | ICD-10-CM | POA: Diagnosis not present

## 2018-06-29 DIAGNOSIS — Z9181 History of falling: Secondary | ICD-10-CM | POA: Diagnosis not present

## 2018-06-29 DIAGNOSIS — M6281 Muscle weakness (generalized): Secondary | ICD-10-CM | POA: Diagnosis not present

## 2018-06-29 DIAGNOSIS — R2681 Unsteadiness on feet: Secondary | ICD-10-CM | POA: Diagnosis not present

## 2018-06-30 DIAGNOSIS — M6281 Muscle weakness (generalized): Secondary | ICD-10-CM | POA: Diagnosis not present

## 2018-06-30 DIAGNOSIS — Z9181 History of falling: Secondary | ICD-10-CM | POA: Diagnosis not present

## 2018-06-30 DIAGNOSIS — R296 Repeated falls: Secondary | ICD-10-CM | POA: Diagnosis not present

## 2018-06-30 DIAGNOSIS — M25551 Pain in right hip: Secondary | ICD-10-CM | POA: Diagnosis not present

## 2018-06-30 DIAGNOSIS — R2681 Unsteadiness on feet: Secondary | ICD-10-CM | POA: Diagnosis not present

## 2018-06-30 DIAGNOSIS — R278 Other lack of coordination: Secondary | ICD-10-CM | POA: Diagnosis not present

## 2018-07-01 DIAGNOSIS — M25551 Pain in right hip: Secondary | ICD-10-CM | POA: Diagnosis not present

## 2018-07-01 DIAGNOSIS — R296 Repeated falls: Secondary | ICD-10-CM | POA: Diagnosis not present

## 2018-07-01 DIAGNOSIS — R278 Other lack of coordination: Secondary | ICD-10-CM | POA: Diagnosis not present

## 2018-07-01 DIAGNOSIS — Z9181 History of falling: Secondary | ICD-10-CM | POA: Diagnosis not present

## 2018-07-01 DIAGNOSIS — R2681 Unsteadiness on feet: Secondary | ICD-10-CM | POA: Diagnosis not present

## 2018-07-01 DIAGNOSIS — M6281 Muscle weakness (generalized): Secondary | ICD-10-CM | POA: Diagnosis not present

## 2018-07-02 DIAGNOSIS — R2681 Unsteadiness on feet: Secondary | ICD-10-CM | POA: Diagnosis not present

## 2018-07-02 DIAGNOSIS — R278 Other lack of coordination: Secondary | ICD-10-CM | POA: Diagnosis not present

## 2018-07-02 DIAGNOSIS — M6281 Muscle weakness (generalized): Secondary | ICD-10-CM | POA: Diagnosis not present

## 2018-07-02 DIAGNOSIS — Z9181 History of falling: Secondary | ICD-10-CM | POA: Diagnosis not present

## 2018-07-02 DIAGNOSIS — M25551 Pain in right hip: Secondary | ICD-10-CM | POA: Diagnosis not present

## 2018-07-02 DIAGNOSIS — R296 Repeated falls: Secondary | ICD-10-CM | POA: Diagnosis not present

## 2018-07-05 DIAGNOSIS — R296 Repeated falls: Secondary | ICD-10-CM | POA: Diagnosis not present

## 2018-07-05 DIAGNOSIS — R278 Other lack of coordination: Secondary | ICD-10-CM | POA: Diagnosis not present

## 2018-07-05 DIAGNOSIS — M25551 Pain in right hip: Secondary | ICD-10-CM | POA: Diagnosis not present

## 2018-07-05 DIAGNOSIS — M6281 Muscle weakness (generalized): Secondary | ICD-10-CM | POA: Diagnosis not present

## 2018-07-05 DIAGNOSIS — Z9181 History of falling: Secondary | ICD-10-CM | POA: Diagnosis not present

## 2018-07-05 DIAGNOSIS — R2681 Unsteadiness on feet: Secondary | ICD-10-CM | POA: Diagnosis not present

## 2018-07-06 DIAGNOSIS — M810 Age-related osteoporosis without current pathological fracture: Secondary | ICD-10-CM | POA: Diagnosis not present

## 2018-07-07 DIAGNOSIS — M25551 Pain in right hip: Secondary | ICD-10-CM | POA: Diagnosis not present

## 2018-07-07 DIAGNOSIS — R296 Repeated falls: Secondary | ICD-10-CM | POA: Diagnosis not present

## 2018-07-07 DIAGNOSIS — R2681 Unsteadiness on feet: Secondary | ICD-10-CM | POA: Diagnosis not present

## 2018-07-07 DIAGNOSIS — M6281 Muscle weakness (generalized): Secondary | ICD-10-CM | POA: Diagnosis not present

## 2018-07-07 DIAGNOSIS — Z9181 History of falling: Secondary | ICD-10-CM | POA: Diagnosis not present

## 2018-07-07 DIAGNOSIS — R278 Other lack of coordination: Secondary | ICD-10-CM | POA: Diagnosis not present

## 2018-07-08 DIAGNOSIS — R296 Repeated falls: Secondary | ICD-10-CM | POA: Diagnosis not present

## 2018-07-08 DIAGNOSIS — Z9181 History of falling: Secondary | ICD-10-CM | POA: Diagnosis not present

## 2018-07-08 DIAGNOSIS — M25551 Pain in right hip: Secondary | ICD-10-CM | POA: Diagnosis not present

## 2018-07-08 DIAGNOSIS — M6281 Muscle weakness (generalized): Secondary | ICD-10-CM | POA: Diagnosis not present

## 2018-07-08 DIAGNOSIS — R278 Other lack of coordination: Secondary | ICD-10-CM | POA: Diagnosis not present

## 2018-07-08 DIAGNOSIS — R2681 Unsteadiness on feet: Secondary | ICD-10-CM | POA: Diagnosis not present

## 2018-07-12 ENCOUNTER — Encounter (HOSPITAL_COMMUNITY): Payer: Self-pay

## 2018-07-12 ENCOUNTER — Encounter (HOSPITAL_COMMUNITY)
Admission: RE | Admit: 2018-07-12 | Discharge: 2018-07-12 | Disposition: A | Discharge status: Home / Self Care | Payer: Medicare Other | Source: Ambulatory Visit | Attending: Internal Medicine | Admitting: Internal Medicine

## 2018-07-12 DIAGNOSIS — M810 Age-related osteoporosis without current pathological fracture: Secondary | ICD-10-CM | POA: Diagnosis not present

## 2018-07-12 DIAGNOSIS — Z9181 History of falling: Secondary | ICD-10-CM | POA: Diagnosis not present

## 2018-07-12 DIAGNOSIS — R278 Other lack of coordination: Secondary | ICD-10-CM | POA: Diagnosis not present

## 2018-07-12 DIAGNOSIS — M6281 Muscle weakness (generalized): Secondary | ICD-10-CM | POA: Diagnosis not present

## 2018-07-12 DIAGNOSIS — R2681 Unsteadiness on feet: Secondary | ICD-10-CM | POA: Diagnosis not present

## 2018-07-12 DIAGNOSIS — R296 Repeated falls: Secondary | ICD-10-CM | POA: Diagnosis not present

## 2018-07-12 DIAGNOSIS — M25551 Pain in right hip: Secondary | ICD-10-CM | POA: Diagnosis not present

## 2018-07-12 MED ORDER — DENOSUMAB 60 MG/ML ~~LOC~~ SOSY
60.0000 mg | PREFILLED_SYRINGE | SUBCUTANEOUS | Status: DC
  Administered 2018-07-12: 60 mg via SUBCUTANEOUS
  Filled 2018-07-12: qty 1

## 2018-07-12 NOTE — Discharge Instructions (Signed)
Denosumab injection °What is this medicine? °DENOSUMAB (den oh sue mab) slows bone breakdown. Prolia is used to treat osteoporosis in women after menopause and in men, and in people who are taking corticosteroids for 6 months or more. Xgeva is used to treat a high calcium level due to cancer and to prevent bone fractures and other bone problems caused by multiple myeloma or cancer bone metastases. Xgeva is also used to treat giant cell tumor of the bone. °This medicine may be used for other purposes; ask your health care provider or pharmacist if you have questions. °COMMON BRAND NAME(S): Prolia, XGEVA °What should I tell my health care provider before I take this medicine? °They need to know if you have any of these conditions: °-dental disease °-having surgery or tooth extraction °-infection °-kidney disease °-low levels of calcium or Vitamin D in the blood °-malnutrition °-on hemodialysis °-skin conditions or sensitivity °-thyroid or parathyroid disease °-an unusual reaction to denosumab, other medicines, foods, dyes, or preservatives °-pregnant or trying to get pregnant °-breast-feeding °How should I use this medicine? °This medicine is for injection under the skin. It is given by a health care professional in a hospital or clinic setting. °A special MedGuide will be given to you before each treatment. Be sure to read this information carefully each time. °For Prolia, talk to your pediatrician regarding the use of this medicine in children. Special care may be needed. For Xgeva, talk to your pediatrician regarding the use of this medicine in children. While this drug may be prescribed for children as young as 13 years for selected conditions, precautions do apply. °Overdosage: If you think you have taken too much of this medicine contact a poison control center or emergency room at once. °NOTE: This medicine is only for you. Do not share this medicine with others. °What if I miss a dose? °It is important not to  miss your dose. Call your doctor or health care professional if you are unable to keep an appointment. °What may interact with this medicine? °Do not take this medicine with any of the following medications: °-other medicines containing denosumab °This medicine may also interact with the following medications: °-medicines that lower your chance of fighting infection °-steroid medicines like prednisone or cortisone °This list may not describe all possible interactions. Give your health care provider a list of all the medicines, herbs, non-prescription drugs, or dietary supplements you use. Also tell them if you smoke, drink alcohol, or use illegal drugs. Some items may interact with your medicine. °What should I watch for while using this medicine? °Visit your doctor or health care professional for regular checks on your progress. Your doctor or health care professional may order blood tests and other tests to see how you are doing. °Call your doctor or health care professional for advice if you get a fever, chills or sore throat, or other symptoms of a cold or flu. Do not treat yourself. This drug may decrease your body's ability to fight infection. Try to avoid being around people who are sick. °You should make sure you get enough calcium and vitamin D while you are taking this medicine, unless your doctor tells you not to. Discuss the foods you eat and the vitamins you take with your health care professional. °See your dentist regularly. Brush and floss your teeth as directed. Before you have any dental work done, tell your dentist you are receiving this medicine. °Do not become pregnant while taking this medicine or for 5 months   after stopping it. Talk with your doctor or health care professional about your birth control options while taking this medicine. Women should inform their doctor if they wish to become pregnant or think they might be pregnant. There is a potential for serious side effects to an unborn  child. Talk to your health care professional or pharmacist for more information. °What side effects may I notice from receiving this medicine? °Side effects that you should report to your doctor or health care professional as soon as possible: °-allergic reactions like skin rash, itching or hives, swelling of the face, lips, or tongue °-bone pain °-breathing problems °-dizziness °-jaw pain, especially after dental work °-redness, blistering, peeling of the skin °-signs and symptoms of infection like fever or chills; cough; sore throat; pain or trouble passing urine °-signs of low calcium like fast heartbeat, muscle cramps or muscle pain; pain, tingling, numbness in the hands or feet; seizures °-unusual bleeding or bruising °-unusually weak or tired °Side effects that usually do not require medical attention (report to your doctor or health care professional if they continue or are bothersome): °-constipation °-diarrhea °-headache °-joint pain °-loss of appetite °-muscle pain °-runny nose °-tiredness °-upset stomach °This list may not describe all possible side effects. Call your doctor for medical advice about side effects. You may report side effects to FDA at 1-800-FDA-1088. °Where should I keep my medicine? °This medicine is only given in a clinic, doctor's office, or other health care setting and will not be stored at home. °NOTE: This sheet is a summary. It may not cover all possible information. If you have questions about this medicine, talk to your doctor, pharmacist, or health care provider. °© 2019 Elsevier/Gold Standard (2017-08-28 16:10:44) ° °

## 2018-07-12 NOTE — Progress Notes (Signed)
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  Also advised of next appointment 01/13/2019.  No issues with injection.

## 2018-07-13 DIAGNOSIS — Z9181 History of falling: Secondary | ICD-10-CM | POA: Diagnosis not present

## 2018-07-13 DIAGNOSIS — R278 Other lack of coordination: Secondary | ICD-10-CM | POA: Diagnosis not present

## 2018-07-13 DIAGNOSIS — M6281 Muscle weakness (generalized): Secondary | ICD-10-CM | POA: Diagnosis not present

## 2018-07-13 DIAGNOSIS — R2681 Unsteadiness on feet: Secondary | ICD-10-CM | POA: Diagnosis not present

## 2018-07-13 DIAGNOSIS — M25551 Pain in right hip: Secondary | ICD-10-CM | POA: Diagnosis not present

## 2018-07-13 DIAGNOSIS — R296 Repeated falls: Secondary | ICD-10-CM | POA: Diagnosis not present

## 2018-07-14 DIAGNOSIS — R278 Other lack of coordination: Secondary | ICD-10-CM | POA: Diagnosis not present

## 2018-07-14 DIAGNOSIS — Z9181 History of falling: Secondary | ICD-10-CM | POA: Diagnosis not present

## 2018-07-14 DIAGNOSIS — M6281 Muscle weakness (generalized): Secondary | ICD-10-CM | POA: Diagnosis not present

## 2018-07-14 DIAGNOSIS — R2681 Unsteadiness on feet: Secondary | ICD-10-CM | POA: Diagnosis not present

## 2018-07-14 DIAGNOSIS — M25551 Pain in right hip: Secondary | ICD-10-CM | POA: Diagnosis not present

## 2018-07-14 DIAGNOSIS — R296 Repeated falls: Secondary | ICD-10-CM | POA: Diagnosis not present

## 2018-07-15 DIAGNOSIS — L281 Prurigo nodularis: Secondary | ICD-10-CM | POA: Diagnosis not present

## 2018-07-15 DIAGNOSIS — L089 Local infection of the skin and subcutaneous tissue, unspecified: Secondary | ICD-10-CM | POA: Diagnosis not present

## 2018-07-16 DIAGNOSIS — R278 Other lack of coordination: Secondary | ICD-10-CM | POA: Diagnosis not present

## 2018-07-16 DIAGNOSIS — M25551 Pain in right hip: Secondary | ICD-10-CM | POA: Diagnosis not present

## 2018-07-16 DIAGNOSIS — Z9181 History of falling: Secondary | ICD-10-CM | POA: Diagnosis not present

## 2018-07-16 DIAGNOSIS — R296 Repeated falls: Secondary | ICD-10-CM | POA: Diagnosis not present

## 2018-07-16 DIAGNOSIS — M6281 Muscle weakness (generalized): Secondary | ICD-10-CM | POA: Diagnosis not present

## 2018-07-16 DIAGNOSIS — R2681 Unsteadiness on feet: Secondary | ICD-10-CM | POA: Diagnosis not present

## 2018-07-20 ENCOUNTER — Encounter: Payer: Self-pay | Admitting: Oncology

## 2018-07-20 ENCOUNTER — Ambulatory Visit (INDEPENDENT_AMBULATORY_CARE_PROVIDER_SITE_OTHER): Payer: Medicare Other | Admitting: Oncology

## 2018-07-20 ENCOUNTER — Other Ambulatory Visit: Payer: Self-pay

## 2018-07-20 VITALS — BP 118/49 | HR 66 | Temp 97.7°F | Ht 65.5 in | Wt 123.4 lb

## 2018-07-20 DIAGNOSIS — D473 Essential (hemorrhagic) thrombocythemia: Secondary | ICD-10-CM

## 2018-07-20 DIAGNOSIS — E039 Hypothyroidism, unspecified: Secondary | ICD-10-CM | POA: Diagnosis not present

## 2018-07-20 DIAGNOSIS — Z9181 History of falling: Secondary | ICD-10-CM | POA: Diagnosis not present

## 2018-07-20 DIAGNOSIS — R296 Repeated falls: Secondary | ICD-10-CM | POA: Diagnosis not present

## 2018-07-20 DIAGNOSIS — Z7982 Long term (current) use of aspirin: Secondary | ICD-10-CM

## 2018-07-20 DIAGNOSIS — Z79899 Other long term (current) drug therapy: Secondary | ICD-10-CM

## 2018-07-20 DIAGNOSIS — Z79890 Hormone replacement therapy (postmenopausal): Secondary | ICD-10-CM

## 2018-07-20 DIAGNOSIS — H9192 Unspecified hearing loss, left ear: Secondary | ICD-10-CM

## 2018-07-20 DIAGNOSIS — J301 Allergic rhinitis due to pollen: Secondary | ICD-10-CM | POA: Diagnosis not present

## 2018-07-20 DIAGNOSIS — M25551 Pain in right hip: Secondary | ICD-10-CM | POA: Diagnosis not present

## 2018-07-20 DIAGNOSIS — M6281 Muscle weakness (generalized): Secondary | ICD-10-CM | POA: Diagnosis not present

## 2018-07-20 DIAGNOSIS — R278 Other lack of coordination: Secondary | ICD-10-CM | POA: Diagnosis not present

## 2018-07-20 DIAGNOSIS — R2681 Unsteadiness on feet: Secondary | ICD-10-CM | POA: Diagnosis not present

## 2018-07-20 HISTORY — DX: Hypothyroidism, unspecified: E03.9

## 2018-07-20 NOTE — Patient Instructions (Signed)
To lab today  A referral was made to Dr Julieanne Manson at the [facility] for your ongoing Hematology care  It has been a pleasure working with you!

## 2018-07-20 NOTE — Progress Notes (Signed)
Hematology and Oncology Follow Up Visit  Alexander [name], Dr. [id number] 1921/08/18 85 y.o. 07/20/2018 1:02 PM   Principle Diagnosis: Encounter Diagnoses  Name Primary?  . Essential thrombocythemia ([place]) Yes  . 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 Platelet count running in the 7-800,000 range. He had no prior history of any thromboticor neurologicevents. I elected to start him on low-dose aspirin and observation. However, at the time of a 01/23/2014 visit, he reported an acute change in hearing in his left ear. His ear nose and throat surgeon felt that this might represent a thrombotic event to a small vessel to his eighth nerve. I elected to start him on platelet lowering therapy with hydroxyurea at that time. Initial dose 500 mg daily. Dose initially increased but ultimately stabilized at just 500 mg daily with excellent control of his counts.  Interim History: He continues to do well.  He never regained the hearing in his left ear.  He has had no other signs or symptoms of thrombosis.  No new neurologic symptoms.  He continues on low-dose Hydrea. He is seeing an ophthalmologist every 6 weeks and getting injections for macular degeneration. He has had no other interim medical problems.  He was found to be hypothyroid and is now on Synthroid.  No other new medications.  Medications: reviewed  Allergies:  Allergies  Allergen Reactions  . Pollen Extract     ragweed    Review of Systems: See interim history Remaining ROS negative:   Physical Exam: Blood pressure (!) 118/49, pulse 66, temperature 97.7 F (36.5 C), temperature source Oral, height 5' 5.5" (1.664 m), weight 123 lb 6.4 oz (56 kg), SpO2 96 %. Wt Readings from Last 3 Encounters:  07/20/18 123 lb 6.4 oz (56 kg)   05/11/18 124 lb (56.2 kg)  01/29/18 122 lb (55.3 kg)     General appearance: Frail elderly Caucasian man HENNT: Pharynx no erythema, exudate, mass, or ulcer. No thyromegaly or thyroid nodules Lymph nodes: No cervical, supraclavicular, or axillary lymphadenopathy Breasts: No abnormal skin changes, no dominant mass in either breast Lungs: Clear to auscultation, resonant to percussion throughout Heart: Regular rhythm, no murmur, no gallop, no rub, no click, no edema Abdomen: Soft, nontender, normal bowel sounds, no mass, no organomegaly Extremities: No edema, no calf tenderness Musculoskeletal: no joint deformities GU:  Vascular: Carotid pulses 2+, no bruits, symmetric Neurologic: Alert, oriented, PERRLA,, cranial nerves grossly normal, motor strength 5 over 5, reflexes 1+ symmetric, upper body coordination normal, gait normal, Skin: No rash or ecchymosis  Lab Results: CBC W/Diff    Component Value Date/Time   WBC 10.0 05/12/2018 1154   RBC 2.90 (L) 05/12/2018 1154   HGB 11.7 (L) 05/12/2018 1154   HGB 12.9 (L) 12/23/2017 0936   HGB 12.7 (L) 03/18/2013 1437   HCT 36.1 (L) 05/12/2018 1154   HCT 38.4 12/23/2017 0936   HCT 39.3 03/18/2013 1437   PLT 324 05/12/2018 1154   PLT 256 12/23/2017 0936   MCV 124.5 (H) 05/12/2018 1154   MCV 115 (H) 12/23/2017 0936   MCV 95.4 03/18/2013 1437   MCH 40.3 (H) 05/12/2018 1154   MCHC 32.4 05/12/2018 1154   RDW 12.8 05/12/2018 1154   RDW 14.4 12/23/2017 0936   RDW 15.0 (H) 03/18/2013 1437   LYMPHSABS 1.0 05/12/2018 1154   LYMPHSABS  2.1 12/23/2017 0936   LYMPHSABS 2.3 03/18/2013 1437   MONOABS 0.9 05/12/2018 1154   MONOABS 0.7 03/18/2013 1437   EOSABS 0.0 05/12/2018 1154   EOSABS 0.2 12/23/2017 0936   BASOSABS 0.1 05/12/2018 1154   BASOSABS 0.1 12/23/2017 0936   BASOSABS 0.3 (H) 03/18/2013 1437     Chemistry      Component Value Date/Time   NA 142 05/12/2018 1154   NA 143 12/23/2017 0936   NA 142 03/18/2013 1437   K 4.3 05/12/2018  1154   K 4.5 03/18/2013 1437   CL 108 05/12/2018 1154   CL 108 (H) 09/01/2012 0950   CO2 25 05/12/2018 1154   CO2 23 03/18/2013 1437   BUN 30 (H) 05/12/2018 1154   BUN 29 12/23/2017 0936   BUN 28.2 (H) 03/18/2013 1437   CREATININE 1.70 (H) 05/12/2018 1154   CREATININE 1.74 (H) 05/01/2014 0955   CREATININE 1.6 (H) 03/18/2013 1437      Component Value Date/Time   CALCIUM 8.6 (L) 05/12/2018 1154   CALCIUM 8.9 08/07/2014 0939   CALCIUM 9.7 03/18/2013 1437   ALKPHOS 57 12/23/2017 0936   ALKPHOS 82 09/01/2012 0950   AST 23 12/23/2017 0936   AST 22 09/01/2012 0950   ALT 17 12/23/2017 0936   ALT 16 09/01/2012 0950   BILITOT 0.6 12/23/2017 0936   BILITOT 0.40 09/01/2012 0950    Today's labs pending.  Most recent platelet count 324,000 on May 12, 2018 on Hydrea 500 mg daily.   Radiological Studies: No results found.  Impression:  Essential thrombocythemia and low-dose aspirin He remained stable on low-dose Hydrea Plan continue the same  I will transition his hematology care to Dr. Julieanne Manson at this time.  He frequently gets his lab checked in his son's office.  Otherwise given stable dose, I would check every other month.  CC: Patient Care Team: Crist Infante, MD as PCP - General (Internal Medicine) Elsie Stain, MD as Attending Physician (Pulmonary Disease) Elsie Stain, MD as Consulting Physician (Pulmonary Disease) Troy Sine, MD as Consulting Physician (Cardiology) Annia Belt, MD as Consulting Physician (Hematology and Oncology) Richmond Campbell, MD as Consulting Physician (Gastroenterology)   Murriel Hopper, MD, [place]  Hematology-Oncology/Internal Medicine     3/17/20201:02 PM

## 2018-07-21 LAB — CBC WITH DIFFERENTIAL/PLATELET
BASOS ABS: 0.1 10*3/uL (ref 0.0–0.2)
Basos: 2 %
EOS (ABSOLUTE): 0.1 10*3/uL (ref 0.0–0.4)
Eos: 3 %
Hematocrit: 33.3 % — ABNORMAL LOW (ref 37.5–51.0)
Hemoglobin: 11.7 g/dL — ABNORMAL LOW (ref 13.0–17.7)
Immature Grans (Abs): 0 10*3/uL (ref 0.0–0.1)
Immature Granulocytes: 1 %
LYMPHS: 24 %
Lymphocytes Absolute: 1.2 10*3/uL (ref 0.7–3.1)
MCH: 39.5 pg — ABNORMAL HIGH (ref 26.6–33.0)
MCHC: 35.1 g/dL (ref 31.5–35.7)
MCV: 113 fL — ABNORMAL HIGH (ref 79–97)
Monocytes Absolute: 0.6 10*3/uL (ref 0.1–0.9)
Monocytes: 11 %
Neutrophils Absolute: 2.9 10*3/uL (ref 1.4–7.0)
Neutrophils: 59 %
PLATELETS: 311 10*3/uL (ref 150–450)
RBC: 2.96 x10E6/uL — ABNORMAL LOW (ref 4.14–5.80)
RDW: 13.3 % (ref 11.6–15.4)
WBC: 4.9 10*3/uL (ref 3.4–10.8)

## 2018-07-21 LAB — TSH: TSH: 2.99 u[IU]/mL (ref 0.450–4.500)

## 2018-07-21 LAB — COMPREHENSIVE METABOLIC PANEL
ALT: 16 IU/L (ref 0–44)
AST: 20 IU/L (ref 0–40)
Albumin/Globulin Ratio: 1.4 (ref 1.2–2.2)
Albumin: 3.9 g/dL (ref 3.5–4.6)
Alkaline Phosphatase: 83 IU/L (ref 39–117)
BUN/Creatinine Ratio: 17 (ref 10–24)
BUN: 29 mg/dL (ref 10–36)
Bilirubin Total: 0.4 mg/dL (ref 0.0–1.2)
CO2: 18 mmol/L — ABNORMAL LOW (ref 20–29)
Calcium: 8.6 mg/dL (ref 8.6–10.2)
Chloride: 106 mmol/L (ref 96–106)
Creatinine, Ser: 1.66 mg/dL — ABNORMAL HIGH (ref 0.76–1.27)
GFR calc Af Amer: 40 mL/min/{1.73_m2} — ABNORMAL LOW (ref >59)
GFR, EST NON AFRICAN AMERICAN: 35 mL/min/{1.73_m2} — AB (ref >59)
Globulin, Total: 2.7 g/dL (ref 1.5–4.5)
Glucose: 96 mg/dL (ref 65–99)
POTASSIUM: 4.7 mmol/L (ref 3.5–5.2)
Sodium: 141 mmol/L (ref 134–144)
TOTAL PROTEIN: 6.6 g/dL (ref 6.0–8.5)

## 2018-07-21 LAB — LACTATE DEHYDROGENASE: LDH: 181 IU/L (ref 121–224)

## 2018-07-21 LAB — URIC ACID: Uric Acid: 5.4 mg/dL (ref 3.7–8.6)

## 2018-07-22 ENCOUNTER — Telehealth: Payer: Self-pay | Admitting: *Deleted

## 2018-07-22 DIAGNOSIS — R278 Other lack of coordination: Secondary | ICD-10-CM | POA: Diagnosis not present

## 2018-07-22 DIAGNOSIS — M25551 Pain in right hip: Secondary | ICD-10-CM | POA: Diagnosis not present

## 2018-07-22 DIAGNOSIS — Z9181 History of falling: Secondary | ICD-10-CM | POA: Diagnosis not present

## 2018-07-22 DIAGNOSIS — M6281 Muscle weakness (generalized): Secondary | ICD-10-CM | POA: Diagnosis not present

## 2018-07-22 DIAGNOSIS — R2681 Unsteadiness on feet: Secondary | ICD-10-CM | POA: Diagnosis not present

## 2018-07-22 DIAGNOSIS — R296 Repeated falls: Secondary | ICD-10-CM | POA: Diagnosis not present

## 2018-07-22 NOTE — Telephone Encounter (Signed)
Called pt - no answer; left message to give me a call back. 

## 2018-07-22 NOTE — Telephone Encounter (Signed)
-----   Message from Annia Belt, MD sent at 07/21/2018  9:05 AM EDT ----- Call Dr [name]: lab all stable compared w his baseline. Platelets good at 311,000. [place] labs forwarded to Dr Joylene Draft

## 2018-07-23 DIAGNOSIS — R278 Other lack of coordination: Secondary | ICD-10-CM | POA: Diagnosis not present

## 2018-07-23 DIAGNOSIS — M6281 Muscle weakness (generalized): Secondary | ICD-10-CM | POA: Diagnosis not present

## 2018-07-23 DIAGNOSIS — R296 Repeated falls: Secondary | ICD-10-CM | POA: Diagnosis not present

## 2018-07-23 DIAGNOSIS — Z9181 History of falling: Secondary | ICD-10-CM | POA: Diagnosis not present

## 2018-07-23 DIAGNOSIS — R2681 Unsteadiness on feet: Secondary | ICD-10-CM | POA: Diagnosis not present

## 2018-07-23 DIAGNOSIS — M25551 Pain in right hip: Secondary | ICD-10-CM | POA: Diagnosis not present

## 2018-07-23 NOTE — Telephone Encounter (Signed)
Called pt - no answer; left message "lab all stable compared w his baseline. Platelets good at 311,000. Copy labs forwarded to Dr Joylene Draft "peer Dr [name].  And call for any questions.

## 2018-07-26 ENCOUNTER — Encounter: Payer: Self-pay | Admitting: Oncology

## 2018-07-26 ENCOUNTER — Telehealth: Payer: Self-pay | Admitting: Oncology

## 2018-07-26 DIAGNOSIS — R278 Other lack of coordination: Secondary | ICD-10-CM | POA: Diagnosis not present

## 2018-07-26 DIAGNOSIS — M25551 Pain in right hip: Secondary | ICD-10-CM | POA: Diagnosis not present

## 2018-07-26 DIAGNOSIS — R296 Repeated falls: Secondary | ICD-10-CM | POA: Diagnosis not present

## 2018-07-26 DIAGNOSIS — M6281 Muscle weakness (generalized): Secondary | ICD-10-CM | POA: Diagnosis not present

## 2018-07-26 DIAGNOSIS — R2681 Unsteadiness on feet: Secondary | ICD-10-CM | POA: Diagnosis not present

## 2018-07-26 DIAGNOSIS — Z9181 History of falling: Secondary | ICD-10-CM | POA: Diagnosis not present

## 2018-07-26 NOTE — Telephone Encounter (Signed)
A new patient appt has been scheduled for the pt to see Dr. Benay Spice on 5/28 at 2pm with labs at 1:30pm. Letter mailed.

## 2018-07-27 DIAGNOSIS — M25551 Pain in right hip: Secondary | ICD-10-CM | POA: Diagnosis not present

## 2018-07-27 DIAGNOSIS — R278 Other lack of coordination: Secondary | ICD-10-CM | POA: Diagnosis not present

## 2018-07-27 DIAGNOSIS — M6281 Muscle weakness (generalized): Secondary | ICD-10-CM | POA: Diagnosis not present

## 2018-07-27 DIAGNOSIS — R2681 Unsteadiness on feet: Secondary | ICD-10-CM | POA: Diagnosis not present

## 2018-07-27 DIAGNOSIS — Z9181 History of falling: Secondary | ICD-10-CM | POA: Diagnosis not present

## 2018-07-27 DIAGNOSIS — R296 Repeated falls: Secondary | ICD-10-CM | POA: Diagnosis not present

## 2018-07-28 ENCOUNTER — Other Ambulatory Visit: Payer: Self-pay | Admitting: *Deleted

## 2018-07-28 NOTE — Patient Outreach (Signed)
[facility]) Care Management  07/28/2018  [name], Dr. 11/26/1921 [id number]   CSW spoke with yesterday by phone who reports he plans to go stay with his son for a while (due to the COVID exposure concerns). He still is hopeful to return to his home as well as to follow up in regards to [place] classes.  CSW will signoff at this time. CSW will advise PCP and [facility] team- please re-consult if needs arise.   Eduard Clos, MSW, [place] Worker  [place] [phone]

## 2018-07-30 ENCOUNTER — Telehealth: Payer: Self-pay | Admitting: Internal Medicine

## 2018-07-30 NOTE — Telephone Encounter (Signed)
Pt called. Answering my call from 3/18 re his lab results which I directed my RN to call him with on 3/18. I believe he is becoming forgetful.

## 2018-07-30 NOTE — Telephone Encounter (Signed)
Pt is returning Dr Darnell Level; pls contact [phone]

## 2018-08-02 ENCOUNTER — Telehealth: Payer: Self-pay | Admitting: Podiatry

## 2018-08-02 NOTE — Telephone Encounter (Addendum)
I spoke with pt's graddtr Gwenlyn Found, she states pt is being "quarantined" at her parents' house, to call there [phone].

## 2018-08-02 NOTE — Telephone Encounter (Signed)
Pt called requesting a call from Dr. Prudence Davidson. Patient did not state what trouble/problems they were having but wanted a call back.

## 2018-08-02 NOTE — Telephone Encounter (Signed)
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 Prudence Davidson concerning.

## 2018-08-04 ENCOUNTER — Other Ambulatory Visit: Payer: Self-pay | Admitting: Cardiovascular Disease

## 2018-08-04 ENCOUNTER — Other Ambulatory Visit: Payer: Self-pay | Admitting: Oncology

## 2018-08-04 NOTE — Telephone Encounter (Signed)
Refilled lopressor

## 2018-08-05 DIAGNOSIS — H353231 Exudative age-related macular degeneration, bilateral, with active choroidal neovascularization: Secondary | ICD-10-CM | POA: Diagnosis not present

## 2018-08-09 ENCOUNTER — Other Ambulatory Visit: Payer: Self-pay | Admitting: *Deleted

## 2018-08-09 MED ORDER — HYDROXYUREA 500 MG PO CAPS: ORAL_CAPSULE | ORAL | 0 refills | Status: DC

## 2018-08-09 NOTE — Progress Notes (Signed)
Received refill request for Hydrea 500 mg daily. Sees Dr. Benay Spice on 09/30/18. Approved refill X 1.

## 2018-08-16 ENCOUNTER — Other Ambulatory Visit: Payer: Self-pay | Admitting: Cardiovascular Disease

## 2018-09-06 DIAGNOSIS — E039 Hypothyroidism, unspecified: Secondary | ICD-10-CM | POA: Diagnosis not present

## 2018-09-06 DIAGNOSIS — M810 Age-related osteoporosis without current pathological fracture: Secondary | ICD-10-CM | POA: Diagnosis not present

## 2018-09-06 DIAGNOSIS — R2689 Other abnormalities of gait and mobility: Secondary | ICD-10-CM | POA: Diagnosis not present

## 2018-09-06 DIAGNOSIS — I2510 Atherosclerotic heart disease of native coronary artery without angina pectoris: Secondary | ICD-10-CM | POA: Diagnosis not present

## 2018-09-06 DIAGNOSIS — M4850XS Collapsed vertebra, not elsewhere classified, site unspecified, sequela of fracture: Secondary | ICD-10-CM | POA: Diagnosis not present

## 2018-09-06 DIAGNOSIS — D473 Essential (hemorrhagic) thrombocythemia: Secondary | ICD-10-CM | POA: Diagnosis not present

## 2018-09-06 DIAGNOSIS — D519 Vitamin B12 deficiency anemia, unspecified: Secondary | ICD-10-CM | POA: Diagnosis not present

## 2018-09-06 DIAGNOSIS — S329XXA Fracture of unspecified parts of lumbosacral spine and pelvis, initial encounter for closed fracture: Secondary | ICD-10-CM | POA: Diagnosis not present

## 2018-09-06 DIAGNOSIS — R7301 Impaired fasting glucose: Secondary | ICD-10-CM | POA: Diagnosis not present

## 2018-09-06 DIAGNOSIS — N183 Chronic kidney disease, stage 3 (moderate): Secondary | ICD-10-CM | POA: Diagnosis not present

## 2018-09-08 DIAGNOSIS — H35039 Hypertensive retinopathy, unspecified eye: Secondary | ICD-10-CM | POA: Diagnosis not present

## 2018-09-08 DIAGNOSIS — H2510 Age-related nuclear cataract, unspecified eye: Secondary | ICD-10-CM | POA: Diagnosis not present

## 2018-09-29 ENCOUNTER — Other Ambulatory Visit: Payer: Self-pay | Admitting: *Deleted

## 2018-09-29 DIAGNOSIS — D473 Essential (hemorrhagic) thrombocythemia: Secondary | ICD-10-CM

## 2018-09-30 ENCOUNTER — Inpatient Hospital Stay: Payer: Medicare Other | Attending: Oncology

## 2018-09-30 ENCOUNTER — Inpatient Hospital Stay: Payer: Medicare Other | Admitting: Oncology

## 2018-09-30 ENCOUNTER — Telehealth: Payer: Self-pay | Admitting: *Deleted

## 2018-09-30 NOTE — Telephone Encounter (Signed)
Left VM and sent MyChart message to cancel office visit per provider. May have lab today and Dr. Benay Spice will see him when next lab is due (2 months) unless he is having issues that require being seen sooner.

## 2018-10-06 ENCOUNTER — Telehealth: Payer: Self-pay | Admitting: Oncology

## 2018-10-06 NOTE — Telephone Encounter (Signed)
Attempted to contact Mr. Alexander [name] to r/s his appt w/Dr. Learta Codding, but was unable to reach the pt. R/s appt to 6/23 at 2pm w/labs at 130pm. Letter mailed.

## 2018-10-07 DIAGNOSIS — H353231 Exudative age-related macular degeneration, bilateral, with active choroidal neovascularization: Secondary | ICD-10-CM | POA: Diagnosis not present

## 2018-10-09 DIAGNOSIS — Z20828 Contact with and (suspected) exposure to other viral communicable diseases: Secondary | ICD-10-CM | POA: Diagnosis not present

## 2018-10-11 ENCOUNTER — Other Ambulatory Visit: Payer: Self-pay | Admitting: Cardiovascular Disease

## 2018-10-25 ENCOUNTER — Telehealth: Payer: Self-pay

## 2018-10-25 NOTE — Telephone Encounter (Signed)
Called patient to go over pre-screening questions for upcoming appt. Pt has no voicemail. Could not leave message.  

## 2018-10-26 ENCOUNTER — Inpatient Hospital Stay: Payer: Medicare Other | Attending: Oncology | Admitting: Oncology

## 2018-10-26 ENCOUNTER — Inpatient Hospital Stay: Payer: Medicare Other

## 2018-11-10 ENCOUNTER — Other Ambulatory Visit: Payer: Self-pay | Admitting: *Deleted

## 2018-11-10 MED ORDER — HYDROXYUREA 500 MG PO CAPS: 500.0000 mg | ORAL_CAPSULE | Freq: Every day | ORAL | 0 refills | Status: DC

## 2018-11-11 ENCOUNTER — Telehealth: Payer: Self-pay | Admitting: *Deleted

## 2018-11-11 NOTE — Telephone Encounter (Signed)
Per Dr. Benay Spice: Needs to be seen with lab soon. Unable to reach patient at all #'s listed. Left VM with son, Dellis Filbert requesting his assistance in getting a f/u appointment with Dr. Benay Spice w/lab. Hydrea refill approved, but will need to be seen before next refill is authorized.

## 2018-11-16 ENCOUNTER — Telehealth: Payer: Self-pay | Admitting: *Deleted

## 2018-11-16 NOTE — Telephone Encounter (Signed)
Called patient and provided new patient appointment for 11/29/18 at 1:30/2:00 lab and OV with BS. Mailed calendar and business card to his home as requested. He prefers not to communicate via [place].

## 2018-11-29 ENCOUNTER — Inpatient Hospital Stay: Payer: Medicare Other

## 2018-11-29 ENCOUNTER — Inpatient Hospital Stay: Payer: Medicare Other | Attending: Oncology | Admitting: Oncology

## 2018-11-29 ENCOUNTER — Other Ambulatory Visit: Payer: Self-pay

## 2018-11-29 VITALS — BP 109/60 | HR 64 | Temp 100.0°F | Resp 17 | Ht 65.5 in | Wt 119.4 lb

## 2018-11-29 DIAGNOSIS — H3530 Unspecified macular degeneration: Secondary | ICD-10-CM

## 2018-11-29 DIAGNOSIS — E785 Hyperlipidemia, unspecified: Secondary | ICD-10-CM | POA: Diagnosis not present

## 2018-11-29 DIAGNOSIS — Z951 Presence of aortocoronary bypass graft: Secondary | ICD-10-CM | POA: Diagnosis not present

## 2018-11-29 DIAGNOSIS — N400 Enlarged prostate without lower urinary tract symptoms: Secondary | ICD-10-CM | POA: Diagnosis not present

## 2018-11-29 DIAGNOSIS — D473 Essential (hemorrhagic) thrombocythemia: Secondary | ICD-10-CM | POA: Diagnosis not present

## 2018-11-29 DIAGNOSIS — I129 Hypertensive chronic kidney disease with stage 1 through stage 4 chronic kidney disease, or unspecified chronic kidney disease: Secondary | ICD-10-CM

## 2018-11-29 DIAGNOSIS — D631 Anemia in chronic kidney disease: Secondary | ICD-10-CM

## 2018-11-29 DIAGNOSIS — N183 Chronic kidney disease, stage 3 (moderate): Secondary | ICD-10-CM

## 2018-11-29 DIAGNOSIS — E039 Hypothyroidism, unspecified: Secondary | ICD-10-CM

## 2018-11-29 DIAGNOSIS — H9190 Unspecified hearing loss, unspecified ear: Secondary | ICD-10-CM

## 2018-11-29 DIAGNOSIS — Z79899 Other long term (current) drug therapy: Secondary | ICD-10-CM

## 2018-11-29 DIAGNOSIS — K219 Gastro-esophageal reflux disease without esophagitis: Secondary | ICD-10-CM | POA: Diagnosis not present

## 2018-11-29 LAB — CBC WITH DIFFERENTIAL (CANCER CENTER ONLY)
Abs Immature Granulocytes: 0.03 10*3/uL (ref 0.00–0.07)
Basophils Absolute: 0.1 10*3/uL (ref 0.0–0.1)
Basophils Relative: 1 %
Eosinophils Absolute: 0.1 10*3/uL (ref 0.0–0.5)
Eosinophils Relative: 2 %
HCT: 34.8 % — ABNORMAL LOW (ref 39.0–52.0)
Hemoglobin: 11.3 g/dL — ABNORMAL LOW (ref 13.0–17.0)
Immature Granulocytes: 1 %
Lymphocytes Relative: 30 %
Lymphs Abs: 1.5 10*3/uL (ref 0.7–4.0)
MCH: 39.2 pg — ABNORMAL HIGH (ref 26.0–34.0)
MCHC: 32.5 g/dL (ref 30.0–36.0)
MCV: 120.8 fL — ABNORMAL HIGH (ref 80.0–100.0)
Monocytes Absolute: 0.8 10*3/uL (ref 0.1–1.0)
Monocytes Relative: 17 %
Neutro Abs: 2.4 10*3/uL (ref 1.7–7.7)
Neutrophils Relative %: 49 %
Platelet Count: 291 10*3/uL (ref 150–400)
RBC: 2.88 MIL/uL — ABNORMAL LOW (ref 4.22–5.81)
RDW: 12.7 % (ref 11.5–15.5)
WBC Count: 4.9 10*3/uL (ref 4.0–10.5)
nRBC: 0 % (ref 0.0–0.2)

## 2018-11-29 NOTE — Progress Notes (Signed)
[place] Patient Consult   Requesting MD: Crist Infante, Md [address] [place],  [place] [address]   Alexander [name], Dr. 85 y.o.  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 since 2014 for an elevated platelet count.  He was diagnosed with essential thrombocythemia, Jak 2+.  He was placed on hydroxyurea 2015 when he developed an acute change in his left-sided hearing felt to potentially be related to a thrombotic event. He continues hydroxyurea.  He denies bleeding and symptoms of thrombosis.  Past Medical History:  Diagnosis Date  . Allergic rhinitis   . Anemia   . Anemia, chronic renal failure 10/10/2013  . Anemia, normocytic normochromic 10/10/2013  . BPH (benign prostatic hyperplasia)   . Calcium oxalate renal stones   . CKD (chronic kidney disease), stage III ([place])   . Diverticulitis   . Dry senile macular degeneration   . Essential thrombocythemia ([place]) 10/10/2013  . GERD (gastroesophageal reflux disease)   . Hearing loss in left ear   . Heart murmur   . Hyperlipidemia   . Hypertension   . Hypothyroid 07/20/2018  . Migraines    OCULAR  . Peripheral neuropathy   . Renal lesion   . Spinal stenosis   . Thrombocytosis ([place]) 08/18/2012   Platelets 711,000  Hb 14, WBC 8,500 08/05/12  . Vitamin deficiency     Past Surgical History:  Procedure Laterality Date  . APPENDECTOMY  1942  . CARDIAC CATHETERIZATION  02/19/2010   EF55%, medically treated,   . CORONARY ARTERY BYPASS GRAFT  1996  . INGUINAL HERNIA REPAIR     x2  . LAPAROSCOPIC CHOLECYSTECTOMY  1992  . left renal mass ablated  12/11  . NASAL CONCHA BULLOSA RESECTION    . NM MYOCAR PERF WALL MOTION  02/14/2010   post EF 71%, Exercise cap.7METS  . TONSILLECTOMY  1937  . TRANSTHORACIC ECHOCARDIOGRAM  12/10/2011   HFWY>63%, stage1 diastolic dysfunction, mild to mod. LAE,   . TRANSTHORACIC ECHOCARDIOGRAM  07/25/2008   mild-moderate MR, mild-modTricuspid regurg.    Medications: Reviewed  Allergies:  Allergies  Allergen Reactions  . Pollen Extract     ragweed    Family history: No family history of cancer  Social History:   He lives with a friend in [place].  He is an internal medicine physician, he continues to work part-time.  He does not use cigarettes or alcohol.  ROS:   Positives include: Progressive weight loss over several years, diarrhea with certain foods  A complete ROS was otherwise negative.  Physical Exam:  Blood pressure 109/60, pulse 64, temperature 100 F (37.8 C), temperature source Oral, resp. rate 17, height 5' 5.5" (1.664 m), weight 119 lb 6.4 oz (54.2 kg), SpO2 100 %.  Limited physical examination secondary to distancing with the COVID pandemic Abdomen: No hepatosplenomegaly, nontender  Vascular: No leg edema Lymph nodes: No cervical or supraclavicular nodes Neurologic: Alert and oriented Skin: No leg ulcers   LAB:  CBC  Lab Results  Component Value Date   WBC 4.9 11/29/2018   HGB 11.3 (L) 11/29/2018   HCT 34.8 (L) 11/29/2018   MCV 120.8 (H) 11/29/2018   PLT 291 11/29/2018   NEUTROABS 2.4 11/29/2018        CMP  Lab Results  Component Value Date   NA 141 07/20/2018   K 4.7 07/20/2018   CL 106 07/20/2018   CO2 18 (L)  07/20/2018   GLUCOSE 96 07/20/2018   BUN 29 07/20/2018   CREATININE 1.66 (H) 07/20/2018   CALCIUM 8.6 07/20/2018   PROT 6.6 07/20/2018   ALBUMIN 3.9 07/20/2018   AST 20 07/20/2018   ALT 16 07/20/2018   ALKPHOS 83 07/20/2018   BILITOT 0.4 07/20/2018   GFRNONAA 35 (L) 07/20/2018   GFRAA 40 (L) 07/20/2018     Assessment/Plan:   1. Essential thrombocytosis, JAK2 (V617F)-positive  Hydroxyurea started in 2015 2. Chronic renal failure 3. Left-sided hearing loss 4. Coronary artery disease, status post coronary artery bypass surgery in 1996 5. Macular degeneration 6. BPH 7. Anemia   Disposition:   [name] has  essential thrombocytosis.  Her platelet count is adequately controlled with hydroxyurea at the current dose.  The anemia is secondary to hydroxyurea, the myeloproliferative disorder, and renal insufficiency.  The hemoglobin has not changed significantly for the past several years.  He has lost weight over several years.  I have a low clinical suspicion for malignancy.  He reports a good appetite.  He will continue follow-up with [name] for evaluation of weight loss.   [name] will continue hydroxyurea at the current dose.  He will return for a lab visit in 3 months and an office visit in 6 months. Betsy Coder, MD  11/29/2018, 2:16 PM

## 2018-11-30 ENCOUNTER — Telehealth: Payer: Self-pay | Admitting: Oncology

## 2018-11-30 NOTE — Telephone Encounter (Signed)
Called and left msg. Mailed printout  °

## 2018-12-02 DIAGNOSIS — H353231 Exudative age-related macular degeneration, bilateral, with active choroidal neovascularization: Secondary | ICD-10-CM | POA: Diagnosis not present

## 2018-12-27 ENCOUNTER — Other Ambulatory Visit: Payer: Self-pay | Admitting: Cardiovascular Disease

## 2019-01-05 DIAGNOSIS — Z23 Encounter for immunization: Secondary | ICD-10-CM | POA: Diagnosis not present

## 2019-01-06 DIAGNOSIS — H04129 Dry eye syndrome of unspecified lacrimal gland: Secondary | ICD-10-CM | POA: Diagnosis not present

## 2019-01-06 DIAGNOSIS — Z9841 Cataract extraction status, right eye: Secondary | ICD-10-CM | POA: Diagnosis not present

## 2019-01-06 DIAGNOSIS — Z961 Presence of intraocular lens: Secondary | ICD-10-CM | POA: Diagnosis not present

## 2019-01-06 DIAGNOSIS — H353231 Exudative age-related macular degeneration, bilateral, with active choroidal neovascularization: Secondary | ICD-10-CM | POA: Diagnosis not present

## 2019-01-13 ENCOUNTER — Encounter (HOSPITAL_COMMUNITY): Payer: Medicare Other

## 2019-01-27 ENCOUNTER — Telehealth: Payer: Self-pay | Admitting: *Deleted

## 2019-01-27 DIAGNOSIS — R9431 Abnormal electrocardiogram [ECG] [EKG]: Secondary | ICD-10-CM | POA: Diagnosis not present

## 2019-01-27 DIAGNOSIS — R0789 Other chest pain: Secondary | ICD-10-CM | POA: Diagnosis not present

## 2019-01-27 NOTE — Telephone Encounter (Signed)
DOD call from Alexander [name], patient in their office with chest pain and abnormal ECG. Dr Stanford Breed reviewed ECG and spoke to the NP at that office. ECG is similar to previous. Patient refuses to see PA or NP and they request the patient be seen by dr Claiborne Billings Monday next week. Follow up scheduled, records given to covering nurse, michele, for Monday appointment.

## 2019-01-31 ENCOUNTER — Ambulatory Visit: Payer: Medicare Other | Admitting: Cardiovascular Disease

## 2019-02-08 ENCOUNTER — Other Ambulatory Visit: Payer: Self-pay

## 2019-02-08 DIAGNOSIS — Z20822 Contact with and (suspected) exposure to covid-19: Secondary | ICD-10-CM

## 2019-02-08 DIAGNOSIS — Z20828 Contact with and (suspected) exposure to other viral communicable diseases: Secondary | ICD-10-CM | POA: Diagnosis not present

## 2019-02-10 LAB — NOVEL CORONAVIRUS, NAA: SARS-CoV-2, NAA: NOT DETECTED

## 2019-02-17 DIAGNOSIS — H04129 Dry eye syndrome of unspecified lacrimal gland: Secondary | ICD-10-CM | POA: Diagnosis not present

## 2019-02-17 DIAGNOSIS — Z961 Presence of intraocular lens: Secondary | ICD-10-CM | POA: Diagnosis not present

## 2019-02-17 DIAGNOSIS — H353231 Exudative age-related macular degeneration, bilateral, with active choroidal neovascularization: Secondary | ICD-10-CM | POA: Diagnosis not present

## 2019-02-22 ENCOUNTER — Telehealth (INDEPENDENT_AMBULATORY_CARE_PROVIDER_SITE_OTHER): Payer: Medicare Other | Admitting: Physician Assistant

## 2019-02-22 ENCOUNTER — Other Ambulatory Visit: Payer: Self-pay | Admitting: Cardiovascular Disease

## 2019-02-22 ENCOUNTER — Other Ambulatory Visit: Payer: Self-pay | Admitting: Oncology

## 2019-02-22 ENCOUNTER — Encounter: Payer: Self-pay | Admitting: Physician Assistant

## 2019-02-22 VITALS — Ht 66.0 in | Wt 119.0 lb

## 2019-02-22 DIAGNOSIS — I10 Essential (primary) hypertension: Secondary | ICD-10-CM

## 2019-02-22 DIAGNOSIS — N183 Chronic kidney disease, stage 3 unspecified: Secondary | ICD-10-CM

## 2019-02-22 DIAGNOSIS — R002 Palpitations: Secondary | ICD-10-CM | POA: Diagnosis not present

## 2019-02-22 DIAGNOSIS — E782 Mixed hyperlipidemia: Secondary | ICD-10-CM

## 2019-02-22 DIAGNOSIS — I25700 Atherosclerosis of coronary artery bypass graft(s), unspecified, with unstable angina pectoris: Secondary | ICD-10-CM

## 2019-02-22 DIAGNOSIS — D75839 Thrombocytosis, unspecified: Secondary | ICD-10-CM

## 2019-02-22 DIAGNOSIS — E039 Hypothyroidism, unspecified: Secondary | ICD-10-CM

## 2019-02-22 DIAGNOSIS — D473 Essential (hemorrhagic) thrombocythemia: Secondary | ICD-10-CM

## 2019-02-22 NOTE — Progress Notes (Signed)
Virtual Visit via Telephone Note   This visit type was conducted due to national recommendations for restrictions regarding the COVID-19 Pandemic (e.g. social distancing) in an effort to limit this patient's exposure and mitigate transmission in our community.  Due to his co-morbid illnesses, this patient is at least at moderate risk for complications without adequate follow up.  This format is felt to be most appropriate for this patient at this time.  The patient did not have access to video technology/had technical difficulties with video requiring transitioning to audio format only (telephone).  All issues noted in this document were discussed and addressed.  No physical exam could be performed with this format.  Please refer to the patient's chart for his  consent to telehealth for [facility].   Date:  02/24/2019   ID:  Alexander [name], Dr., DOB 16-Nov-1921, MRN [id number]  Patient Location: Home Provider Location: Home  PCP:  Crist Infante, MD  Cardiologist:  Shelva Majestic, MD  Electrophysiologist:  None  Nephrology service: [name] Hematology: 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 male with past medical history of CAD s/p CABG 25 (LIMA-LAD, SVG-marginal, SVG-ramus, and SVG-PDA), CKD stage III, GERD, hypertension, hypothyroidism and hyperlipidemia.  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  Cardiac catheterization in 2011 showed severe CAD with 70 to 80% ostial left main disease, total occlusion of LAD after diagonal, 95% ramus intermedius stenosis, total occlusion of marginal branch of left circumflex artery, and severe stenosis in RCA followed by occlusion of the PDA and ostium.  SVG to marginal branch was occluded, SVG to ramus was also occluded.  Patent LIMA to LAD and SVG to PDA of RCA.  He was unable to tolerate Ranexa in the past.  He also has  prior history of intolerant to statins and Zetia.  He has been followed by [name] for elevated platelet.  Thrombocytosis was treated with addition of hydroxyurea.  He was found to have a mutation in JAK2 gene consistent with diagnosis of essential thrombocythemia.  Echocardiogram obtained in April 2019 showed EF 60 to 65%, grade 1 DD, mild aortic sclerosis with mild AR, mild pulmonary hypertension with PA peak pressure 39 mmHg.  Patient previously called the cardiology service for arrhythmia and was set up to see Dr. Claiborne Billings near the end of September.  However he no showed.  He says he is still having arrhythmia on a daily basis, however this is being controlled on the metoprolol.  He does not have any exertional chest pain.  He has some baseline dyspnea on exertion however this is consistent with his age.  He denies any dizziness or feeling of passing out.  I recommended him to continue on the current therapy.  We will obtain a 48-hour Holter monitor to determine his PVC burden.  Previous EKG obtained on 9/124/2020 was personally reviewed and that this did not show any ST changes however did show that patient was in trigeminy at the time.  Given no exertional chest pain or increasing shortness of breath, I did not order echocardiogram, though this can be considered on the next follow-up by Dr. Claiborne Billings.  I will arrange for the patient to see Dr. Claiborne Billings in 1 month.  The patient does not have symptoms concerning for COVID-19 infection (fever, chills, cough, or new shortness of breath).  Past Medical History:  Diagnosis Date   Allergic rhinitis    Anemia    Anemia, chronic renal failure 10/10/2013   Anemia, normocytic normochromic 10/10/2013   BPH (benign prostatic hyperplasia)    Calcium oxalate renal stones    CKD (chronic kidney disease), stage III    Diverticulitis    Dry senile macular degeneration    Essential thrombocythemia ([place]) 10/10/2013   GERD (gastroesophageal reflux disease)     Hearing loss in left ear    Heart murmur    Hyperlipidemia    Hypertension    Hypothyroid 07/20/2018   Migraines    OCULAR   Peripheral neuropathy    Renal lesion    Spinal stenosis    Thrombocytosis (HCC) 08/18/2012   Platelets 711,000  Hb 14, WBC 8,500 08/05/12   Vitamin deficiency    Past Surgical History:  Procedure Laterality Date   APPENDECTOMY  1942   CARDIAC CATHETERIZATION  02/19/2010   EF55%, medically treated,    CORONARY ARTERY BYPASS GRAFT  1996   INGUINAL HERNIA REPAIR     x2   LAPAROSCOPIC CHOLECYSTECTOMY  1992   left renal mass ablated  12/11   NASAL CONCHA BULLOSA RESECTION     NM MYOCAR PERF WALL MOTION  02/14/2010   post EF 71%, Exercise cap.7METS   TONSILLECTOMY  1937   TRANSTHORACIC ECHOCARDIOGRAM  12/10/2011   LZJQ>73%, stage1 diastolic dysfunction, mild to mod. LAE,    TRANSTHORACIC ECHOCARDIOGRAM  07/25/2008   mild-moderate MR, mild-modTricuspid regurg.     Current Meds  Medication Sig   aluminum & magnesium hydroxide-simethicone (MYLANTA) 500-450-40 MG/5ML suspension Take by mouth every 6 (six) hours as needed. 200-200-20mg /36ml    aspirin 81 MG tablet Take 81 mg by mouth daily.     betamethasone dipropionate (DIPROLENE) 0.05 % ointment daily as needed.   cholecalciferol (VITAMIN D) 1000 UNITS tablet Take 2,000 Units by mouth daily.    Coenzyme Q10 (CO Q-10) 200 MG CAPS Take 1 capsule by mouth daily.    Cyanocobalamin (VITAMIN B-12 IJ) Inject as directed. monthly    denosumab (PROLIA) 60 MG/ML SOLN injection Inject 60 mg into the skin every 6 (six) months. Administer in upper arm, thigh, or abdomen   ezetimibe (ZETIA) 10 MG tablet Take 10 mg by mouth daily.   famotidine (PEPCID) 10 MG tablet Take 10 mg by mouth daily.    finasteride (PROSCAR) 5 MG tablet Take 1 tablet by mouth 3 (three) times a week. TIW   fluticasone (FLONASE) 50 MCG/ACT nasal spray Place 1 spray into both nostrils daily as needed for allergies.      levothyroxine (SYNTHROID, LEVOTHROID) 25 MCG tablet Take 25 mcg by mouth daily before breakfast.   LUTEIN PO Take 10 mg by mouth once a week.    metoprolol tartrate (LOPRESSOR) 25 MG tablet Take 25 mg by mouth 2 (two) times daily. Take 1 tablet in morning and 1/2 in the evening depending on HR and rhythm   Multiple Vitamin (MULTIVITAMIN) tablet Take 1 tablet by mouth daily.     Multiple Vitamins-Minerals (PRESERVISION AREDS PO) Take 1 capsule by mouth 2 (two) times daily.     zolpidem (AMBIEN) 5 MG tablet Take 1 tablet (5 mg total) by mouth at bedtime as needed for sleep. OV NEEDED   [DISCONTINUED] hydroxyurea (HYDREA) 500 MG capsule Take 1 capsule (500 mg total) by mouth daily. TAKE 1 CAPSULE BY MOUTH  DAILY. MAY TAKE WITH FOOD  TO MINIMIZE GI SIDE  EFFECTS.  Allergies:   Pollen extract   Social History   Tobacco Use   Smoking status: Never Smoker   Smokeless tobacco: Never Used  Substance Use Topics   Alcohol use: No   Drug use: No     Family Hx: The patient's family history includes Allergies in his daughter; Asthma in his grandchild; Coronary artery disease in his maternal grandfather; Diabetes in his father; Heart attack in his maternal grandfather; Heart disease in his maternal grandmother; Heart failure in his father, maternal grandmother, mother, and sister; Other in his father, mother, and paternal grandmother.  ROS:   Please see the history of present illness.     All other systems reviewed and are negative.   Prior CV studies:   The following studies were reviewed today:  Echo 05/13/2018 LV EF: 60% -   65% Study Conclusions  - Left ventricle: The cavity size was normal. Wall thickness was   normal. Systolic function was normal. The estimated ejection   fraction was in the range of 60% to 65%. Doppler parameters are   consistent with both elevated ventricular end-diastolic filling   pressure and elevated left atrial filling pressure. - Aortic valve:  There was mild stenosis. There was mild   regurgitation. - Mitral valve: Calcified annulus. There was mild regurgitation. - Atrial septum: No defect or patent foramen ovale was identified. - Pulmonary arteries: PA peak pressure: 37 mm Hg (S).  Labs/Other Tests and Data Reviewed:    EKG:  An ECG dated 01/27/2019 was personally reviewed today and demonstrated:  Sinus rhythm was trigeminy  Recent Labs: 07/20/2018: ALT 16; BUN 29; Creatinine, Ser 1.66; Potassium 4.7; Sodium 141; TSH 2.990 11/29/2018: Hemoglobin 11.3; Platelet Count 291   Recent Lipid Panel Lab Results  Component Value Date/Time   CHOL 164 03/20/2016 11:09 AM   CHOL 163 09/12/2015 11:38 AM   TRIG 251 (H) 03/20/2016 11:09 AM   HDL 31 (L) 03/20/2016 11:09 AM   HDL 33 (L) 09/12/2015 11:38 AM   CHOLHDL 5.3 (H) 03/20/2016 11:09 AM   LDLCALC 83 03/20/2016 11:09 AM   LDLCALC 85 09/12/2015 11:38 AM    Wt Readings from Last 3 Encounters:  02/22/19 119 lb (54 kg)  11/29/18 119 lb 6.4 oz (54.2 kg)  07/20/18 123 lb 6.4 oz (56 kg)     Objective:    Vital Signs:  Ht 5\' 6"  (1.676 m)    Wt 119 lb (54 kg)    BMI 19.21 kg/m    VITAL SIGNS:  reviewed  ASSESSMENT & PLAN:    1. Palpitation: Recent EKG demonstrated sinus rhythm was trigeminy.  I suspect palpitation is related to frequent PVC.  Palpitation occurring on a daily basis however frequency has been controlled on metoprolol.  He takes 50 mg in a.m. and 25 mg in p.m based on his heart rate and rhythm.  I will arrange for a 48-hour Holter monitor.  I will defer to Dr. Claiborne Billings to consider a repeat echocardiogram on follow-up  2. CAD s/p CABG: Denies any recent anginal symptoms.  Continue aspirin  3. Hypertension: Blood pressure stable  4. Hyperlipidemia: On Zetia.  5. Hypothyroidism: On levothyroxine.  6. CKD stage III: Followed by primary care provider.  7. Thrombocytosis: On hydroxyurea.  COVID-19 Education: The signs and symptoms of COVID-19 were discussed with the  patient and how to seek care for testing (follow up with PCP or arrange E-visit).  The importance of social distancing was discussed today.  Time:   Today,  I have spent 12 minutes with the patient with telehealth technology discussing the above problems.     Medication Adjustments/Labs and Tests Ordered: Current medicines are reviewed at length with the patient today.  Concerns regarding medicines are outlined above.   Tests Ordered: Orders Placed This Encounter  Procedures   HOLTER MONITOR - 48 HOUR    Medication Changes: No orders of the defined types were placed in this encounter.   Follow Up:  In Person in 1 month(s)  Signed, [name], [place]  02/24/2019 12:11 AM    [place]

## 2019-02-22 NOTE — Patient Instructions (Signed)
Medication Instructions:  Your physician recommends that you continue on your current medications as directed. Please refer to the Current Medication list given to you today.  *If you need a refill on your cardiac medications before your next appointment, please call your pharmacy*  Lab Work: NONE ordered at this time of appointment   If you have labs (blood work) drawn today and your tests are completely normal, you will receive your results only by: [name] MyChart Message (if you have MyChart) OR . A paper copy in the mail If you have any lab test that is abnormal or we need to change your treatment, we will call you to review the results.  Testing/Procedures: Your physician has recommended that you wear a 48- hour holter monitor. Holter monitors are medical devices that record the heart's electrical activity. Doctors most often use these monitors to diagnose arrhythmias. Arrhythmias are problems with the speed or rhythm of the heartbeat. The monitor is a small, portable device. You can wear one while you do your normal daily activities. This is usually used to diagnose what is causing palpitations/syncope (passing out).    Follow-Up: At [facility], you and your health needs are our priority.  As part of our continuing mission to provide you with exceptional heart care, we have created designated Provider Care Teams.  These Care Teams include your primary Cardiologist (physician) and Advanced Practice Providers (APPs -  Physician Assistants and Nurse Practitioners) who all work together to provide you with the care you need, when you need it.  Your next appointment:   Follow up in 3-4 weeks   The format for your next appointment:   In Person  Provider:   Shelva Majestic, MD  Other Instructions

## 2019-02-23 ENCOUNTER — Telehealth: Payer: Self-pay | Admitting: *Deleted

## 2019-02-23 ENCOUNTER — Telehealth: Payer: Self-pay

## 2019-02-23 NOTE — Telephone Encounter (Signed)
LM2CB-LEFT DETAILED MESSAGE. I HAVE SCHEDULED AN APPT FOR PT TO BE SEEN IN THE OFFICE ON Friday WITH DR Claiborne Billings 02-25-2019 02/25/2019 Friday Time: 10:00 AM   PLEASE LET PT KNOW

## 2019-02-23 NOTE — Telephone Encounter (Signed)
3 day ZIO XT long term holter monitor mailed to the patients home.  Instructions reviewed briefly as they are included in the monitor kit.  Patient aware he needs to wear monitor at least 48 hours.

## 2019-02-23 NOTE — Telephone Encounter (Signed)
S/w pt informed him that I have scheduled the Dr Claiborne Billings appt on Friday he will arrive @945am  in a mask with a visitor, his friend Alexander [name]. Informed OK to bring visitor

## 2019-02-25 ENCOUNTER — Ambulatory Visit (INDEPENDENT_AMBULATORY_CARE_PROVIDER_SITE_OTHER): Payer: Medicare Other | Admitting: Cardiovascular Disease

## 2019-02-25 ENCOUNTER — Encounter: Payer: Self-pay | Admitting: Cardiovascular Disease

## 2019-02-25 ENCOUNTER — Other Ambulatory Visit: Payer: Self-pay

## 2019-02-25 VITALS — BP 135/69 | HR 56 | Ht 64.0 in | Wt 120.2 lb

## 2019-02-25 DIAGNOSIS — N1832 Chronic kidney disease, stage 3b: Secondary | ICD-10-CM

## 2019-02-25 DIAGNOSIS — R002 Palpitations: Secondary | ICD-10-CM | POA: Diagnosis not present

## 2019-02-25 DIAGNOSIS — I25810 Atherosclerosis of coronary artery bypass graft(s) without angina pectoris: Secondary | ICD-10-CM | POA: Diagnosis not present

## 2019-02-25 DIAGNOSIS — I25700 Atherosclerosis of coronary artery bypass graft(s), unspecified, with unstable angina pectoris: Secondary | ICD-10-CM

## 2019-02-25 DIAGNOSIS — Z79899 Other long term (current) drug therapy: Secondary | ICD-10-CM | POA: Diagnosis not present

## 2019-02-25 DIAGNOSIS — I493 Ventricular premature depolarization: Secondary | ICD-10-CM | POA: Diagnosis not present

## 2019-02-25 DIAGNOSIS — E782 Mixed hyperlipidemia: Secondary | ICD-10-CM

## 2019-02-25 DIAGNOSIS — D473 Essential (hemorrhagic) thrombocythemia: Secondary | ICD-10-CM

## 2019-02-25 DIAGNOSIS — D75839 Thrombocytosis, unspecified: Secondary | ICD-10-CM

## 2019-02-25 LAB — COMPREHENSIVE METABOLIC PANEL
ALT: 14 IU/L (ref 0–44)
AST: 21 IU/L (ref 0–40)
Albumin/Globulin Ratio: 1.5 (ref 1.2–2.2)
Albumin: 4.3 g/dL (ref 3.5–4.6)
Alkaline Phosphatase: 64 IU/L (ref 39–117)
BUN/Creatinine Ratio: 20 (ref 10–24)
BUN: 34 mg/dL (ref 10–36)
Bilirubin Total: 0.5 mg/dL (ref 0.0–1.2)
CO2: 22 mmol/L (ref 20–29)
Calcium: 10 mg/dL (ref 8.6–10.2)
Chloride: 107 mmol/L — ABNORMAL HIGH (ref 96–106)
Creatinine, Ser: 1.71 mg/dL — ABNORMAL HIGH (ref 0.76–1.27)
GFR calc Af Amer: 39 mL/min/{1.73_m2} — ABNORMAL LOW (ref >59)
GFR calc non Af Amer: 34 mL/min/{1.73_m2} — ABNORMAL LOW (ref >59)
Globulin, Total: 2.9 g/dL (ref 1.5–4.5)
Glucose: 95 mg/dL (ref 65–99)
Potassium: 4.6 mmol/L (ref 3.5–5.2)
Sodium: 142 mmol/L (ref 134–144)
Total Protein: 7.2 g/dL (ref 6.0–8.5)

## 2019-02-25 LAB — CBC
Hematocrit: 35.9 % — ABNORMAL LOW (ref 37.5–51.0)
Hemoglobin: 12.4 g/dL — ABNORMAL LOW (ref 13.0–17.7)
MCH: 39.6 pg — ABNORMAL HIGH (ref 26.6–33.0)
MCHC: 34.5 g/dL (ref 31.5–35.7)
MCV: 115 fL — ABNORMAL HIGH (ref 79–97)
Platelets: 348 10*3/uL (ref 150–450)
RBC: 3.13 x10E6/uL — ABNORMAL LOW (ref 4.14–5.80)
RDW: 12.3 % (ref 11.6–15.4)
WBC: 6.7 10*3/uL (ref 3.4–10.8)

## 2019-02-25 LAB — LIPID PANEL
Chol/HDL Ratio: 3.1 ratio (ref 0.0–5.0)
Cholesterol, Total: 148 mg/dL (ref 100–199)
HDL: 48 mg/dL (ref >39)
LDL Chol Calc (NIH): 83 mg/dL (ref 0–99)
Triglycerides: 88 mg/dL (ref 0–149)
VLDL Cholesterol Cal: 17 mg/dL (ref 5–40)

## 2019-02-25 LAB — MAGNESIUM: Magnesium: 2.2 mg/dL (ref 1.6–2.3)

## 2019-02-25 LAB — TSH: TSH: 2.77 u[IU]/mL (ref 0.450–4.500)

## 2019-02-25 MED ORDER — METOPROLOL TARTRATE 25 MG PO TABS: 25.0000 mg | ORAL_TABLET | Freq: Two times a day (BID) | ORAL | 3 refills | Status: DC

## 2019-02-25 NOTE — Progress Notes (Signed)
Patient ID: Alexander [name], Dr., male   DOB: 1921-06-19, 85 y.o.   MRN: [id number]     Primary M.D.: [name]  HPI: [name] is a 85 y.o. male who presents to the office for a 17 month followup cardiologic evaluation.  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  In 1996 while living in [place] he underwent CABG surgery and had a LIMA placed to the LAD, vein to the marginal, vein to the ramus intermediate, and vein to the PDA. In October 2011 catheterization by me showed severe CAD with 27 - 80% ostial left main stenosis, total occlusion of the LAD after diagonal, 95% ramus intermediate stenosis, total occlusion of the marginal branch of the circumflex coronary artery, and severe diffuse native RCA disease with total occlusion of the PDA at the ostium. A graft that supplied the marginal was occluded as was the vein graft that supplied the ramus intermediate vessel. He had patent vein graft supplying the PDA branch of his RCA and a patent LIMA to the LAD. He has been on medical therapy. I did try adding Ranexa to his medical regimen but he did not tolerate this. He does have mild renal insufficiency with creatinines in the 1.5-1.6 range which have been followed by [name] an now [name]. He has a hiatal hernia. He had been on Crestor as well as Vytorin in the past but admits to myalgias in the past on statins.  Recently, he had been on Zetia.  However, he has self discontinued the Zetia since he felt he was having some vague muscle aches.  He has renal insufficiency with creatinines in the 1.8 range.  He is now followed by [name] since [name] retired.  His last lipid panel in December 2015 showed a total cholesterol 133, triglycerides 133, LDL 64, HDL 42.    He is being followed by [name] for elevated platelets and these have now stabilized with the addition of hydroxyurea.  He was found to have a mutation in the JAK-2 gene consistent with a  diagnosis of essential thrombocythemia.   He underwent an echo Doppler study on 09/24/2015 which showed an EF of 60-65%.  There was grade 2 diastolic dysfunction and high ventricular filling pressure.  He had moderate to severely calcified aortic valve annulus with mildly calcified leaflets and mild aortic insufficiency.  He specifically denies any chest pain.  He is unaware of palpitations.  There is mild shortness of breath with activity.  Recent lipid studies were done on 03/20/2016 and showed a total cholesterol 164, HDL 31, LDL 83, VLDL 50, and triglycerides 251.  He has chronic [name] disease and is followed by nephrology.  Creatinine in August 2017 was 1.92.    He has been without chest pain.  He does note very mild shortness of breath with a walking or walking up steps.  A concern has been that of weight loss and he states that over the past year he has lost around 12-14 pounds.  He is followed by [name] and in February 2018 creatinine had risen to 2.09.  He tells me that this was recently rechecked and it had improved down to 1.9.  He admits to fatigue.  He continues to be on levothyroxine at 0.125 mg daily for hypothyroidism.  He is followed by hematology for his thrombocytosis and is on Hydrea.  When I last saw him, he was bradycardic and I recommended  reduction of his metoprolol dose down to 12.5 mg twice a day.  He has felt well with this adjustment.  He underwent an echo Doppler study on 10/07/2016, which continue to show normal systolic function with an EF of 55-60%.  There was restricted mobility of his noncoronary cusp as she is aortic valve with mild aortic insufficiency.  There was mild pulmonary hypertension with a PA pressure 37 mm.  There was grade 1 diastolic dysfunction.  His peak aortic gradient 6 mm.  Subsequent blood work in June showed further improvement in his creatinine to 1.80.  He has been having difficulty with sleep maintenance.  Typically he takes zolpidem 5 mg at  bedtime, but usually within 4 hours he is back awake and has had difficulty with sleep maintenance. He underwent surgery on his right toe due to ingrown toenail by [name] and tolerated surgery well including general anesthesia without cardiovascular compromise.    When I  saw him in April 2017, he was noticing some mild increasing shortness of breath with activity.  He denied any chest tightness. some more shortness of breath with activity.  He denies chest tightness.  He had undergone recent laboratory [name].  His hemoglobin was 12, hematocrit 36.8, and MCV 118.  Serum creatinine had risen to 2.10.  At that time, I recommended that he discontinue losartan, which she was taking 25 mg daily and recommended initiation of low-dose nitrates/hydralazine.  I scheduled him for follow-up echo Doppler study.  A follow-up bmet the day of that office visit did show improvement in his creatinine 1.72.  He has continued to be off losartan.  He never instituted the nitrates/hydralazine preparation but has the medicine at home.  A follow-up echo Doppler study on 08/19/2017 showed an EF of 60-65% with grade 1 diastolic dysfunction.  There was mild aortic sclerosis with mild AR.  There was mild pulmonary hypertension with a peak PA pressure 39 mm.  His aortic root was very mildly dilated.   Since I last saw him in May 2019, he sustained a right hip acetabular fracture in January 2020.  He subsequently went to [facility] for [facility].  He is currently living now with Alexander [name] his new partner in her independent living house.  He denies any recent anginal symptoms.  He had recently noticed palpitations and underwent a telemedicine evaluation with [name], PA.  At that time he denied any presyncope or syncope.  A 48-hour Holter monitor was ordered.  However he has not yet received this in the mail for placement.  He presents for evaluation with me today.   Past Medical History:  Diagnosis  Date   Allergic rhinitis    Anemia    Anemia, chronic renal failure 10/10/2013   Anemia, normocytic normochromic 10/10/2013   BPH (benign prostatic hyperplasia)    Calcium oxalate renal stones    CKD (chronic [name] disease), stage III    Diverticulitis    Dry senile macular degeneration    Essential thrombocythemia ([place]) 10/10/2013   GERD (gastroesophageal reflux disease)    Hearing loss in left ear    Heart murmur    Hyperlipidemia    Hypertension    Hypothyroid 07/20/2018   Migraines    OCULAR   Peripheral neuropathy    Renal lesion    Spinal stenosis    Thrombocytosis (HCC) 08/18/2012   Platelets 711,000  Hb 14, WBC 8,500 08/05/12   Vitamin deficiency     Past Surgical History:  Procedure Laterality Date   APPENDECTOMY  1942   CARDIAC CATHETERIZATION  02/19/2010   EF55%, medically treated,    CORONARY ARTERY BYPASS GRAFT  1996   INGUINAL HERNIA REPAIR     x2   LAPAROSCOPIC CHOLECYSTECTOMY  1992   left renal mass ablated  12/11   NASAL CONCHA BULLOSA RESECTION     NM MYOCAR PERF WALL MOTION  02/14/2010   post EF 71%, Exercise cap.7METS   TONSILLECTOMY  1937   TRANSTHORACIC ECHOCARDIOGRAM  12/10/2011   QZRA>07%, stage1 diastolic dysfunction, mild to mod. LAE,    TRANSTHORACIC ECHOCARDIOGRAM  07/25/2008   mild-moderate MR, mild-modTricuspid regurg.    Allergies  Allergen Reactions   Pollen Extract     ragweed    Current Outpatient Medications  Medication Sig Dispense Refill   aluminum & magnesium hydroxide-simethicone (MYLANTA) 500-450-40 MG/5ML suspension Take by mouth every 6 (six) hours as needed. 200-200-61m/5ml      aspirin 81 MG tablet Take 81 mg by mouth daily.       betamethasone dipropionate (DIPROLENE) 0.05 % ointment daily as needed.     cholecalciferol (VITAMIN D) 1000 UNITS tablet Take 2,000 Units by mouth daily.      Coenzyme Q10 (CO Q-10) 200 MG CAPS Take 1 capsule by mouth daily.      Cyanocobalamin (VITAMIN  B-12 IJ) Inject as directed. monthly      denosumab (PROLIA) 60 MG/ML SOLN injection Inject 60 mg into the skin every 6 (six) months. Administer in upper arm, thigh, or abdomen     ezetimibe (ZETIA) 10 MG tablet Take 10 mg by mouth daily.     famotidine (PEPCID) 10 MG tablet Take 10 mg by mouth daily.      finasteride (PROSCAR) 5 MG tablet Take 1 tablet by mouth 3 (three) times a week. TIW     fluticasone (FLONASE) 50 MCG/ACT nasal spray Place 1 spray into both nostrils daily as needed for allergies.      hydroxyurea (HYDREA) 500 MG capsule TAKE 1 CAPSULE BY MOUTH  DAILY MAY TAKE WITH FOOD TO MINIMIZE GI SIDE EFFECTS. 90 capsule 0   levothyroxine (SYNTHROID, LEVOTHROID) 25 MCG tablet Take 25 mcg by mouth daily before breakfast.     LUTEIN PO Take 10 mg by mouth once a week.      metoprolol tartrate (LOPRESSOR) 25 MG tablet Take 1 tablet (25 mg total) by mouth 2 (two) times daily. Take 1 tablet in morning and 1/2 in the evening depending on HR and rhythm 180 tablet 3   Multiple Vitamin (MULTIVITAMIN) tablet Take 1 tablet by mouth daily.       Multiple Vitamins-Minerals (PRESERVISION AREDS PO) Take 1 capsule by mouth daily.      traMADol (ULTRAM) 50 MG tablet Take 50 mg by mouth every 6 (six) hours as needed for moderate pain.      zolpidem (AMBIEN) 5 MG tablet Take 1 tablet (5 mg total) by mouth at bedtime as needed for sleep. OV NEEDED 30 tablet 1   No current facility-administered medications for this visit.     Social History   Socioeconomic History   Marital status: Widowed    Spouse name: Not on file   Number of children: 3   Years of education: Not on file   Highest education level: Not on file  Occupational History   Not on file  Social Needs   Financial resource strain: Not on file   Food insecurity    Worry: Not on  file    Inability: Not on file   Transportation needs    Medical: Not on file    Non-medical: Not on file  Tobacco Use   Smoking status:  Never Smoker   Smokeless tobacco: Never Used  Substance and Sexual Activity   Alcohol use: No   Drug use: No   Sexual activity: Not on file  Lifestyle   Physical activity    Days per week: Not on file    Minutes per session: Not on file   Stress: Not on file  Relationships   Social connections    Talks on phone: Not on file    Gets together: Not on file    Attends religious service: Not on file    Active member of club or organization: Not on file    Attends meetings of clubs or organizations: Not on file    Relationship status: Not on file   Intimate partner violence    Fear of current or ex partner: Not on file    Emotionally abused: Not on file    Physically abused: Not on file    Forced sexual activity: Not on file  Other Topics Concern   Not on file  Social History Narrative   Not on file    Socially he is widowed. He is retired Sales promotion account executive.  He has 3 children and 9 grandchildren. There's no tobacco use.   ROS General: Negative; No fevers, chills, or night sweats;  HEENT: Negative; No changes in vision or hearing, sinus congestion, difficulty swallowing Pulmonary: Negative; No cough, wheezing, shortness of breath, hemoptysis Cardiovascular:  See HPI; GI: Positive for a large hiatal hernia; No nausea, vomiting, diarrhea, or abdominal pain GU: History of chronic [name] disease, followed by Dr. Joelyn Oms Musculoskeletal: Negative; no myalgias, joint pain, or weakness Hematologic/Oncology: He was followed by [name] for thrombocytosis, and since he retired now sees Dr. Ammie Dalton; no easy bruising, bleeding Endocrine: Negative; no heat/cold intolerance; no diabetes Neuro: Negative; no changes in balance, headaches Skin: Negative; No rashes or skin lesions Psychiatric: Negative; No behavioral problems, depression Sleep: Negative; No snoring, daytime sleepiness, hypersomnolence, bruxism, restless legs, hypnogognic hallucinations, no  cataplexy Other comprehensive 14 point system review is negative.   PE BP 135/69    Pulse (!) 56    Ht 5' 4"  (1.626 m)    Wt 120 lb 3.2 oz (54.5 kg)    SpO2 94%    BMI 20.63 kg/m    Repeat blood pressure by me was 118/70  Wt Readings from Last 3 Encounters:  02/25/19 120 lb 3.2 oz (54.5 kg)  02/22/19 119 lb (54 kg)  11/29/18 119 lb 6.4 oz (54.2 kg)   General: Alert, oriented, no distress.  Skin: normal turgor, no rashes, warm and dry HEENT: Normocephalic, atraumatic. Pupils equal round and reactive to light; sclera anicteric; extraocular muscles intact;  Nose without nasal septal hypertrophy Mouth/Parynx benign; Mallinpatti scale 3 Neck: No JVD, no carotid bruits; normal carotid upstroke Lungs: clear to ausculatation and percussion; no wheezing or rales Chest wall: without tenderness to palpitation Heart: PMI not displaced, occasional PVCs with transient trigeminy, s1 s2 normal, 1/6 systolic murmur, no diastolic murmur, no rubs, gallops, thrills, or heaves Abdomen: soft, nontender; no hepatosplenomehaly, BS+; abdominal aorta nontender and not dilated by palpation. Back: no CVA tenderness Pulses 2+ Musculoskeletal: full range of motion, normal strength, no joint deformities Extremities: no clubbing cyanosis or edema, Homan's sign negative  Neurologic: grossly nonfocal; Cranial nerves grossly wnl  Psychologic: Normal mood and affect   ECG (independently read by me): Sinus bradycardia 56 bpm with ventricular trigeminy.  08/05/2017 ECG (independently read by me): Sinus bradycardia 56 bpm.  No ectopy.  Normal intervals  September 2018 ECG (independently read by me): Normal sinus rhythm at 64 bpm.  Probable left atrial dilatation.  Poor anterior R-wave progression.  QTc interval 410 ms.  May 2018 ECG (independently read by me): Sinus bradycardia at 48 bpm.  PR interval 194 ms.  QTc interval 416 ms.  No ST segment changes.  November 2017 ECG (independently read by me): Sinus  bradycardia 51 bpm.  No ectopy.  Normal intervals.  April 2017 ECG (independently read by me): Sinus bradycardia 53 bpm.  Poor progression anteriorly.  October 2016 ECG (independently read by me): Sinus bradycardia 54 bpm.  Poor progression V1 through V3.  Normal intervals.  September 2015 ECG (independently read by me): Sinus bradycardia 52 beats per minute.  Normal intervals.  No ST segment changes.  06/08/2013 ECG: Sinus bradycardia 49 beats per minute. No ectopy. Normal intervals.  Prior ECG of 11/30/2012: Sinus rhythm at 53 beats per minute. Normal intervals.  LABS: BMP Latest Ref Rng & Units 02/25/2019 07/20/2018 05/12/2018  Glucose 65 - 99 mg/dL 95 96 131(H)  BUN 10 - 36 mg/dL 34 29 30(H)  Creatinine 0.76 - 1.27 mg/dL 1.71(H) 1.66(H) 1.70(H)  BUN/Creat Ratio 10 - 24 20 17  -  Sodium 134 - 144 mmol/L 142 141 142  Potassium 3.5 - 5.2 mmol/L 4.6 4.7 4.3  Chloride 96 - 106 mmol/L 107(H) 106 108  CO2 20 - 29 mmol/L 22 18(L) 25  Calcium 8.6 - 10.2 mg/dL 10.0 8.6 8.6(L)   Hepatic Function Latest Ref Rng & Units 02/25/2019 07/20/2018 12/23/2017  Total Protein 6.0 - 8.5 g/dL 7.2 6.6 7.0  Albumin 3.5 - 4.6 g/dL 4.3 3.9 4.1  AST 0 - 40 IU/L 21 20 23   ALT 0 - 44 IU/L 14 16 17   Alk Phosphatase 39 - 117 IU/L 64 83 57  Total Bilirubin 0.0 - 1.2 mg/dL 0.5 0.4 0.6   CBC Latest Ref Rng & Units 02/25/2019 11/29/2018 07/20/2018  WBC 3.4 - 10.8 x10E3/uL 6.7 4.9 4.9  Hemoglobin 13.0 - 17.7 g/dL 12.4(L) 11.3(L) 11.7(L)  Hematocrit 37.5 - 51.0 % 35.9(L) 34.8(L) 33.3(L)  Platelets 150 - 450 x10E3/uL 348 291 311   Lab Results  Component Value Date   MCV 115 (H) 02/25/2019   MCV 120.8 (H) 11/29/2018   MCV 113 (H) 07/20/2018   Lab Results  Component Value Date   TSH 2.770 02/25/2019   Lab Results  Component Value Date   HGBA1C 5.5 12/23/2017     Lipid Panel     Component Value Date/Time   CHOL 148 02/25/2019 1158   TRIG 88 02/25/2019 1158   HDL 48 02/25/2019 1158   CHOLHDL 3.1  02/25/2019 1158   CHOLHDL 5.3 (H) 03/20/2016 1109   VLDL 50 (H) 03/20/2016 1109   LDLCALC 83 02/25/2019 1158    RADIOLOGY: No results found.   IMPRESSION:  1. Palpitations   2. PVC (premature ventricular contraction) with trigeminy   3. Coronary artery disease involving coronary bypass graft of native heart without angina pectoris   4. Medication management   5. Mixed hyperlipidemia   6. Stage 3b chronic [name] disease   7. 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 He has documented graft occlusion of 2 of his grafts and has severe native coronary artery disease. Remotely, we tried adding Ranexa to his medication but he has stopped this due to concerns of tolerability.  He is on a reduced dose of metoprolol due to previous documentation of bradycardia.  His last echo Doppler study showed normal systolic function with grade 1 diastolic dysfunction, mild pulmonary hypertension with PA peak pressure 39 mm.  He has a history of chronic [name] disease which led to discontinuance of his losartan.  His blood pressure today is stable and he has been currently taking metoprolol tartrate 25 mg in the morning and 12.5 mg in the evening depending upon his heart rate and rhythm.  His ECG today confirms he is having PVCs and there was a ventricular trigeminal rhythm detected.  On prolonged auscultation, oftentimes he had regular rhythm throughout but then would only have an occasional PVC.  He is awaiting have a 48-hour Holter monitor but this has not yet been received in the mail for him to monitoring.  He sustained a right hip acetabular fracture in January and since that time is on Prolia to improve bone strength.  He continues to be on low-dose levothyroxine at 25 mcg.  He continues to be on hydroxyurea for his  thrombocytosis with most recent laboratory obtained by Dr. Benay Spice showing a platelet count normal at 291.  He has significant macrocytosis with MCV of 120 with mild anemia with a hemoglobin of 11.3 and hematocrit of 34.8.  I have recommended follow-up laboratory with a comprehensive metabolic panel, magnesium level, TSH, CBC, and lipid studies.  I have recommended a follow-up echo Doppler study to be obtained to reassess LV function particularly in the setting of his recent ectopy.  I will see him in the office in 2 months for reevaluation.     Time spent: 25 minutes Troy Sine, MD, [facility]  02/27/2019 12:02 PM

## 2019-02-25 NOTE — Patient Instructions (Signed)
Medication Instructions:  The current medical regimen is effective;  continue present plan and medications as directed. Please refer to the Current Medication list given to you today. If you need a refill on your cardiac medications before your next appointment, please call your pharmacy.  Labwork: CMET,CBC,TSH,MAG, LIPID HERE IN OUR OFFICE AT [facility]    If you have labs (blood work) drawn today and your tests are completely normal, you will receive your results only by: [name] MyChart Message (if you have MyChart) OR . A paper copy in the mail If you have any lab test that is abnormal or we need to change your treatment, we will call you to review the results.  Testing/Procedures: Echocardiogram IN 4-6 WEEKS - Your physician has requested that you have an echocardiogram. Echocardiography is a painless test that uses sound waves to create images of your heart. It provides your doctor with information about the size and shape of your heart and how well your heart's chambers and valves are working. This procedure takes approximately one hour. There are no restrictions for this procedure. This will be performed at our [facility] location - [address].  Follow-Up: IN 2 MONTHS-AFTER ECHO Please call our office 2 months in [place] may see Shelva Majestic, MD or one of the following Advanced Practice Providers on your designated Care Team:  Rosaria Ferries, PA-C Jory Sims, DNP, ANP  Cadence Kathlen Mody, NP.    At [facility], you and your health needs are our priority.  As part of our continuing mission to provide you with exceptional heart care, we have created designated Provider Care Teams.  These Care Teams include your primary Cardiologist (physician) and Advanced Practice Providers (APPs -  Physician Assistants and Nurse Practitioners) who all work together to provide you with the care you need, when you need it.  Thank you for choosing [facility] at [facility]!!

## 2019-02-27 ENCOUNTER — Encounter: Payer: Self-pay | Admitting: Cardiovascular Disease

## 2019-03-02 ENCOUNTER — Inpatient Hospital Stay: Payer: Medicare Other | Attending: Oncology

## 2019-03-11 DIAGNOSIS — D519 Vitamin B12 deficiency anemia, unspecified: Secondary | ICD-10-CM | POA: Diagnosis not present

## 2019-03-11 DIAGNOSIS — R634 Abnormal weight loss: Secondary | ICD-10-CM | POA: Diagnosis not present

## 2019-03-11 DIAGNOSIS — W19XXXA Unspecified fall, initial encounter: Secondary | ICD-10-CM | POA: Diagnosis not present

## 2019-03-11 DIAGNOSIS — N183 Chronic kidney disease, stage 3 unspecified: Secondary | ICD-10-CM | POA: Diagnosis not present

## 2019-03-11 DIAGNOSIS — M810 Age-related osteoporosis without current pathological fracture: Secondary | ICD-10-CM | POA: Diagnosis not present

## 2019-03-11 DIAGNOSIS — D473 Essential (hemorrhagic) thrombocythemia: Secondary | ICD-10-CM | POA: Diagnosis not present

## 2019-03-11 DIAGNOSIS — M4850XS Collapsed vertebra, not elsewhere classified, site unspecified, sequela of fracture: Secondary | ICD-10-CM | POA: Diagnosis not present

## 2019-03-11 DIAGNOSIS — R2689 Other abnormalities of gait and mobility: Secondary | ICD-10-CM | POA: Diagnosis not present

## 2019-03-11 DIAGNOSIS — E038 Other specified hypothyroidism: Secondary | ICD-10-CM | POA: Diagnosis not present

## 2019-03-11 DIAGNOSIS — I2510 Atherosclerotic heart disease of native coronary artery without angina pectoris: Secondary | ICD-10-CM | POA: Diagnosis not present

## 2019-04-01 DIAGNOSIS — Z20828 Contact with and (suspected) exposure to other viral communicable diseases: Secondary | ICD-10-CM | POA: Diagnosis not present

## 2019-04-02 ENCOUNTER — Telehealth: Payer: Self-pay | Admitting: Pulmonary Disease

## 2019-04-02 NOTE — Telephone Encounter (Signed)
Pulmonary Telephone Encounter  Discussed with patient's son regarding recent cough that began 2 days ago. COVID testing is pending but son wished to discuss options regarding Bamlanivimab, the monoclonal antibody. Based on patient's age and cardiovascular issues, he would be considered high risk and would qualify if his COVID test is positive.  We discussed outpatient management of COVID-19 including Bamlanivimab, which is a monoclonal antibody that is authorized under emergency use by the FDA for mild to moderate symptoms of COVID-19 as an outpatient. Benefits were discussed including reduced viral load and reduced ED visits/hospitalizations as well as risks including anaphylaxis, nausea, diarrhea, dizziness, headache, pruritus, and vomiting. Long term risks are not known at this time.   He expressed understanding. Our office will contact him on the next available business day to check on COVID test status and if positive, will arrange to be seen at [facility] [phone]).  Rodman Pickle, M.D. [facility] Pulmonary/Critical Care Medicine 04/02/2019 1:26 PM

## 2019-04-04 ENCOUNTER — Encounter (HOSPITAL_COMMUNITY): Payer: Medicare Other

## 2019-04-04 NOTE — Telephone Encounter (Signed)
Spoke with Son,  States Dad was negative. Is going to get another test and will call with results.   Will route to [place] as fyi

## 2019-04-05 IMAGING — MR MR CERVICAL SPINE W/O CM
5 series · 48 of 48 positions shown · non-contrast
Comparison: None.

CLINICAL DATA: Left-sided neck pain for several weeks. Tingling in
the left upper arm and left thumb.

EXAM:
MRI CERVICAL SPINE WITHOUT CONTRAST
TECHNIQUE: Multiplanar, multisequence MR imaging of the cervical spine was
performed. No intravenous contrast was administered.

[Series 3: T2 · sagittal · 3.0mm · 0.82mm/px · 6 of 12 slices shown (1 of 2)]
[im 1/12]
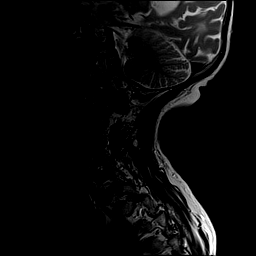
[im 3/12]
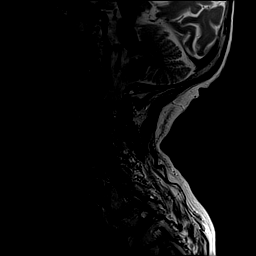
[im 5/12]
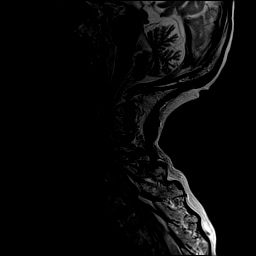
[im 7/12]
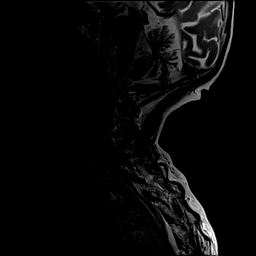
[im 9/12]
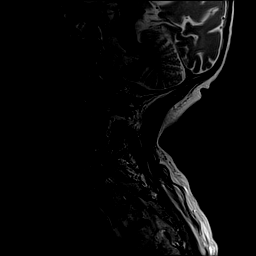
[im 12/12]
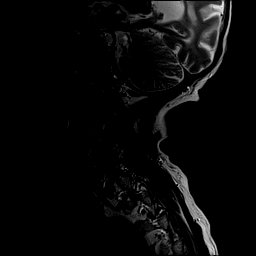

[Series 4: T1 · sagittal · 3.0mm · 0.82mm/px · 6 of 12 slices shown]
[im 1/12]
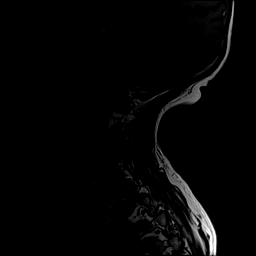
[im 3/12]
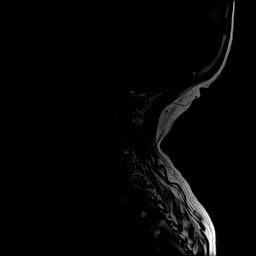
[im 5/12]
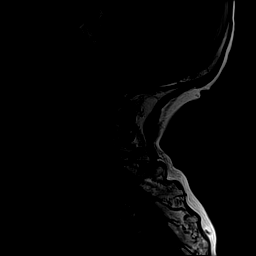
[im 7/12]
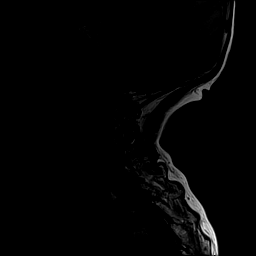
[im 9/12]
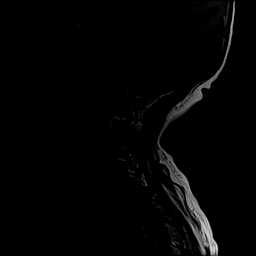
[im 12/12]
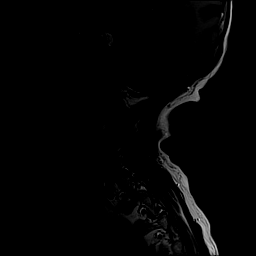

[Series 5: STIR · sagittal · 3.0mm · 0.82mm/px · 6 of 12 slices shown]
[im 1/12]
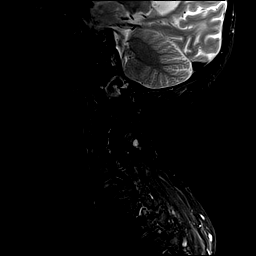
[im 3/12]
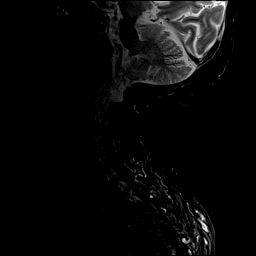
[im 5/12]
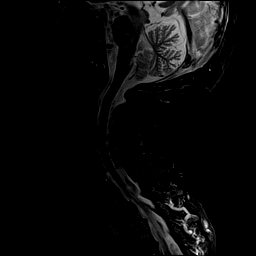
[im 7/12]
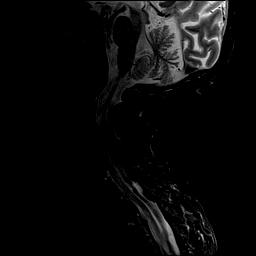
[im 9/12]
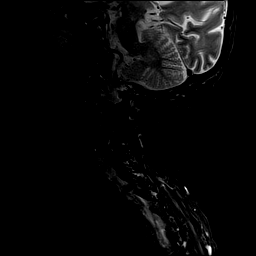
[im 12/12]
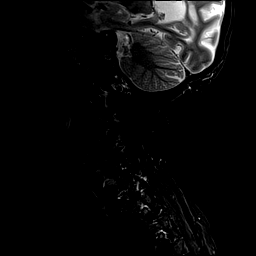

[Series 6: GRE · axial · 3.5mm · 0.74mm/px · z∈[-121,-9]mm · 15 of 28 slices shown]
[im 1/28]
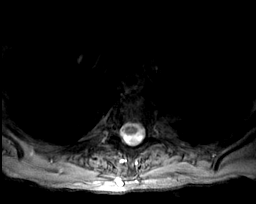
[im 2/28]
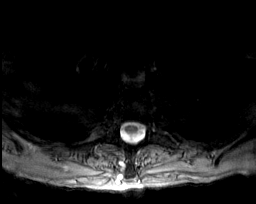
[im 4/28]
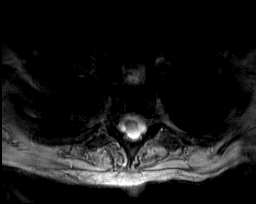
[im 6/28]
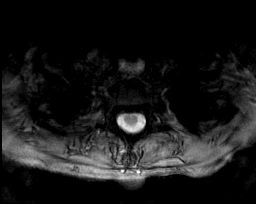
[im 8/28]
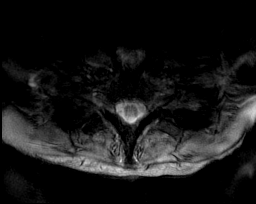
[im 10/28]
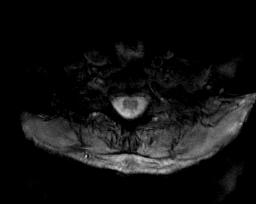
[im 12/28]
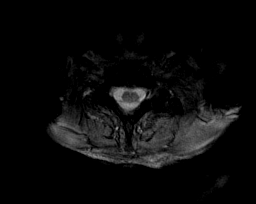
[im 14/28]
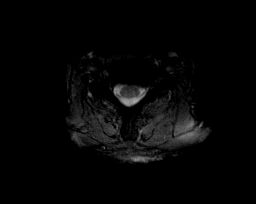
[im 16/28]
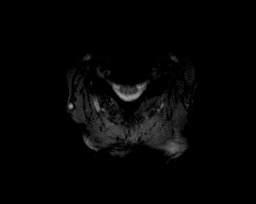
[im 18/28]
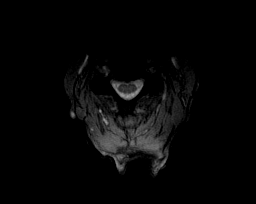
[im 20/28]
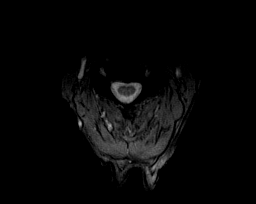
[im 22/28]
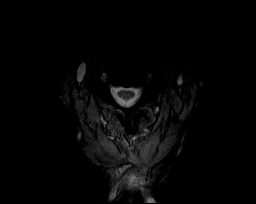
[im 24/28]
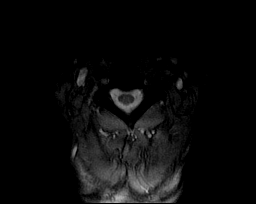
[im 26/28]
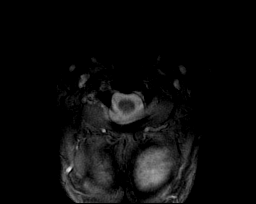
[im 28/28]
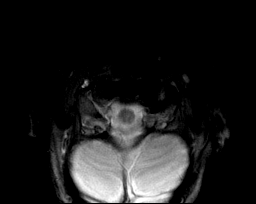

[Series 7: T2 · axial · 3.5mm · 0.74mm/px · z∈[-121,-9]mm · 15 of 28 slices shown (2 of 2)]
[im 1/28]
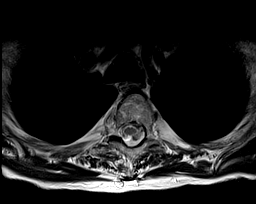
[im 2/28]
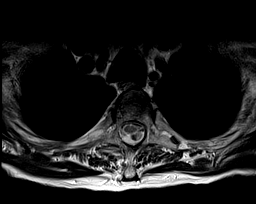
[im 4/28]
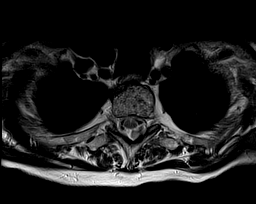
[im 6/28]
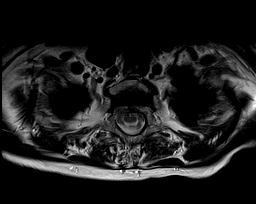
[im 8/28]
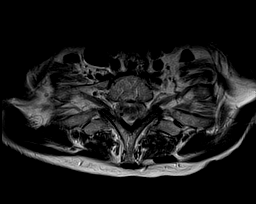
[im 10/28]
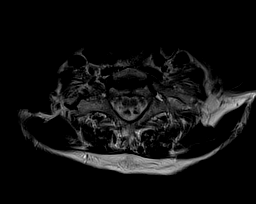
[im 12/28]
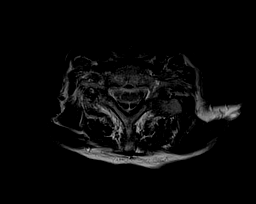
[im 14/28]
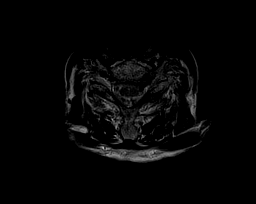
[im 16/28]
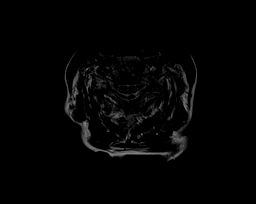
[im 18/28]
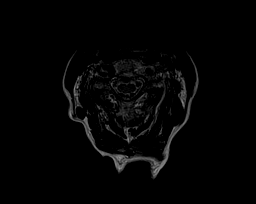
[im 20/28]
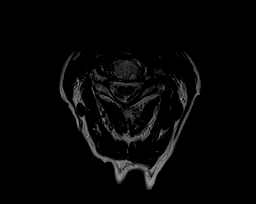
[im 22/28]
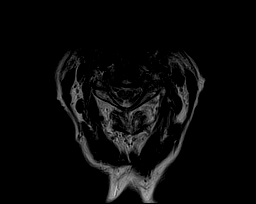
[im 24/28]
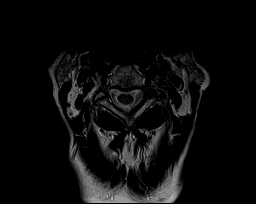
[im 26/28]
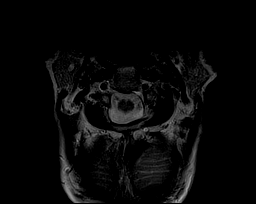
[im 28/28]
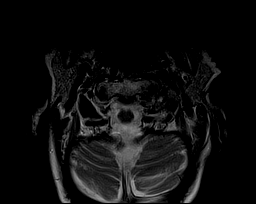

[48 of 48 positions shown; findings below may reference images not displayed]

FINDINGS: Alignment: 2 mm of anterolisthesis at C2-3, C7-T1, and T1-2.

Vertebrae: No acute fracture, evidence of discitis, or bone lesion.
Remote T2 body fracture with moderate height loss.

Cord: Normal signal and morphology.

Posterior Fossa, vertebral arteries, paraspinal tissues: Negative

Disc levels:

C2-3: Mild facet spurring with anterolisthesis. Disc narrowing with
mild bulging and uncovertebral spurring. Patent canal and foramina

C3-4: Degenerative disc narrowing and bulging with endplate ridging.
Mild right and moderate to advanced left foraminal stenosis. Patent
spinal canal

C4-5: Degenerative disc narrowing with endplate and uncovertebral
ridging. The disc is bulging posteriorly. Advanced biforaminal
impingement. Facet spurring is mild. Patent spinal canal.

C5-6: Advanced degenerative disc narrowing with endplate and
uncovertebral ridging. Ligamentum flavum is thickened and buckled on
reformats. No cord impingement. Mild bilateral foraminal narrowing
(see [DATE]).

C6-7: Degenerative disc narrowing with endplate degeneration and
left more than right uncovertebral spurring. Mild right and moderate
left foraminal stenosis. Minor facet spurring. Patent spinal canal.

C7-T1:Facet degeneration with mild anterolisthesis. Tiny central
protrusion. The canal and foramina are patent
IMPRESSION: 1. Diffuse disc and facet degeneration with multilevel listhesis.
2. On the symptomatic left side there is foraminal impingement
greatest at C3-4, C4-5, and C6-7.
3. On the right there is advanced foraminal impingement at C4-5.
4. Diffusely patent spinal canal.
5. Remote T2 body compression fracture.

## 2019-04-08 ENCOUNTER — Encounter (HOSPITAL_COMMUNITY): Payer: Self-pay | Admitting: Cardiovascular Disease

## 2019-04-08 ENCOUNTER — Other Ambulatory Visit (HOSPITAL_COMMUNITY): Payer: Medicare Other

## 2019-04-14 ENCOUNTER — Encounter (HOSPITAL_COMMUNITY): Payer: Self-pay

## 2019-04-14 ENCOUNTER — Encounter (HOSPITAL_COMMUNITY)
Admission: RE | Admit: 2019-04-14 | Discharge: 2019-04-14 | Disposition: A | Discharge status: Home / Self Care | Payer: Medicare Other | Source: Ambulatory Visit | Attending: Internal Medicine | Admitting: Internal Medicine

## 2019-04-14 ENCOUNTER — Other Ambulatory Visit: Payer: Self-pay

## 2019-04-14 DIAGNOSIS — M810 Age-related osteoporosis without current pathological fracture: Secondary | ICD-10-CM | POA: Diagnosis not present

## 2019-04-14 MED ORDER — DENOSUMAB 60 MG/ML ~~LOC~~ SOSY
60.0000 mg | PREFILLED_SYRINGE | Freq: Once | SUBCUTANEOUS | Status: AC
  Administered 2019-04-14: 60 mg via SUBCUTANEOUS
  Filled 2019-04-14: qty 1

## 2019-04-14 NOTE — Discharge Instructions (Signed)
Denosumab injection °What is this medicine? °DENOSUMAB (den oh sue mab) slows bone breakdown. Prolia is used to treat osteoporosis in women after menopause and in men, and in people who are taking corticosteroids for 6 months or more. Xgeva is used to treat a high calcium level due to cancer and to prevent bone fractures and other bone problems caused by multiple myeloma or cancer bone metastases. Xgeva is also used to treat giant cell tumor of the bone. °This medicine may be used for other purposes; ask your health care provider or pharmacist if you have questions. °COMMON BRAND NAME(S): Prolia, XGEVA °What should I tell my health care provider before I take this medicine? °They need to know if you have any of these conditions: °· dental disease °· having surgery or tooth extraction °· infection °· kidney disease °· low levels of calcium or Vitamin D in the blood °· malnutrition °· on hemodialysis °· skin conditions or sensitivity °· thyroid or parathyroid disease °· an unusual reaction to denosumab, other medicines, foods, dyes, or preservatives °· pregnant or trying to get pregnant °· breast-feeding °How should I use this medicine? °This medicine is for injection under the skin. It is given by a health care professional in a hospital or clinic setting. °A special MedGuide will be given to you before each treatment. Be sure to read this information carefully each time. °For Prolia, talk to your pediatrician regarding the use of this medicine in children. Special care may be needed. For Xgeva, talk to your pediatrician regarding the use of this medicine in children. While this drug may be prescribed for children as young as 13 years for selected conditions, precautions do apply. °Overdosage: If you think you have taken too much of this medicine contact a poison control center or emergency room at once. °NOTE: This medicine is only for you. Do not share this medicine with others. °What if I miss a dose? °It is  important not to miss your dose. Call your doctor or health care professional if you are unable to keep an appointment. °What may interact with this medicine? °Do not take this medicine with any of the following medications: °· other medicines containing denosumab °This medicine may also interact with the following medications: °· medicines that lower your chance of fighting infection °· steroid medicines like prednisone or cortisone °This list may not describe all possible interactions. Give your health care provider a list of all the medicines, herbs, non-prescription drugs, or dietary supplements you use. Also tell them if you smoke, drink alcohol, or use illegal drugs. Some items may interact with your medicine. °What should I watch for while using this medicine? °Visit your doctor or health care professional for regular checks on your progress. Your doctor or health care professional may order blood tests and other tests to see how you are doing. °Call your doctor or health care professional for advice if you get a fever, chills or sore throat, or other symptoms of a cold or flu. Do not treat yourself. This drug may decrease your body's ability to fight infection. Try to avoid being around people who are sick. °You should make sure you get enough calcium and vitamin D while you are taking this medicine, unless your doctor tells you not to. Discuss the foods you eat and the vitamins you take with your health care professional. °See your dentist regularly. Brush and floss your teeth as directed. Before you have any dental work done, tell your dentist you are   receiving this medicine. Do not become pregnant while taking this medicine or for 5 months after stopping it. Talk with your doctor or health care professional about your birth control options while taking this medicine. Women should inform their doctor if they wish to become pregnant or think they might be pregnant. There is a potential for serious side  effects to an unborn child. Talk to your health care professional or pharmacist for more information. What side effects may I notice from receiving this medicine? Side effects that you should report to your doctor or health care professional as soon as possible:  allergic reactions like skin rash, itching or hives, swelling of the face, lips, or tongue  bone pain  breathing problems  dizziness  jaw pain, especially after dental work  redness, blistering, peeling of the skin  signs and symptoms of infection like fever or chills; cough; sore throat; pain or trouble passing urine  signs of low calcium like fast heartbeat, muscle cramps or muscle pain; pain, tingling, numbness in the hands or feet; seizures  unusual bleeding or bruising  unusually weak or tired Side effects that usually do not require medical attention (report to your doctor or health care professional if they continue or are bothersome):  constipation  diarrhea  headache  joint pain  loss of appetite  muscle pain  runny nose  tiredness  upset stomach This list may not describe all possible side effects. Call your doctor for medical advice about side effects. You may report side effects to FDA at 1-800-FDA-1088. Where should I keep my medicine? This medicine is only given in a clinic, doctor's office, or other health care setting and will not be stored at home. NOTE: This sheet is a summary. It may not cover all possible information. If you have questions about this medicine, talk to your doctor, pharmacist, or health care provider.  2020 [employer] (2017-08-28 16:10:44)  

## 2019-04-25 ENCOUNTER — Other Ambulatory Visit: Payer: Self-pay

## 2019-04-25 ENCOUNTER — Ambulatory Visit (HOSPITAL_COMMUNITY): Payer: Medicare Other | Attending: Cardiovascular Disease

## 2019-04-25 DIAGNOSIS — I493 Ventricular premature depolarization: Secondary | ICD-10-CM | POA: Diagnosis present

## 2019-04-25 DIAGNOSIS — Z79899 Other long term (current) drug therapy: Secondary | ICD-10-CM | POA: Diagnosis present

## 2019-04-25 DIAGNOSIS — I25810 Atherosclerosis of coronary artery bypass graft(s) without angina pectoris: Secondary | ICD-10-CM | POA: Diagnosis present

## 2019-06-02 ENCOUNTER — Inpatient Hospital Stay: Payer: Medicare Other | Attending: Oncology | Admitting: Oncology

## 2019-06-02 ENCOUNTER — Inpatient Hospital Stay: Payer: Medicare Other

## 2019-06-15 ENCOUNTER — Ambulatory Visit: Payer: Medicare Other | Admitting: Cardiovascular Disease

## 2019-06-17 DIAGNOSIS — H353232 Exudative age-related macular degeneration, bilateral, with inactive choroidal neovascularization: Secondary | ICD-10-CM | POA: Diagnosis not present

## 2019-06-17 DIAGNOSIS — H524 Presbyopia: Secondary | ICD-10-CM | POA: Diagnosis not present

## 2019-06-22 ENCOUNTER — Telehealth: Payer: Self-pay

## 2019-06-22 DIAGNOSIS — I129 Hypertensive chronic kidney disease with stage 1 through stage 4 chronic kidney disease, or unspecified chronic kidney disease: Secondary | ICD-10-CM | POA: Diagnosis not present

## 2019-06-22 DIAGNOSIS — N183 Chronic kidney disease, stage 3 unspecified: Secondary | ICD-10-CM | POA: Diagnosis not present

## 2019-06-22 NOTE — Telephone Encounter (Signed)
Multiple staff members attempted to get in contact with patient with regards to changing office visit to virtual visit or rescheduling visit due to inclement weather tomorrow 06/23/19. No answer and phone kept ringing with no voicemail box to leave message on.

## 2019-06-23 ENCOUNTER — Telehealth: Payer: Self-pay

## 2019-06-23 ENCOUNTER — Encounter: Payer: Self-pay | Admitting: Cardiovascular Disease

## 2019-06-23 ENCOUNTER — Telehealth (INDEPENDENT_AMBULATORY_CARE_PROVIDER_SITE_OTHER): Payer: Medicare Other | Admitting: Cardiovascular Disease

## 2019-06-23 VITALS — Wt 120.0 lb

## 2019-06-23 DIAGNOSIS — N183 Chronic kidney disease, stage 3 unspecified: Secondary | ICD-10-CM

## 2019-06-23 DIAGNOSIS — E039 Hypothyroidism, unspecified: Secondary | ICD-10-CM

## 2019-06-23 DIAGNOSIS — Z951 Presence of aortocoronary bypass graft: Secondary | ICD-10-CM

## 2019-06-23 DIAGNOSIS — I25810 Atherosclerosis of coronary artery bypass graft(s) without angina pectoris: Secondary | ICD-10-CM | POA: Diagnosis not present

## 2019-06-23 DIAGNOSIS — I493 Ventricular premature depolarization: Secondary | ICD-10-CM

## 2019-06-23 NOTE — Progress Notes (Signed)
Virtual Visit via Telephone Note   This visit type was conducted due to national recommendations for restrictions regarding the COVID-19 Pandemic (e.g. social distancing) in an effort to limit this patient's exposure and mitigate transmission in our community.  Due to his co-morbid illnesses, this patient is at least at moderate risk for complications without adequate follow up.  This format is felt to be most appropriate for this patient at this time.  The patient did not have access to video technology/had technical difficulties with video requiring transitioning to audio format only (telephone).  All issues noted in this document were discussed and addressed.  No physical exam could be performed with this format.  Please refer to the patient's chart for his  consent to telehealth for [facility].   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  In 1996 while living in [place] he underwent CABG surgery and had a LIMA placed to the LAD, vein to the marginal, vein to the ramus intermediate, and vein to the PDA. In October 2011 catheterization by me showed severe CAD with 3 - 80% ostial left main stenosis, total occlusion of the LAD after diagonal, 95% ramus intermediate stenosis, total occlusion of the marginal branch of the circumflex coronary artery, and severe diffuse native RCA disease with total occlusion of the PDA at the ostium. A graft that supplied the marginal was occluded as was the vein graft that supplied the ramus intermediate vessel. He had patent vein graft supplying the PDA branch of his RCA and a  patent LIMA to the LAD. He has been on medical therapy. I did try adding Ranexa to his medical regimen but he did not tolerate this. He does have mild renal insufficiency with creatinines in the 1.5-1.6 range which have been followed by Dr. Hassell [name] an now Dr Alexander [name]. He has a hiatal hernia. He had been on Crestor as well as Vytorin in the past but admits to myalgias in the past on statins.  Recently, he had been on Zetia.  However, he has self discontinued the Zetia since he felt he was having some vague muscle aches.  He has renal insufficiency with creatinines in the 1.8 range.  He is now followed by Dr. Corbin [name] since Dr. Hassell [name] retired.  His lipid panel in December 2015 showed a total cholesterol 133, triglycerides 133, LDL 64, HDL 42.   He was followed by Dr. Beryle [name] for elevated platelets and these have now stabilized with the addition of hydroxyurea.  He was found to have a mutation in the JAK-2 gene consistent with a diagnosis of essential thrombocythemia.  Since Dr. Azucena [name] retirement he is followed by Dr. Benay [name] and continues to be on hydroxyurea.   He underwent an echo Doppler study on 09/24/2015 which showed an EF of 60-65%.  There was grade 2 diastolic dysfunction and high ventricular filling pressure.  He had moderate to severely calcified aortic valve annulus with mildly calcified leaflets and mild aortic insufficiency.  He specifically denies any chest pain.  He is unaware of palpitations.  There is mild shortness of breath  with activity.  Recent lipid studies were [name] on 03/20/2016 and showed a total cholesterol 164, HDL 31, LDL 83, VLDL 50, and triglycerides 251.  He has chronic [name] disease and is followed by nephrology.  Creatinine in August 2017 was 1.92.    He has been without chest pain.  He does note very mild shortness of breath with a walking or walking up steps.  A concern has been that of weight loss and he states that over the past year he has lost around 12-14 pounds.   He is followed by Dr. Baird [name] and in February 2018 creatinine had risen to 2.09.  He tells me that this was recently rechecked and it had improved down to 1.9.  He admits to fatigue.  He continues to be on levothyroxine at 0.125 mg daily for hypothyroidism.  He is followed by hematology for his thrombocytosis and is on Hydrea.  When I last saw him, he was bradycardic and I recommended reduction of his metoprolol dose down to 12.5 mg twice a day.  He has felt well with this adjustment.  He underwent an echo Doppler study on 10/07/2016, which continue to show normal systolic function with an EF of 55-60%.  There was restricted mobility of his noncoronary cusp as she is aortic valve with mild aortic insufficiency.  There was mild pulmonary hypertension with a PA pressure 37 mm.  There was grade 1 diastolic dysfunction.  His peak aortic gradient 6 mm.  Subsequent blood work in June showed further improvement in his creatinine to 1.80.  He has been having difficulty with sleep maintenance.  Typically he takes zolpidem 5 mg at bedtime, but usually within 4 hours he is back awake and has had difficulty with sleep maintenance. He underwent surgery on his right toe due to ingrown toenail by Alexander [name] and tolerated surgery well including general anesthesia without cardiovascular compromise.    When I  saw him in April 2017, he was noticing some mild increasing shortness of breath with activity.  He denied any chest tightness. some more shortness of breath with activity.  He denies chest tightness.  He had undergone recent laboratory Dr. Beryle [name].  His hemoglobin was 12, hematocrit 36.8, and MCV 118.  Serum creatinine had risen to 2.10.  At that time, I recommended that he discontinue losartan, which she was taking 25 mg daily and recommended initiation of low-dose nitrates/hydralazine.  I scheduled him for follow-up echo Doppler study.  A follow-up bmet the day of that office visit did show improvement in his  creatinine 1.72.  He has continued to be off losartan.  He never instituted the nitrates/hydralazine preparation but has the medicine at home.  A follow-up echo Doppler study on 08/19/2017 showed an EF of 60-65% with grade 1 diastolic dysfunction.  There was mild aortic sclerosis with mild AR.  There was mild pulmonary hypertension with a peak PA pressure 39 mm.  His aortic root was very mildly dilated.   Since I  saw him in May 2019, he sustained a right hip acetabular fracture in January 2020.  He subsequently went to [facility].    I last saw him in October 2020. He is currently living now with Alexander [name] his new partner in her independent living house.  He denies any recent anginal symptoms.  He had recently noticed palpitations and underwent a telemedicine evaluation with [name], PA.  At that time he denied any presyncope or syncope.  A 48-hour Holter  monitor was ordered.   Since he is over October 2020 evaluation, he states he has continued to feel well.  He underwent a follow-up echo Doppler study in December 2020 which continued to show normal LV function with EF 60 to 65% with  grade 1 diastolic dysfunction.  He did not wall motion abnormalities.  There was mild to moderate TR, mild aortic valve sclerosis with mild to moderate aortic insufficiency.  There was mild dilation of his aortic root at 40 mm.  Or shortness of breath.  He notes a rare palpitation but he denies any associated presyncope or sustained rhythm abnormality.  He ultimately received the Zio monitor but he never placed this on since he was uncertain how to do this.  Laboratory in October 2020 showed total cholesterol 148, triglycerides 88, LDL 83.  He has macrocytosis with MCV at 115.  Platelet counts have been normal.  The patient does not have symptoms concerning for COVID-19 infection (fever, chills, cough, or new shortness of breath).    Past Medical History:  Diagnosis Date  . Allergic  rhinitis   . Anemia   . Anemia, chronic renal failure 10/10/2013  . Anemia, normocytic normochromic 10/10/2013  . BPH (benign prostatic hyperplasia)   . Calcium oxalate renal stones   . CKD (chronic [name] disease), stage III   . Diverticulitis   . Dry senile macular degeneration   . Essential thrombocythemia ([place]) 10/10/2013  . GERD (gastroesophageal reflux disease)   . Hearing loss in left ear   . Heart murmur   . Hyperlipidemia   . Hypertension   . Hypothyroid 07/20/2018  . Migraines    OCULAR  . Peripheral neuropathy   . Renal lesion   . Spinal stenosis   . Thrombocytosis ([place]) 08/18/2012   Platelets 711,000  Hb 14, WBC 8,500 08/05/12  . Vitamin deficiency    Past Surgical History:  Procedure Laterality Date  . APPENDECTOMY  1942  . CARDIAC CATHETERIZATION  02/19/2010   EF55%, medically treated,   . CORONARY ARTERY BYPASS GRAFT  1996  . INGUINAL HERNIA REPAIR     x2  . LAPAROSCOPIC CHOLECYSTECTOMY  1992  . left renal mass ablated  12/11  . NASAL CONCHA BULLOSA RESECTION    . NM MYOCAR PERF WALL MOTION  02/14/2010   post EF 71%, Exercise cap.7METS  . TONSILLECTOMY  1937  . TRANSTHORACIC ECHOCARDIOGRAM  12/10/2011   IRJJ>88%, stage1 diastolic dysfunction, mild to mod. LAE,   . TRANSTHORACIC ECHOCARDIOGRAM  07/25/2008   mild-moderate MR, mild-modTricuspid regurg.     Current Meds  Medication Sig  . aluminum & magnesium hydroxide-simethicone (MYLANTA) 500-450-40 MG/5ML suspension Take by mouth every 6 (six) hours as needed. 200-200-20mg /41ml   . aspirin 81 MG tablet Take 81 mg by mouth daily.    . betamethasone dipropionate (DIPROLENE) 0.05 % ointment daily as needed.  . cholecalciferol (VITAMIN D) 1000 UNITS tablet Take 2,000 Units by mouth daily.   . Coenzyme Q10 (CO Q-10) 200 MG CAPS Take 1 capsule by mouth daily.   . Cyanocobalamin (VITAMIN B-12 IJ) Inject as directed. monthly   . denosumab (PROLIA) 60 MG/ML SOLN injection Inject 60 mg into the skin every 6 (six) months.  Administer in upper arm, thigh, or abdomen  . famotidine (PEPCID) 10 MG tablet Take 10 mg by mouth daily.   . finasteride (PROSCAR) 5 MG tablet Take 1 tablet by mouth 3 (three) times a week. TIW  . fluticasone (FLONASE) 50 MCG/ACT nasal spray Place 1  spray into both nostrils daily as needed for allergies.   . hydroxyurea (HYDREA) 500 MG capsule TAKE 1 CAPSULE BY MOUTH  DAILY MAY TAKE WITH FOOD TO MINIMIZE GI SIDE EFFECTS.  [name] levothyroxine (SYNTHROID, LEVOTHROID) 25 MCG tablet Take 25 mcg by mouth daily before breakfast.  . LUTEIN PO Take 40 mg by mouth once a week.   . metoprolol tartrate (LOPRESSOR) 25 MG tablet Take 1 tablet (25 mg total) by mouth 2 (two) times daily. Take 1 tablet in morning and 1/2 in the evening depending on HR and rhythm  . Multiple Vitamin (MULTIVITAMIN) tablet Take 1 tablet by mouth daily.    . Multiple Vitamins-Minerals (PRESERVISION AREDS PO) Take 1 capsule by mouth daily.   . traMADol (ULTRAM) 50 MG tablet Take 50 mg by mouth every 6 (six) hours as needed for moderate pain.      Allergies:   Pollen extract   Social History   Tobacco Use  . Smoking status: Never Smoker  . Smokeless tobacco: Never Used  Substance Use Topics  . Alcohol use: No  . Drug use: No     Family Hx: The patient's family history includes Allergies in his daughter; Asthma in his grandchild; Coronary artery disease in his maternal grandfather; Diabetes in his father; Heart attack in his maternal grandfather; Heart disease in his maternal grandmother; Heart failure in his father, maternal grandmother, mother, and sister; Other in his father, mother, and paternal grandmother.  ROS:   Please see the history of present illness.    No fevers chills or night sweats No cough He received his Covid vaccination No awareness of anginal symptoms Rare to occasional relatively asymptomatic PVCs No significant edema No bleeding All other systems reviewed and are negative.   Prior CV studies:     The following studies were reviewed today:  ECHO 04/25/2019 IMPRESSIONS  1. Left ventricular ejection fraction, by visual estimation, is 60 to  65%. The left ventricle has normal function. There is no left ventricular  hypertrophy.  2. Left ventricular diastolic parameters are consistent with Grade I  diastolic dysfunction (impaired relaxation).  3. The left ventricle has no regional wall motion abnormalities.  4. Global right ventricle has normal systolic function.The right  ventricular size is normal. No increase in right ventricular wall  thickness.  5. Left atrial size was normal.  6. Right atrial size was normal.  7. The mitral valve is normal in structure. No evidence of mitral valve  regurgitation. No evidence of mitral stenosis.  8. The tricuspid valve is myxomatous. Tricuspid valve regurgitation  mild-moderate.  9. Aortic valve regurgitation is mild to moderate.  10. The aortic valve is tricuspid. Aortic valve regurgitation is mild to  moderate. Mild aortic valve stenosis.  11. There is very mild aortic stenosis by planimetry (1.9 cm sq), but  there was inadequate assessment of aortic valve gradients.  12. The pulmonic valve was grossly normal. Pulmonic valve regurgitation is  not visualized.  13. There is mild dilatation of the aortic root measuring 40 mm.  14. Mildly elevated pulmonary artery systolic pressure.  15. The inferior vena cava is normal in size with greater than 50%  respiratory variability, suggesting right atrial pressure of 3 mmHg.   Labs/Other Tests and Data Reviewed:    EKG: I personally reviewed the ECG from 06/27/2018 at outside bradycardia 56 bpm with ventricular trigeminy. A prior ECG from April 2019 showed sinus bradycardia at 56 bpm without ectopy.  Recent Labs: 02/25/2019: ALT 14;  BUN 34; Creatinine, Ser 1.71; Hemoglobin 12.4; Magnesium 2.2; Platelets 348; Potassium 4.6; Sodium 142; TSH 2.770   Recent Lipid Panel Lab Results   Component Value Date/Time   CHOL 148 02/25/2019 11:58 AM   TRIG 88 02/25/2019 11:58 AM   HDL 48 02/25/2019 11:58 AM   CHOLHDL 3.1 02/25/2019 11:58 AM   CHOLHDL 5.3 (H) 03/20/2016 11:09 AM   LDLCALC 83 02/25/2019 11:58 AM    Wt Readings from Last 3 Encounters:  06/23/19 120 lb (54.4 kg)  02/25/19 120 lb 3.2 oz (54.5 kg)  02/22/19 119 lb (54 kg)     Objective:    Vital Signs:  Wt 120 lb (54.4 kg)   BMI 20.60 kg/m    Since this was a virtual visit I could not physically examine the patient.  However he tells me he had gone to urologist this week.  Blood pressure during that evaluation was 110/65.  Resting pulse has been in the 50s to 60s.  He notes rare extra beats.  He is asymptomatic Breathing is normal and not labored There is no wheezing He is unaware of any chest wall tenderness to palpation no abdominal tenderness to his palpation He denies any leg swelling He has normal affect and mood  ASSESSMENT & PLAN:    1. CAD/CABG revascularization: He is status post CABG surgery in 1996 and has documented graft occlusion of 2 of his grafts with significant native CAD.  He denies any symptoms.  Remotely we attempted Ranexa but he was concerned about tolerability issues.  He is on a reduced dose of metoprolol due to previous documentation of bradycardia currently at 25 mg in the morning and 12.5 mg at night. 2. Asymptomatic PVCs.  He believes his rhythm has improved.  On his last ECG he was noted to have trigeminy.  He has a Zio patch monitor but never placed this on due to uncertainty as to application.  I discussed with him that he can potentially bring this to our office for placement or if he continues to feel well and is asymptomatic can mail the [facility] patch back. 3. Hyperlipidemia: Was remotely on a statin.  LDL cholesterol in October 2000 2083. 4. Thrombocytosis: Currently controlled on hydroxyurea, now followed by Dr. Benay [name].  Persistent macrocytosis. 5. Hypothyroidism: On  low-dose levothyroxine  COVID-19 Education: The signs and symptoms of COVID-19 were discussed with the patient and how to seek care for testing (follow up with PCP or arrange E-visit).  The importance of social distancing was discussed today.  Time:   Today, I have spent 18 minutes with the patient with telehealth technology discussing the above problems.     Medication Adjustments/Labs and Tests Ordered: Current medicines are reviewed at length with the patient today.  Concerns regarding medicines are outlined above.   Tests Ordered: No orders of the defined types were placed in this encounter.   Medication Changes: No orders of the defined types were placed in this encounter.   Follow Up:  In Person in 6 month(s)  Signed, Alexander Majestic, MD  06/23/2019 12:19 PM    [place]

## 2019-06-23 NOTE — Telephone Encounter (Signed)
Able to get into contact with patient about changing to a virtual visit today due to the inclement weather. Pt agreeable to this and appt changed for a virtual visit today.

## 2019-06-23 NOTE — Patient Instructions (Addendum)
Medication Instructions:  CONTINUE WITH CURRENT MEDICATIONS. NO CHANGES.  *If you need a refill on your cardiac medications before your next appointment, please call your pharmacy*  Follow-Up: At [facility], you and your health needs are our priority.  As part of our continuing mission to provide you with exceptional heart care, we have created designated Provider Care Teams.  These Care Teams include your primary Cardiologist (physician) and Advanced Practice Providers (APPs -  Physician Assistants and Nurse Practitioners) who all work together to provide you with the care you need, when you need it.  Your next appointment:   6 MONTHS  The format for your next appointment:   IN OFFICE  Provider:   DR. Shelva Majestic  The zio patch that was sent to you in October, if you would like to keep this until you have any issues and then we can place the patch, Dr. Claiborne Billings is fine with this. If you would rather have it placed sooner we can do this as well.  Please call our office with any questions or concerns  [phone]

## 2019-06-23 NOTE — Telephone Encounter (Addendum)
Called pt to discuss AVS and Dr. Evette Georges orders, pt stated that it was an inconvenient time for this. Told pt I would mail him the AVS with Dr.Kelly's instructions. Pt stated this was best. Notified pt if he had any questions or concerns to call our office. Pt agreeable. Will mail avs

## 2019-06-27 ENCOUNTER — Other Ambulatory Visit: Payer: Self-pay | Admitting: Oncology

## 2019-06-27 ENCOUNTER — Telehealth: Payer: Self-pay | Admitting: *Deleted

## 2019-06-27 NOTE — Telephone Encounter (Signed)
Patient left message needing refill on his Hydrea and also requesting refills be added as well. Per Dr. Benay Spice: Needs to have lab/OV before continuing refills past one. Patient was a no show for his 06/02/19 appointment. Scheduling message sent to reschedule his lab/OV with Dr. Benay Spice. Will refill hydrea x 1 only.

## 2019-06-29 ENCOUNTER — Telehealth: Payer: Self-pay | Admitting: Oncology

## 2019-06-29 NOTE — Telephone Encounter (Signed)
Scheduled 3/9 appt per 2/22 sch msg. Confirmed appt details with patient and sent a reminder letter and calendar.

## 2019-07-12 ENCOUNTER — Inpatient Hospital Stay (HOSPITAL_BASED_OUTPATIENT_CLINIC_OR_DEPARTMENT_OTHER): Payer: Medicare Other | Admitting: Oncology

## 2019-07-12 ENCOUNTER — Inpatient Hospital Stay: Payer: Medicare Other | Attending: Oncology

## 2019-07-12 ENCOUNTER — Other Ambulatory Visit: Payer: Self-pay

## 2019-07-12 VITALS — BP 137/66 | HR 68 | Temp 98.7°F | Resp 18 | Ht 64.0 in | Wt 119.6 lb

## 2019-07-12 DIAGNOSIS — N400 Enlarged prostate without lower urinary tract symptoms: Secondary | ICD-10-CM | POA: Insufficient documentation

## 2019-07-12 DIAGNOSIS — D473 Essential (hemorrhagic) thrombocythemia: Secondary | ICD-10-CM

## 2019-07-12 DIAGNOSIS — H9192 Unspecified hearing loss, left ear: Secondary | ICD-10-CM | POA: Diagnosis not present

## 2019-07-12 DIAGNOSIS — H3530 Unspecified macular degeneration: Secondary | ICD-10-CM | POA: Diagnosis not present

## 2019-07-12 DIAGNOSIS — N189 Chronic kidney disease, unspecified: Secondary | ICD-10-CM | POA: Diagnosis not present

## 2019-07-12 DIAGNOSIS — D649 Anemia, unspecified: Secondary | ICD-10-CM | POA: Insufficient documentation

## 2019-07-12 DIAGNOSIS — I2510 Atherosclerotic heart disease of native coronary artery without angina pectoris: Secondary | ICD-10-CM | POA: Diagnosis not present

## 2019-07-12 DIAGNOSIS — I25810 Atherosclerosis of coronary artery bypass graft(s) without angina pectoris: Secondary | ICD-10-CM

## 2019-07-12 DIAGNOSIS — Z951 Presence of aortocoronary bypass graft: Secondary | ICD-10-CM | POA: Insufficient documentation

## 2019-07-12 LAB — CMP (CANCER CENTER ONLY)
ALT: 11 U/L (ref 0–44)
AST: 21 U/L (ref 15–41)
Albumin: 3.9 g/dL (ref 3.5–5.0)
Alkaline Phosphatase: 52 U/L (ref 38–126)
Anion gap: 7 (ref 5–15)
BUN: 33 mg/dL — ABNORMAL HIGH (ref 8–23)
CO2: 25 mmol/L (ref 22–32)
Calcium: 8.9 mg/dL (ref 8.9–10.3)
Chloride: 110 mmol/L (ref 98–111)
Creatinine: 1.79 mg/dL — ABNORMAL HIGH (ref 0.61–1.24)
GFR, Est AFR Am: 37 mL/min — ABNORMAL LOW (ref >60)
GFR, Estimated: 32 mL/min — ABNORMAL LOW (ref >60)
Glucose, Bld: 98 mg/dL (ref 70–99)
Potassium: 4.7 mmol/L (ref 3.5–5.1)
Sodium: 142 mmol/L (ref 135–145)
Total Bilirubin: 0.5 mg/dL (ref 0.3–1.2)
Total Protein: 7 g/dL (ref 6.5–8.1)

## 2019-07-12 LAB — CBC WITH DIFFERENTIAL (CANCER CENTER ONLY)
Abs Immature Granulocytes: 0.07 10*3/uL (ref 0.00–0.07)
Basophils Absolute: 0.1 10*3/uL (ref 0.0–0.1)
Basophils Relative: 2 %
Eosinophils Absolute: 0.2 10*3/uL (ref 0.0–0.5)
Eosinophils Relative: 2 %
HCT: 37.8 % — ABNORMAL LOW (ref 39.0–52.0)
Hemoglobin: 12.2 g/dL — ABNORMAL LOW (ref 13.0–17.0)
Immature Granulocytes: 1 %
Lymphocytes Relative: 23 %
Lymphs Abs: 1.6 10*3/uL (ref 0.7–4.0)
MCH: 39.1 pg — ABNORMAL HIGH (ref 26.0–34.0)
MCHC: 32.3 g/dL (ref 30.0–36.0)
MCV: 121.2 fL — ABNORMAL HIGH (ref 80.0–100.0)
Monocytes Absolute: 0.9 10*3/uL (ref 0.1–1.0)
Monocytes Relative: 12 %
Neutro Abs: 4.2 10*3/uL (ref 1.7–7.7)
Neutrophils Relative %: 60 %
Platelet Count: 439 10*3/uL — ABNORMAL HIGH (ref 150–400)
RBC: 3.12 MIL/uL — ABNORMAL LOW (ref 4.22–5.81)
RDW: 13.5 % (ref 11.5–15.5)
WBC Count: 7 10*3/uL (ref 4.0–10.5)
nRBC: 0 % (ref 0.0–0.2)

## 2019-07-12 NOTE — Progress Notes (Signed)
  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  He feels well.  No bleeding or symptom of thrombosis.  He continues hydroxyurea daily.  He has received both doses of the COVID-19 vaccine.  He takes Prolia every 6 months.  Objective:  Vital signs in last 24 hours:  Blood pressure 137/66, pulse 68, temperature 98.7 F (37.1 C), temperature source Temporal, resp. rate 18, height 5\' 4"  (1.626 m), weight 119 lb 9.6 oz (54.3 kg), SpO2 98 %.   Limited physical examination secondary to distancing with the Covid pandemic HEENT: No thrush or ulcers GI: No hepatosplenomegaly, nontender Vascular: No leg edema   Lab Results:  Lab Results  Component Value Date   WBC 7.0 07/12/2019   HGB 12.2 (L) 07/12/2019   HCT 37.8 (L) 07/12/2019   MCV 121.2 (H) 07/12/2019   PLT 439 (H) 07/12/2019   NEUTROABS 4.2 07/12/2019    CMP  Lab Results  Component Value Date   NA 142 02/25/2019   K 4.6 02/25/2019   CL 107 (H) 02/25/2019   CO2 22 02/25/2019   GLUCOSE 95 02/25/2019   BUN 34 02/25/2019   CREATININE 1.71 (H) 02/25/2019   CALCIUM 10.0 02/25/2019   PROT 7.2 02/25/2019   ALBUMIN 4.3 02/25/2019   AST 21 02/25/2019   ALT 14 02/25/2019   ALKPHOS 64 02/25/2019   BILITOT 0.5 02/25/2019   GFRNONAA 34 (L) 02/25/2019   GFRAA 39 (L) 02/25/2019    Medications: I have reviewed the patient's current medications.   Assessment/Plan: 1. Essential thrombocytosis, JAK2 (V617F)-positive  Hydroxyurea started in 2015 2. Chronic renal failure 3. Left-sided hearing loss 4. Coronary artery disease, status post coronary artery bypass surgery in 1996 5. Macular degeneration 6. BPH 7. Anemia   Disposition: [name] appears stable.  The platelet count remains in goal range.  He will continue hydroxyurea at the current dose.  He will return for an office and lab visit in 6 months. The mild anemia is  chronic and stable.  The anemia is potentially related to the myeloproliferative disorder, hydroxyurea therapy, or chronic renal insufficiency.  Betsy Coder, MD  07/12/2019  10:53 AM

## 2019-07-21 ENCOUNTER — Telehealth: Payer: Self-pay

## 2019-07-21 DIAGNOSIS — Z961 Presence of intraocular lens: Secondary | ICD-10-CM | POA: Diagnosis not present

## 2019-07-21 DIAGNOSIS — H04129 Dry eye syndrome of unspecified lacrimal gland: Secondary | ICD-10-CM | POA: Diagnosis not present

## 2019-07-21 DIAGNOSIS — H353231 Exudative age-related macular degeneration, bilateral, with active choroidal neovascularization: Secondary | ICD-10-CM | POA: Diagnosis not present

## 2019-07-21 NOTE — Telephone Encounter (Signed)
Please see below.

## 2019-07-21 NOTE — Telephone Encounter (Signed)
Patient left voicemail on triage phone asking for Dr. Durward Fortes to call him back asap, he stated he wanted to speak to him personally or Aaron Edelman.

## 2019-07-21 NOTE — Telephone Encounter (Signed)
thanks

## 2019-07-26 ENCOUNTER — Encounter: Payer: Self-pay | Admitting: *Deleted

## 2019-07-26 NOTE — Progress Notes (Signed)
.  Patient ID: Alexander [name], Dr., male   DOB: 1922/01/24, 86 y.o.   MRN: [id number]  ZIO patch monitor has not been returned to [employer] to be processed.   [employer] has attempted to contact patient three times to request return of monitor

## 2019-08-04 DIAGNOSIS — H353231 Exudative age-related macular degeneration, bilateral, with active choroidal neovascularization: Secondary | ICD-10-CM | POA: Diagnosis not present

## 2019-08-04 DIAGNOSIS — H04129 Dry eye syndrome of unspecified lacrimal gland: Secondary | ICD-10-CM | POA: Diagnosis not present

## 2019-08-04 DIAGNOSIS — Z961 Presence of intraocular lens: Secondary | ICD-10-CM | POA: Diagnosis not present

## 2019-08-09 DIAGNOSIS — L853 Xerosis cutis: Secondary | ICD-10-CM | POA: Diagnosis not present

## 2019-08-09 DIAGNOSIS — L821 Other seborrheic keratosis: Secondary | ICD-10-CM | POA: Diagnosis not present

## 2019-08-11 DIAGNOSIS — H04123 Dry eye syndrome of bilateral lacrimal glands: Secondary | ICD-10-CM | POA: Diagnosis not present

## 2019-08-11 DIAGNOSIS — Z961 Presence of intraocular lens: Secondary | ICD-10-CM | POA: Diagnosis not present

## 2019-08-11 DIAGNOSIS — H353231 Exudative age-related macular degeneration, bilateral, with active choroidal neovascularization: Secondary | ICD-10-CM | POA: Diagnosis not present

## 2019-09-05 DIAGNOSIS — Z1331 Encounter for screening for depression: Secondary | ICD-10-CM | POA: Diagnosis not present

## 2019-09-05 DIAGNOSIS — E038 Other specified hypothyroidism: Secondary | ICD-10-CM | POA: Diagnosis not present

## 2019-09-05 DIAGNOSIS — M810 Age-related osteoporosis without current pathological fracture: Secondary | ICD-10-CM | POA: Diagnosis not present

## 2019-09-05 DIAGNOSIS — D519 Vitamin B12 deficiency anemia, unspecified: Secondary | ICD-10-CM | POA: Diagnosis not present

## 2019-09-05 DIAGNOSIS — R413 Other amnesia: Secondary | ICD-10-CM | POA: Diagnosis not present

## 2019-09-05 DIAGNOSIS — M542 Cervicalgia: Secondary | ICD-10-CM | POA: Diagnosis not present

## 2019-10-06 ENCOUNTER — Emergency Department (HOSPITAL_COMMUNITY)
Admission: EM | Admit: 2019-10-06 | Discharge: 2019-10-06 | Disposition: A | Discharge status: Home / Self Care | Payer: Medicare Other | Attending: Emergency Medicine | Admitting: Emergency Medicine

## 2019-10-06 ENCOUNTER — Other Ambulatory Visit: Payer: Self-pay

## 2019-10-06 ENCOUNTER — Emergency Department (HOSPITAL_COMMUNITY): Payer: Medicare Other

## 2019-10-06 ENCOUNTER — Encounter (HOSPITAL_COMMUNITY): Payer: Self-pay

## 2019-10-06 DIAGNOSIS — S00212A Abrasion of left eyelid and periocular area, initial encounter: Secondary | ICD-10-CM | POA: Insufficient documentation

## 2019-10-06 DIAGNOSIS — N183 Chronic kidney disease, stage 3 unspecified: Secondary | ICD-10-CM | POA: Insufficient documentation

## 2019-10-06 DIAGNOSIS — Z79899 Other long term (current) drug therapy: Secondary | ICD-10-CM | POA: Diagnosis not present

## 2019-10-06 DIAGNOSIS — I129 Hypertensive chronic kidney disease with stage 1 through stage 4 chronic kidney disease, or unspecified chronic kidney disease: Secondary | ICD-10-CM | POA: Insufficient documentation

## 2019-10-06 DIAGNOSIS — R0789 Other chest pain: Secondary | ICD-10-CM | POA: Diagnosis not present

## 2019-10-06 DIAGNOSIS — Y9389 Activity, other specified: Secondary | ICD-10-CM | POA: Diagnosis not present

## 2019-10-06 DIAGNOSIS — Y999 Unspecified external cause status: Secondary | ICD-10-CM | POA: Insufficient documentation

## 2019-10-06 DIAGNOSIS — Z7982 Long term (current) use of aspirin: Secondary | ICD-10-CM | POA: Diagnosis not present

## 2019-10-06 DIAGNOSIS — Y929 Unspecified place or not applicable: Secondary | ICD-10-CM | POA: Diagnosis not present

## 2019-10-06 DIAGNOSIS — R0781 Pleurodynia: Secondary | ICD-10-CM | POA: Diagnosis not present

## 2019-10-06 DIAGNOSIS — R079 Chest pain, unspecified: Secondary | ICD-10-CM | POA: Diagnosis not present

## 2019-10-06 MED ORDER — ACETAMINOPHEN 325 MG PO TABS
650.0000 mg | ORAL_TABLET | Freq: Once | ORAL | Status: AC
  Administered 2019-10-06: 650 mg via ORAL
  Filled 2019-10-06: qty 2
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
## 2019-10-18 ENCOUNTER — Ambulatory Visit (HOSPITAL_COMMUNITY): Payer: Medicare Other

## 2019-10-19 ENCOUNTER — Encounter (HOSPITAL_COMMUNITY): Payer: Self-pay

## 2019-10-19 ENCOUNTER — Ambulatory Visit (HOSPITAL_COMMUNITY)
Admission: RE | Admit: 2019-10-19 | Discharge: 2019-10-19 | Disposition: A | Discharge status: Home / Self Care | Payer: Medicare Other | Source: Ambulatory Visit | Attending: Internal Medicine | Admitting: Internal Medicine

## 2019-10-19 ENCOUNTER — Other Ambulatory Visit: Payer: Self-pay

## 2019-10-19 DIAGNOSIS — M810 Age-related osteoporosis without current pathological fracture: Secondary | ICD-10-CM | POA: Insufficient documentation

## 2019-10-19 MED ORDER — DENOSUMAB 60 MG/ML ~~LOC~~ SOSY: 60.0000 mg | PREFILLED_SYRINGE | Freq: Once | SUBCUTANEOUS | Status: AC

## 2019-10-19 MED ORDER — DENOSUMAB 60 MG/ML ~~LOC~~ SOSY
PREFILLED_SYRINGE | SUBCUTANEOUS | Status: AC
  Administered 2019-10-19: 60 mg via SUBCUTANEOUS
  Filled 2019-10-19: qty 1

## 2019-10-19 NOTE — Discharge Instructions (Signed)
Your next appointment will be due in December 2021.  Please call your doctors office in November, 2021 to get your next Prolia appointment scheduled.  [facility] will no be doing these type of appointments after November 14, 2019.    Denosumab injection What is this medicine? DENOSUMAB (den oh sue mab) slows bone breakdown. Prolia is used to treat osteoporosis in women after menopause and in men, and in people who are taking corticosteroids for 6 months or more. Delton See is used to treat a high calcium level due to cancer and to prevent bone fractures and other bone problems caused by multiple myeloma or cancer bone metastases. Delton See is also used to treat giant cell tumor of the bone. This medicine may be used for other purposes; ask your health care provider or pharmacist if you have questions. COMMON BRAND NAME(S): Prolia, XGEVA What should I tell my health care provider before I take this medicine? They need to know if you have any of these conditions:  dental disease  having surgery or tooth extraction  infection  kidney disease  low levels of calcium or Vitamin D in the blood  malnutrition  on hemodialysis  skin conditions or sensitivity  thyroid or parathyroid disease  an unusual reaction to denosumab, other medicines, foods, dyes, or preservatives  pregnant or trying to get pregnant  breast-feeding How should I use this medicine? This medicine is for injection under the skin. It is given by a health care professional in a hospital or clinic setting. A special MedGuide will be given to you before each treatment. Be sure to read this information carefully each time. For Prolia, talk to your pediatrician regarding the use of this medicine in children. Special care may be needed. For Delton See, talk to your pediatrician regarding the use of this medicine in children. While this drug may be prescribed for children as young as 13 years for selected conditions, precautions do  apply. Overdosage: If you think you have taken too much of this medicine contact a poison control center or emergency room at once. NOTE: This medicine is only for you. Do not share this medicine with others. What if I miss a dose? It is important not to miss your dose. Call your doctor or health care professional if you are unable to keep an appointment. What may interact with this medicine? Do not take this medicine with any of the following medications:  other medicines containing denosumab This medicine may also interact with the following medications:  medicines that lower your chance of fighting infection  steroid medicines like prednisone or cortisone This list may not describe all possible interactions. Give your health care provider a list of all the medicines, herbs, non-prescription drugs, or dietary supplements you use. Also tell them if you smoke, drink alcohol, or use illegal drugs. Some items may interact with your medicine. What should I watch for while using this medicine? Visit your doctor or health care professional for regular checks on your progress. Your doctor or health care professional may order blood tests and other tests to see how you are doing. Call your doctor or health care professional for advice if you get a fever, chills or sore throat, or other symptoms of a cold or flu. Do not treat yourself. This drug may decrease your body's ability to fight infection. Try to avoid being around people who are sick. You should make sure you get enough calcium and vitamin D while you are taking this medicine, unless your  doctor tells you not to. Discuss the foods you eat and the vitamins you take with your health care professional. See your dentist regularly. Brush and floss your teeth as directed. Before you have any dental work done, tell your dentist you are receiving this medicine. Do not become pregnant while taking this medicine or for 5 months after stopping it. Talk  with your doctor or health care professional about your birth control options while taking this medicine. Women should inform their doctor if they wish to become pregnant or think they might be pregnant. There is a potential for serious side effects to an unborn child. Talk to your health care professional or pharmacist for more information. What side effects may I notice from receiving this medicine? Side effects that you should report to your doctor or health care professional as soon as possible:  allergic reactions like skin rash, itching or hives, swelling of the face, lips, or tongue  bone pain  breathing problems  dizziness  jaw pain, especially after dental work  redness, blistering, peeling of the skin  signs and symptoms of infection like fever or chills; cough; sore throat; pain or trouble passing urine  signs of low calcium like fast heartbeat, muscle cramps or muscle pain; pain, tingling, numbness in the hands or feet; seizures  unusual bleeding or bruising  unusually weak or tired Side effects that usually do not require medical attention (report to your doctor or health care professional if they continue or are bothersome):  constipation  diarrhea  headache  joint pain  loss of appetite  muscle pain  runny nose  tiredness  upset stomach This list may not describe all possible side effects. Call your doctor for medical advice about side effects. You may report side effects to FDA at 1-800-FDA-1088. Where should I keep my medicine? This medicine is only given in a clinic, doctor's office, or other health care setting and will not be stored at home. NOTE: This sheet is a summary. It may not cover all possible information. If you have questions about this medicine, talk to your doctor, pharmacist, or health care provider.  2020 Elsevier/Gold Standard (2017-08-28 16:10:44)

## 2019-10-19 NOTE — Progress Notes (Signed)
Prolia 60 mg sq given to the left upper arm.  Patient informed next injection will be due in December 2021.  Patient was told to call Dr. Silvestre Mesi office in November to schedule his lab work for the [facility] and to get his next injection scheduled.   Patient was informed that after November 14, 2019 [facility] will no longer be doing Prolia injections.  Patient voiced understanding to all teaching.

## 2019-10-28 ENCOUNTER — Other Ambulatory Visit: Payer: Self-pay | Admitting: Oncology

## 2019-10-28 ENCOUNTER — Other Ambulatory Visit: Payer: Self-pay | Admitting: *Deleted

## 2019-11-17 DIAGNOSIS — H04123 Dry eye syndrome of bilateral lacrimal glands: Secondary | ICD-10-CM | POA: Diagnosis not present

## 2019-11-17 DIAGNOSIS — H353231 Exudative age-related macular degeneration, bilateral, with active choroidal neovascularization: Secondary | ICD-10-CM | POA: Diagnosis not present

## 2019-11-17 DIAGNOSIS — Z961 Presence of intraocular lens: Secondary | ICD-10-CM | POA: Diagnosis not present

## 2019-11-17 DIAGNOSIS — Z9841 Cataract extraction status, right eye: Secondary | ICD-10-CM | POA: Diagnosis not present

## 2019-11-18 ENCOUNTER — Other Ambulatory Visit (HOSPITAL_COMMUNITY): Payer: Self-pay

## 2019-11-21 ENCOUNTER — Encounter (HOSPITAL_COMMUNITY): Payer: Medicare Other

## 2019-11-21 DIAGNOSIS — M542 Cervicalgia: Secondary | ICD-10-CM | POA: Diagnosis not present

## 2019-12-01 DIAGNOSIS — D519 Vitamin B12 deficiency anemia, unspecified: Secondary | ICD-10-CM | POA: Diagnosis not present

## 2019-12-05 DIAGNOSIS — M542 Cervicalgia: Secondary | ICD-10-CM | POA: Diagnosis not present

## 2019-12-06 ENCOUNTER — Telehealth: Payer: Self-pay | Admitting: Cardiovascular Disease

## 2019-12-06 DIAGNOSIS — Z951 Presence of aortocoronary bypass graft: Secondary | ICD-10-CM

## 2019-12-06 DIAGNOSIS — N183 Chronic kidney disease, stage 3 unspecified: Secondary | ICD-10-CM

## 2019-12-06 DIAGNOSIS — R002 Palpitations: Secondary | ICD-10-CM

## 2019-12-06 DIAGNOSIS — I25810 Atherosclerosis of coronary artery bypass graft(s) without angina pectoris: Secondary | ICD-10-CM

## 2019-12-06 DIAGNOSIS — E039 Hypothyroidism, unspecified: Secondary | ICD-10-CM

## 2019-12-06 DIAGNOSIS — I493 Ventricular premature depolarization: Secondary | ICD-10-CM

## 2019-12-06 NOTE — Telephone Encounter (Signed)
Patient called to schedule his follow up appointment with Dr. Claiborne Billings (due 12/2019). I informed patient that Dr. Claiborne Billings does not have any availability until 04/02/20. He stated he will be going out of town in the next few weeks and he would like to be seen prior to traveling. I offered to schedule patient with an APP however, he declined and stated he will be contacting Dr. Claiborne Billings personally to make him aware that he would like to be worked into his schedule. He requested that I route a message to Dr. Claiborne Billings to make him aware that he will be contacting him shortly.

## 2019-12-12 NOTE — Telephone Encounter (Signed)
Try to find out when the patient is going out of town.  I will try to work him in in some way prior to his departure

## 2019-12-12 NOTE — Telephone Encounter (Signed)
Called and spoke with pt, he states he is moving sometime in September to [place] to live with his daughter. He reports he has not had any trouble or angina or real SOB. He states Dr.Kelly had wanted a holter monitor at one point but there were no instructions on the monitor so the pt sent it back and never had the monitor done. He would like to be worked into [facility] schedule. Able to schedule pt on 12/28/19 at 11:20am.  Pt states he needs a Report from Dr.Kelly's office sent to his new provider and he will provide that information at the time of his appt. He would also like to know if Dr.Kelly would like any lab work including a PLT count prior to his visit. Notified I would send this message to Dr.Kelly to review and advise on if lab work is needed. Pt verbalized understanding with no other questions at this time.

## 2019-12-13 NOTE — Telephone Encounter (Signed)
If patient has not had recent labs will check laboratory prior to his office visit

## 2019-12-14 NOTE — Telephone Encounter (Signed)
Called and spoke with pt, notified that Dr.Kelly would like labs drawn prior to his office visit. CBC, CMET, TSH, LIPID, and MAG. Pt states not to forget his platelets and kidney function.  Notified that PLTs are checked in the CBC and that kidney function is shown in the CMET. Pt verbalized understanding. Notified he could come the Friday prior to his appt or that Monday to our office to have his labs drawn. Pt states he would like a phone call to remind him prior. Notified this was fine and I would make a note to call him back. Pt thankful for the call and had no other questions at this time.

## 2019-12-22 NOTE — Telephone Encounter (Signed)
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 He would like to remind Dr.Kelly that he needs a summary of all of his important health information for when he moves in a few months. Notified I would make sure Dr.Kelly got this message. Pt verbalized understanding and stated he would come tomorrow fasting for his labs. No other questions at this time.

## 2019-12-23 DIAGNOSIS — I25810 Atherosclerosis of coronary artery bypass graft(s) without angina pectoris: Secondary | ICD-10-CM | POA: Diagnosis not present

## 2019-12-23 DIAGNOSIS — I493 Ventricular premature depolarization: Secondary | ICD-10-CM | POA: Diagnosis not present

## 2019-12-23 DIAGNOSIS — R002 Palpitations: Secondary | ICD-10-CM | POA: Diagnosis not present

## 2019-12-23 DIAGNOSIS — Z951 Presence of aortocoronary bypass graft: Secondary | ICD-10-CM | POA: Diagnosis not present

## 2019-12-23 DIAGNOSIS — N183 Chronic kidney disease, stage 3 unspecified: Secondary | ICD-10-CM | POA: Diagnosis not present

## 2019-12-23 DIAGNOSIS — E039 Hypothyroidism, unspecified: Secondary | ICD-10-CM | POA: Diagnosis not present

## 2019-12-23 LAB — LIPID PANEL
Chol/HDL Ratio: 4.3 ratio (ref 0.0–5.0)
Cholesterol, Total: 166 mg/dL (ref 100–199)
HDL: 39 mg/dL — ABNORMAL LOW (ref >39)
LDL Chol Calc (NIH): 102 mg/dL — ABNORMAL HIGH (ref 0–99)
Triglycerides: 138 mg/dL (ref 0–149)
VLDL Cholesterol Cal: 25 mg/dL (ref 5–40)

## 2019-12-23 LAB — COMPREHENSIVE METABOLIC PANEL
ALT: 11 IU/L (ref 0–44)
AST: 18 IU/L (ref 0–40)
Albumin/Globulin Ratio: 1.4 (ref 1.2–2.2)
Albumin: 4 g/dL (ref 3.5–4.6)
Alkaline Phosphatase: 60 IU/L (ref 48–121)
BUN/Creatinine Ratio: 19 (ref 10–24)
BUN: 34 mg/dL (ref 10–36)
Bilirubin Total: 0.4 mg/dL (ref 0.0–1.2)
CO2: 20 mmol/L (ref 20–29)
Calcium: 9.4 mg/dL (ref 8.6–10.2)
Chloride: 109 mmol/L — ABNORMAL HIGH (ref 96–106)
Creatinine, Ser: 1.83 mg/dL — ABNORMAL HIGH (ref 0.76–1.27)
GFR calc Af Amer: 35 mL/min/{1.73_m2} — ABNORMAL LOW (ref >59)
GFR calc non Af Amer: 31 mL/min/{1.73_m2} — ABNORMAL LOW (ref >59)
Globulin, Total: 2.8 g/dL (ref 1.5–4.5)
Glucose: 96 mg/dL (ref 65–99)
Potassium: 5 mmol/L (ref 3.5–5.2)
Sodium: 141 mmol/L (ref 134–144)
Total Protein: 6.8 g/dL (ref 6.0–8.5)

## 2019-12-23 LAB — MAGNESIUM: Magnesium: 2.2 mg/dL (ref 1.6–2.3)

## 2019-12-23 LAB — CBC
Hematocrit: 33.4 % — ABNORMAL LOW (ref 37.5–51.0)
Hemoglobin: 11.8 g/dL — ABNORMAL LOW (ref 13.0–17.7)
MCH: 37.8 pg — ABNORMAL HIGH (ref 26.6–33.0)
MCHC: 35.3 g/dL (ref 31.5–35.7)
MCV: 107 fL — ABNORMAL HIGH (ref 79–97)
Platelets: 589 10*3/uL — ABNORMAL HIGH (ref 150–450)
RBC: 3.12 x10E6/uL — ABNORMAL LOW (ref 4.14–5.80)
RDW: 13 % (ref 11.6–15.4)
WBC: 7.9 10*3/uL (ref 3.4–10.8)

## 2019-12-23 LAB — TSH: TSH: 3.61 u[IU]/mL (ref 0.450–4.500)

## 2019-12-28 ENCOUNTER — Ambulatory Visit: Payer: Medicare Other | Admitting: Cardiovascular Disease

## 2020-01-03 ENCOUNTER — Telehealth: Payer: Self-pay | Admitting: Cardiovascular Disease

## 2020-01-03 NOTE — Telephone Encounter (Signed)
Noted. Will make Dr.Kelly aware.

## 2020-01-03 NOTE — Telephone Encounter (Signed)
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 He wanted to thank Dr Claiborne Billings and the Staff for everything.

## 2020-01-04 NOTE — Telephone Encounter (Signed)
Acknowledged.

## 2020-01-11 ENCOUNTER — Telehealth: Payer: Self-pay | Admitting: *Deleted

## 2020-01-11 NOTE — Telephone Encounter (Signed)
Called to report he has moved to [place] and will need records sent to Dr. Orlene Och. Says their office will be sending a ROI to get records.

## 2020-01-12 ENCOUNTER — Inpatient Hospital Stay: Payer: Medicare Other

## 2020-01-15 DIAGNOSIS — I447 Left bundle-branch block, unspecified: Secondary | ICD-10-CM | POA: Diagnosis not present

## 2020-01-15 DIAGNOSIS — Z5321 Procedure and treatment not carried out due to patient leaving prior to being seen by health care provider: Secondary | ICD-10-CM | POA: Diagnosis not present

## 2020-01-15 DIAGNOSIS — I517 Cardiomegaly: Secondary | ICD-10-CM | POA: Diagnosis not present

## 2020-01-17 ENCOUNTER — Telehealth: Payer: Self-pay | Admitting: Cardiovascular Disease

## 2020-01-17 NOTE — Telephone Encounter (Signed)
Levan is calling is stating he is needing his medical records sent to his new Doctor in [place], Dr. Luevenia Maxin. He was not sure of the fax number, but their office number is [phone].

## 2020-01-23 ENCOUNTER — Telehealth: Payer: Self-pay | Admitting: *Deleted

## 2020-01-23 NOTE — Telephone Encounter (Signed)
CBC checked by Dr. Evette Georges office on 12/23/19 and platelets were 589. Currently on Hydrea 500 mg daily. Asking what are instructions?  Also reports he has just moved to [place], [place].

## 2020-01-24 NOTE — Telephone Encounter (Signed)
Per Dr. Benay Spice: Continue same [facility] and needs OV/repeat labs in 2 months. Patient notified. He is seeing a new PCP tomorrow and will get a referral to new hematologist in the area and let office know who so records can be forwarded there.

## 2020-01-25 DIAGNOSIS — G4700 Insomnia, unspecified: Secondary | ICD-10-CM | POA: Diagnosis not present

## 2020-01-25 DIAGNOSIS — D696 Thrombocytopenia, unspecified: Secondary | ICD-10-CM | POA: Diagnosis not present

## 2020-01-25 DIAGNOSIS — I499 Cardiac arrhythmia, unspecified: Secondary | ICD-10-CM | POA: Diagnosis not present

## 2020-01-25 DIAGNOSIS — N184 Chronic kidney disease, stage 4 (severe): Secondary | ICD-10-CM | POA: Diagnosis not present

## 2020-01-25 DIAGNOSIS — R42 Dizziness and giddiness: Secondary | ICD-10-CM | POA: Diagnosis not present

## 2020-01-25 DIAGNOSIS — R5381 Other malaise: Secondary | ICD-10-CM | POA: Diagnosis not present

## 2020-01-25 DIAGNOSIS — H3530 Unspecified macular degeneration: Secondary | ICD-10-CM | POA: Diagnosis not present

## 2020-01-25 DIAGNOSIS — R0609 Other forms of dyspnea: Secondary | ICD-10-CM | POA: Diagnosis not present

## 2020-01-30 DIAGNOSIS — K449 Diaphragmatic hernia without obstruction or gangrene: Secondary | ICD-10-CM | POA: Diagnosis not present

## 2020-01-30 DIAGNOSIS — J9811 Atelectasis: Secondary | ICD-10-CM | POA: Diagnosis not present

## 2020-01-30 DIAGNOSIS — R0602 Shortness of breath: Secondary | ICD-10-CM | POA: Diagnosis not present

## 2020-01-31 DIAGNOSIS — R42 Dizziness and giddiness: Secondary | ICD-10-CM | POA: Diagnosis not present

## 2020-02-07 DIAGNOSIS — Z23 Encounter for immunization: Secondary | ICD-10-CM | POA: Diagnosis not present

## 2020-02-10 ENCOUNTER — Telehealth: Payer: Self-pay | Admitting: Oncology

## 2020-02-10 DIAGNOSIS — I519 Heart disease, unspecified: Secondary | ICD-10-CM | POA: Diagnosis not present

## 2020-02-10 DIAGNOSIS — M542 Cervicalgia: Secondary | ICD-10-CM | POA: Diagnosis not present

## 2020-02-10 DIAGNOSIS — N182 Chronic kidney disease, stage 2 (mild): Secondary | ICD-10-CM | POA: Diagnosis not present

## 2020-02-10 DIAGNOSIS — D473 Essential (hemorrhagic) thrombocythemia: Secondary | ICD-10-CM | POA: Diagnosis not present

## 2020-02-10 DIAGNOSIS — Z961 Presence of intraocular lens: Secondary | ICD-10-CM | POA: Diagnosis not present

## 2020-02-10 DIAGNOSIS — H353231 Exudative age-related macular degeneration, bilateral, with active choroidal neovascularization: Secondary | ICD-10-CM | POA: Diagnosis not present

## 2020-02-10 NOTE — Telephone Encounter (Signed)
Release: [id number] Faxed medical records to [facility] @ fax#[phone]

## 2020-02-15 DIAGNOSIS — Z7902 Long term (current) use of antithrombotics/antiplatelets: Secondary | ICD-10-CM | POA: Diagnosis not present

## 2020-02-15 DIAGNOSIS — Z79899 Other long term (current) drug therapy: Secondary | ICD-10-CM | POA: Diagnosis not present

## 2020-02-15 DIAGNOSIS — D471 Chronic myeloproliferative disease: Secondary | ICD-10-CM | POA: Diagnosis not present

## 2020-02-16 DIAGNOSIS — I2510 Atherosclerotic heart disease of native coronary artery without angina pectoris: Secondary | ICD-10-CM | POA: Diagnosis not present

## 2020-02-16 DIAGNOSIS — N184 Chronic kidney disease, stage 4 (severe): Secondary | ICD-10-CM | POA: Diagnosis not present

## 2020-02-16 DIAGNOSIS — D471 Chronic myeloproliferative disease: Secondary | ICD-10-CM | POA: Diagnosis not present

## 2020-02-17 DIAGNOSIS — H353211 Exudative age-related macular degeneration, right eye, with active choroidal neovascularization: Secondary | ICD-10-CM | POA: Diagnosis not present

## 2020-02-17 DIAGNOSIS — H353221 Exudative age-related macular degeneration, left eye, with active choroidal neovascularization: Secondary | ICD-10-CM | POA: Diagnosis not present

## 2020-02-23 DIAGNOSIS — D649 Anemia, unspecified: Secondary | ICD-10-CM | POA: Diagnosis not present

## 2020-02-24 DIAGNOSIS — D471 Chronic myeloproliferative disease: Secondary | ICD-10-CM | POA: Diagnosis not present

## 2020-03-05 DIAGNOSIS — I10 Essential (primary) hypertension: Secondary | ICD-10-CM | POA: Diagnosis not present

## 2020-03-05 DIAGNOSIS — Z23 Encounter for immunization: Secondary | ICD-10-CM | POA: Diagnosis not present

## 2020-03-07 DIAGNOSIS — H353232 Exudative age-related macular degeneration, bilateral, with inactive choroidal neovascularization: Secondary | ICD-10-CM | POA: Diagnosis not present

## 2020-03-07 DIAGNOSIS — Z961 Presence of intraocular lens: Secondary | ICD-10-CM | POA: Diagnosis not present

## 2020-03-07 DIAGNOSIS — H04123 Dry eye syndrome of bilateral lacrimal glands: Secondary | ICD-10-CM | POA: Diagnosis not present

## 2020-03-13 DIAGNOSIS — D471 Chronic myeloproliferative disease: Secondary | ICD-10-CM | POA: Diagnosis not present

## 2020-03-13 DIAGNOSIS — R634 Abnormal weight loss: Secondary | ICD-10-CM | POA: Diagnosis not present

## 2020-03-13 DIAGNOSIS — Z7902 Long term (current) use of antithrombotics/antiplatelets: Secondary | ICD-10-CM | POA: Diagnosis not present

## 2020-03-26 DIAGNOSIS — D510 Vitamin B12 deficiency anemia due to intrinsic factor deficiency: Secondary | ICD-10-CM | POA: Diagnosis not present

## 2020-04-02 ENCOUNTER — Other Ambulatory Visit: Payer: Self-pay | Admitting: Cardiovascular Disease

## 2020-04-03 DIAGNOSIS — H353211 Exudative age-related macular degeneration, right eye, with active choroidal neovascularization: Secondary | ICD-10-CM | POA: Diagnosis not present

## 2020-04-03 DIAGNOSIS — H353231 Exudative age-related macular degeneration, bilateral, with active choroidal neovascularization: Secondary | ICD-10-CM | POA: Diagnosis not present

## 2020-04-03 DIAGNOSIS — H353221 Exudative age-related macular degeneration, left eye, with active choroidal neovascularization: Secondary | ICD-10-CM | POA: Diagnosis not present

## 2020-04-04 DIAGNOSIS — I493 Ventricular premature depolarization: Secondary | ICD-10-CM | POA: Diagnosis not present

## 2020-04-04 DIAGNOSIS — K219 Gastro-esophageal reflux disease without esophagitis: Secondary | ICD-10-CM | POA: Diagnosis not present

## 2020-04-04 DIAGNOSIS — E039 Hypothyroidism, unspecified: Secondary | ICD-10-CM | POA: Diagnosis not present

## 2020-04-04 DIAGNOSIS — N1831 Chronic kidney disease, stage 3a: Secondary | ICD-10-CM | POA: Diagnosis not present

## 2020-04-04 DIAGNOSIS — R9431 Abnormal electrocardiogram [ECG] [EKG]: Secondary | ICD-10-CM | POA: Diagnosis not present

## 2020-04-04 DIAGNOSIS — N200 Calculus of kidney: Secondary | ICD-10-CM | POA: Diagnosis not present

## 2020-04-04 DIAGNOSIS — I25119 Atherosclerotic heart disease of native coronary artery with unspecified angina pectoris: Secondary | ICD-10-CM | POA: Diagnosis not present

## 2020-04-04 DIAGNOSIS — I2510 Atherosclerotic heart disease of native coronary artery without angina pectoris: Secondary | ICD-10-CM | POA: Diagnosis not present

## 2020-04-04 DIAGNOSIS — M48061 Spinal stenosis, lumbar region without neurogenic claudication: Secondary | ICD-10-CM | POA: Diagnosis not present

## 2020-04-04 DIAGNOSIS — H3530 Unspecified macular degeneration: Secondary | ICD-10-CM | POA: Diagnosis not present

## 2020-04-04 DIAGNOSIS — I129 Hypertensive chronic kidney disease with stage 1 through stage 4 chronic kidney disease, or unspecified chronic kidney disease: Secondary | ICD-10-CM | POA: Diagnosis not present

## 2020-04-04 DIAGNOSIS — N400 Enlarged prostate without lower urinary tract symptoms: Secondary | ICD-10-CM | POA: Diagnosis not present

## 2020-04-04 DIAGNOSIS — Z01810 Encounter for preprocedural cardiovascular examination: Secondary | ICD-10-CM | POA: Diagnosis not present

## 2020-04-04 DIAGNOSIS — K449 Diaphragmatic hernia without obstruction or gangrene: Secondary | ICD-10-CM | POA: Diagnosis not present

## 2020-04-04 DIAGNOSIS — Z01812 Encounter for preprocedural laboratory examination: Secondary | ICD-10-CM | POA: Diagnosis not present

## 2020-04-04 DIAGNOSIS — K5790 Diverticulosis of intestine, part unspecified, without perforation or abscess without bleeding: Secondary | ICD-10-CM | POA: Diagnosis not present

## 2020-04-04 DIAGNOSIS — E785 Hyperlipidemia, unspecified: Secondary | ICD-10-CM | POA: Diagnosis not present

## 2020-04-04 DIAGNOSIS — I498 Other specified cardiac arrhythmias: Secondary | ICD-10-CM | POA: Diagnosis not present

## 2020-04-04 DIAGNOSIS — R002 Palpitations: Secondary | ICD-10-CM | POA: Diagnosis not present

## 2020-04-06 ENCOUNTER — Telehealth: Payer: Self-pay

## 2020-04-06 NOTE — Telephone Encounter (Signed)
Received a fax from [facility] for clarification regarding pt's metoprolol dose. Current order is for Metoprolol 25 mg BID. Per last office note on 06/23/19 and 10/06/19 ER visit, metoprolol dose is prescribed as 25 mg in the morning and 12.5 mg at night.  Called pt to clarify what he is actually taking. Left message to call back.

## 2020-04-23 DIAGNOSIS — Z139 Encounter for screening, unspecified: Secondary | ICD-10-CM | POA: Diagnosis not present

## 2021-09-02 DEATH — deceased
